# Patient Record
Sex: Male | Born: 1950 | Race: Black or African American | Hispanic: No | Marital: Single | Smoking: Former smoker
Health system: Southern US, Community
[De-identification: ages and names within clinical notes are randomized; demographics above are authoritative.]

## PROBLEM LIST (undated history)

## (undated) DIAGNOSIS — N185 Chronic kidney disease, stage 5: Secondary | ICD-10-CM

## (undated) DIAGNOSIS — I480 Paroxysmal atrial fibrillation: Secondary | ICD-10-CM

## (undated) DIAGNOSIS — E785 Hyperlipidemia, unspecified: Secondary | ICD-10-CM

## (undated) DIAGNOSIS — I120 Hypertensive chronic kidney disease with stage 5 chronic kidney disease or end stage renal disease: Secondary | ICD-10-CM

## (undated) DIAGNOSIS — E119 Type 2 diabetes mellitus without complications: Secondary | ICD-10-CM

## (undated) DIAGNOSIS — K219 Gastro-esophageal reflux disease without esophagitis: Secondary | ICD-10-CM

## (undated) DIAGNOSIS — I10 Essential (primary) hypertension: Secondary | ICD-10-CM

## (undated) DIAGNOSIS — M1990 Unspecified osteoarthritis, unspecified site: Secondary | ICD-10-CM

## (undated) HISTORY — PX: CATARACT EXTRACTION W/ INTRAOCULAR LENS IMPLANT: SHX1309

## (undated) HISTORY — PX: COLONOSCOPY: SHX174

## (undated) HISTORY — DX: Type 2 diabetes mellitus without complications: E11.9

## (undated) HISTORY — PX: BACK SURGERY: SHX140

---

## 1997-09-04 ENCOUNTER — Other Ambulatory Visit: Admission: RE | Admit: 1997-09-04 | Discharge: 1997-09-04 | Payer: Self-pay | Admitting: Family Medicine

## 1997-10-04 ENCOUNTER — Emergency Department (HOSPITAL_COMMUNITY): Admission: EM | Admit: 1997-10-04 | Discharge: 1997-10-04 | Payer: Self-pay | Admitting: Emergency Medicine

## 2000-08-15 ENCOUNTER — Encounter: Payer: Self-pay | Admitting: Family Medicine

## 2000-08-15 ENCOUNTER — Ambulatory Visit (HOSPITAL_COMMUNITY): Admission: RE | Admit: 2000-08-15 | Discharge: 2000-08-15 | Payer: Self-pay | Admitting: Family Medicine

## 2000-08-28 ENCOUNTER — Ambulatory Visit (HOSPITAL_COMMUNITY): Admission: RE | Admit: 2000-08-28 | Discharge: 2000-08-28 | Payer: Self-pay | Admitting: Family Medicine

## 2000-08-28 ENCOUNTER — Encounter: Payer: Self-pay | Admitting: Family Medicine

## 2000-11-07 ENCOUNTER — Emergency Department (HOSPITAL_COMMUNITY): Admission: EM | Admit: 2000-11-07 | Discharge: 2000-11-07 | Payer: Self-pay

## 2001-08-14 ENCOUNTER — Encounter: Payer: Self-pay | Admitting: Neurosurgery

## 2001-08-14 ENCOUNTER — Encounter: Admission: RE | Admit: 2001-08-14 | Discharge: 2001-08-14 | Payer: Self-pay | Admitting: Neurosurgery

## 2001-08-28 ENCOUNTER — Encounter: Admission: RE | Admit: 2001-08-28 | Discharge: 2001-08-28 | Payer: Self-pay | Admitting: Neurosurgery

## 2001-08-28 ENCOUNTER — Encounter: Payer: Self-pay | Admitting: Neurosurgery

## 2001-10-04 ENCOUNTER — Encounter: Payer: Self-pay | Admitting: Neurosurgery

## 2001-10-05 ENCOUNTER — Observation Stay (HOSPITAL_COMMUNITY): Admission: RE | Admit: 2001-10-05 | Discharge: 2001-10-06 | Payer: Self-pay | Admitting: Neurosurgery

## 2001-10-05 ENCOUNTER — Encounter: Payer: Self-pay | Admitting: Neurosurgery

## 2002-08-02 ENCOUNTER — Ambulatory Visit (HOSPITAL_COMMUNITY): Admission: RE | Admit: 2002-08-02 | Discharge: 2002-08-02 | Payer: Self-pay | Admitting: Internal Medicine

## 2002-08-02 ENCOUNTER — Encounter: Payer: Self-pay | Admitting: Internal Medicine

## 2002-08-20 ENCOUNTER — Ambulatory Visit (HOSPITAL_COMMUNITY): Admission: RE | Admit: 2002-08-20 | Discharge: 2002-08-20 | Payer: Self-pay | Admitting: Gastroenterology

## 2002-08-31 ENCOUNTER — Ambulatory Visit (HOSPITAL_COMMUNITY): Admission: RE | Admit: 2002-08-31 | Discharge: 2002-08-31 | Payer: Self-pay | Admitting: Neurosurgery

## 2002-08-31 ENCOUNTER — Encounter: Payer: Self-pay | Admitting: Neurosurgery

## 2003-02-20 ENCOUNTER — Ambulatory Visit (HOSPITAL_COMMUNITY): Admission: RE | Admit: 2003-02-20 | Discharge: 2003-02-20 | Payer: Self-pay | Admitting: Oncology

## 2003-04-24 ENCOUNTER — Encounter (INDEPENDENT_AMBULATORY_CARE_PROVIDER_SITE_OTHER): Payer: Self-pay | Admitting: Specialist

## 2003-04-24 ENCOUNTER — Other Ambulatory Visit: Admission: RE | Admit: 2003-04-24 | Discharge: 2003-04-24 | Payer: Self-pay | Admitting: Oncology

## 2003-05-14 ENCOUNTER — Encounter (INDEPENDENT_AMBULATORY_CARE_PROVIDER_SITE_OTHER): Payer: Self-pay | Admitting: Specialist

## 2003-05-14 ENCOUNTER — Ambulatory Visit (HOSPITAL_COMMUNITY): Admission: RE | Admit: 2003-05-14 | Discharge: 2003-05-14 | Payer: Self-pay | Admitting: Oncology

## 2003-12-10 ENCOUNTER — Encounter: Admission: RE | Admit: 2003-12-10 | Discharge: 2003-12-10 | Payer: Self-pay | Admitting: Nephrology

## 2004-03-03 ENCOUNTER — Ambulatory Visit: Payer: Self-pay | Admitting: Nurse Practitioner

## 2004-05-05 ENCOUNTER — Encounter: Admission: RE | Admit: 2004-05-05 | Discharge: 2004-05-05 | Payer: Self-pay | Admitting: Nephrology

## 2004-05-14 ENCOUNTER — Ambulatory Visit: Payer: Self-pay | Admitting: Oncology

## 2004-07-01 ENCOUNTER — Encounter: Admission: RE | Admit: 2004-07-01 | Discharge: 2004-07-01 | Payer: Self-pay | Admitting: Nephrology

## 2005-02-15 ENCOUNTER — Ambulatory Visit (HOSPITAL_COMMUNITY): Admission: RE | Admit: 2005-02-15 | Discharge: 2005-02-15 | Payer: Self-pay | Admitting: Nephrology

## 2005-02-18 ENCOUNTER — Ambulatory Visit (HOSPITAL_COMMUNITY): Admission: RE | Admit: 2005-02-18 | Discharge: 2005-02-18 | Payer: Self-pay | Admitting: Nephrology

## 2005-02-22 ENCOUNTER — Ambulatory Visit (HOSPITAL_COMMUNITY): Admission: RE | Admit: 2005-02-22 | Discharge: 2005-02-22 | Payer: Self-pay | Admitting: Nephrology

## 2005-05-02 ENCOUNTER — Emergency Department (HOSPITAL_COMMUNITY): Admission: EM | Admit: 2005-05-02 | Discharge: 2005-05-02 | Payer: Self-pay | Admitting: Emergency Medicine

## 2005-05-18 ENCOUNTER — Emergency Department (HOSPITAL_COMMUNITY): Admission: EM | Admit: 2005-05-18 | Discharge: 2005-05-19 | Payer: Self-pay | Admitting: Emergency Medicine

## 2005-06-06 ENCOUNTER — Ambulatory Visit (HOSPITAL_COMMUNITY): Admission: RE | Admit: 2005-06-06 | Discharge: 2005-06-06 | Payer: Self-pay | Admitting: Gastroenterology

## 2005-06-06 ENCOUNTER — Encounter (INDEPENDENT_AMBULATORY_CARE_PROVIDER_SITE_OTHER): Payer: Self-pay | Admitting: *Deleted

## 2006-02-21 ENCOUNTER — Emergency Department (HOSPITAL_COMMUNITY): Admission: EM | Admit: 2006-02-21 | Discharge: 2006-02-21 | Payer: Self-pay | Admitting: Emergency Medicine

## 2006-03-31 ENCOUNTER — Encounter: Admission: RE | Admit: 2006-03-31 | Discharge: 2006-03-31 | Payer: Self-pay | Admitting: Nephrology

## 2007-03-11 IMAGING — CR DG KNEE 1-2V*R*
3 series · 3 of 3 positions shown · non-contrast
Comparison: None.

RIGHT KNEE - 2 VIEW:

CLINICAL DATA: Right knee pain

[t knee ap right]
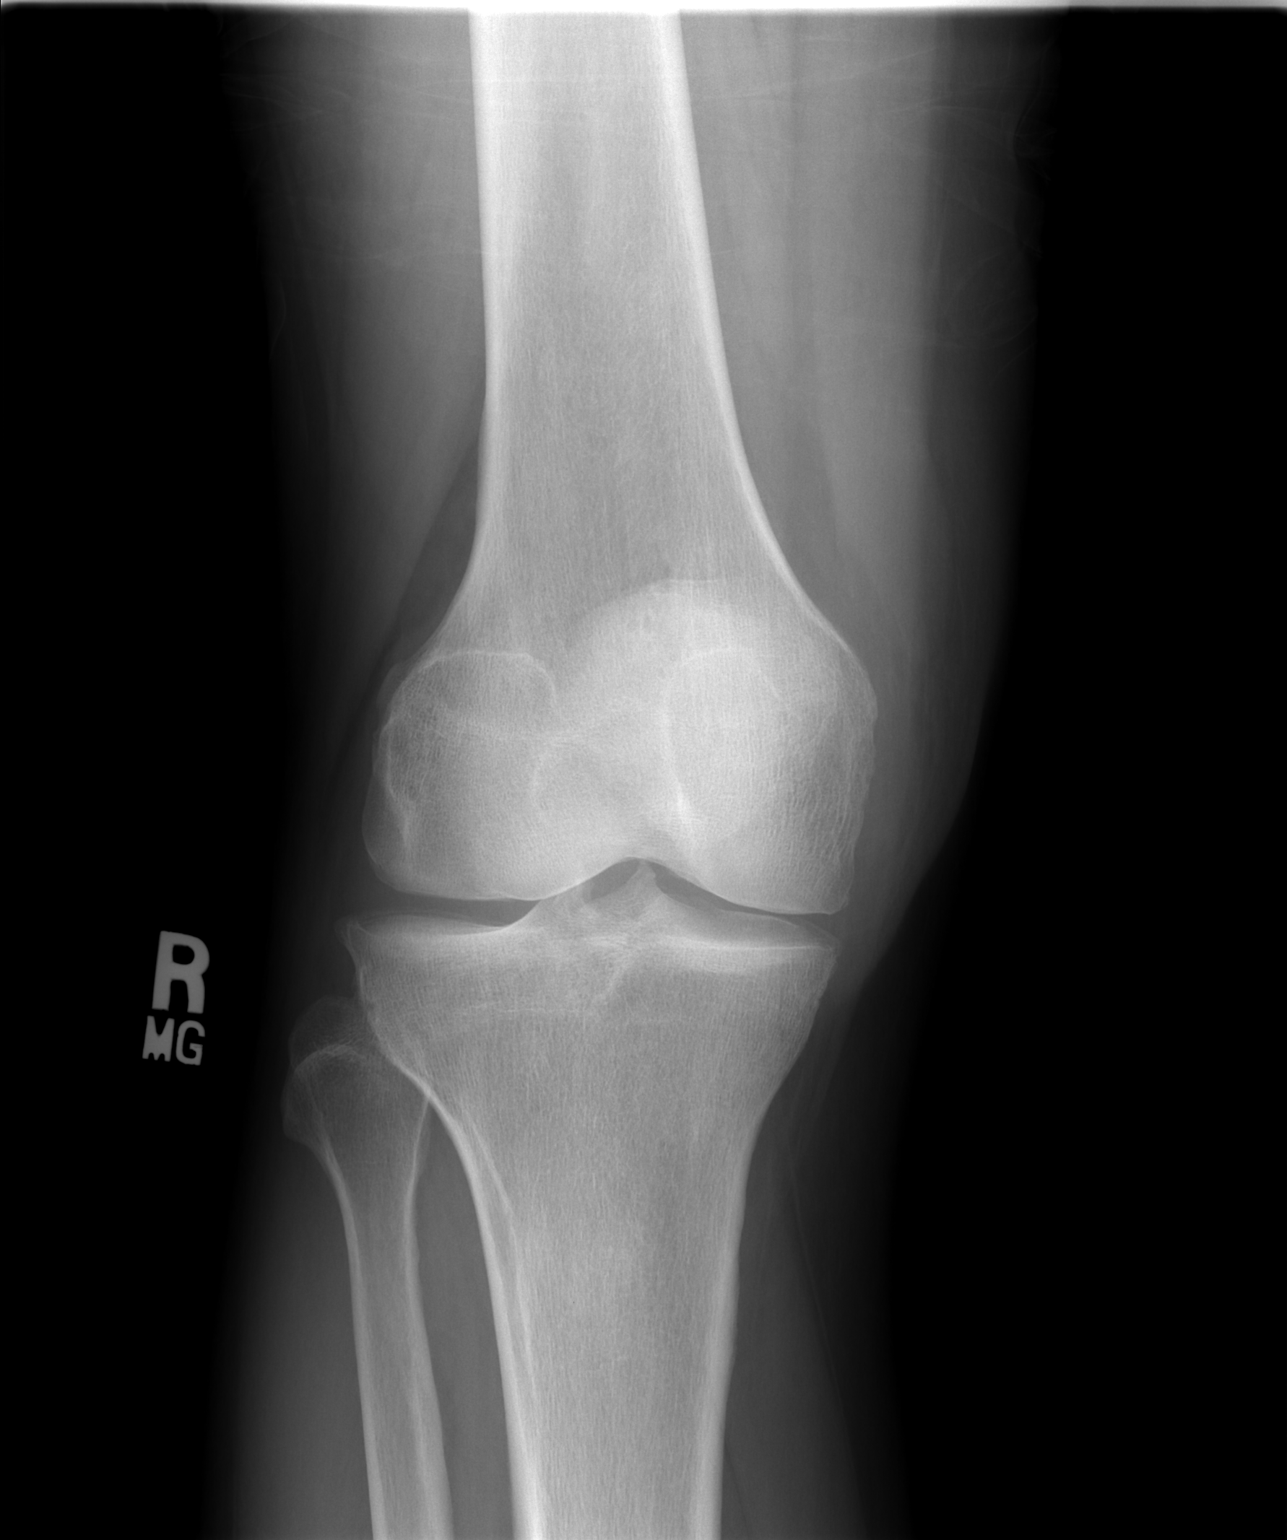

[t knee lat right (1 of 2)]
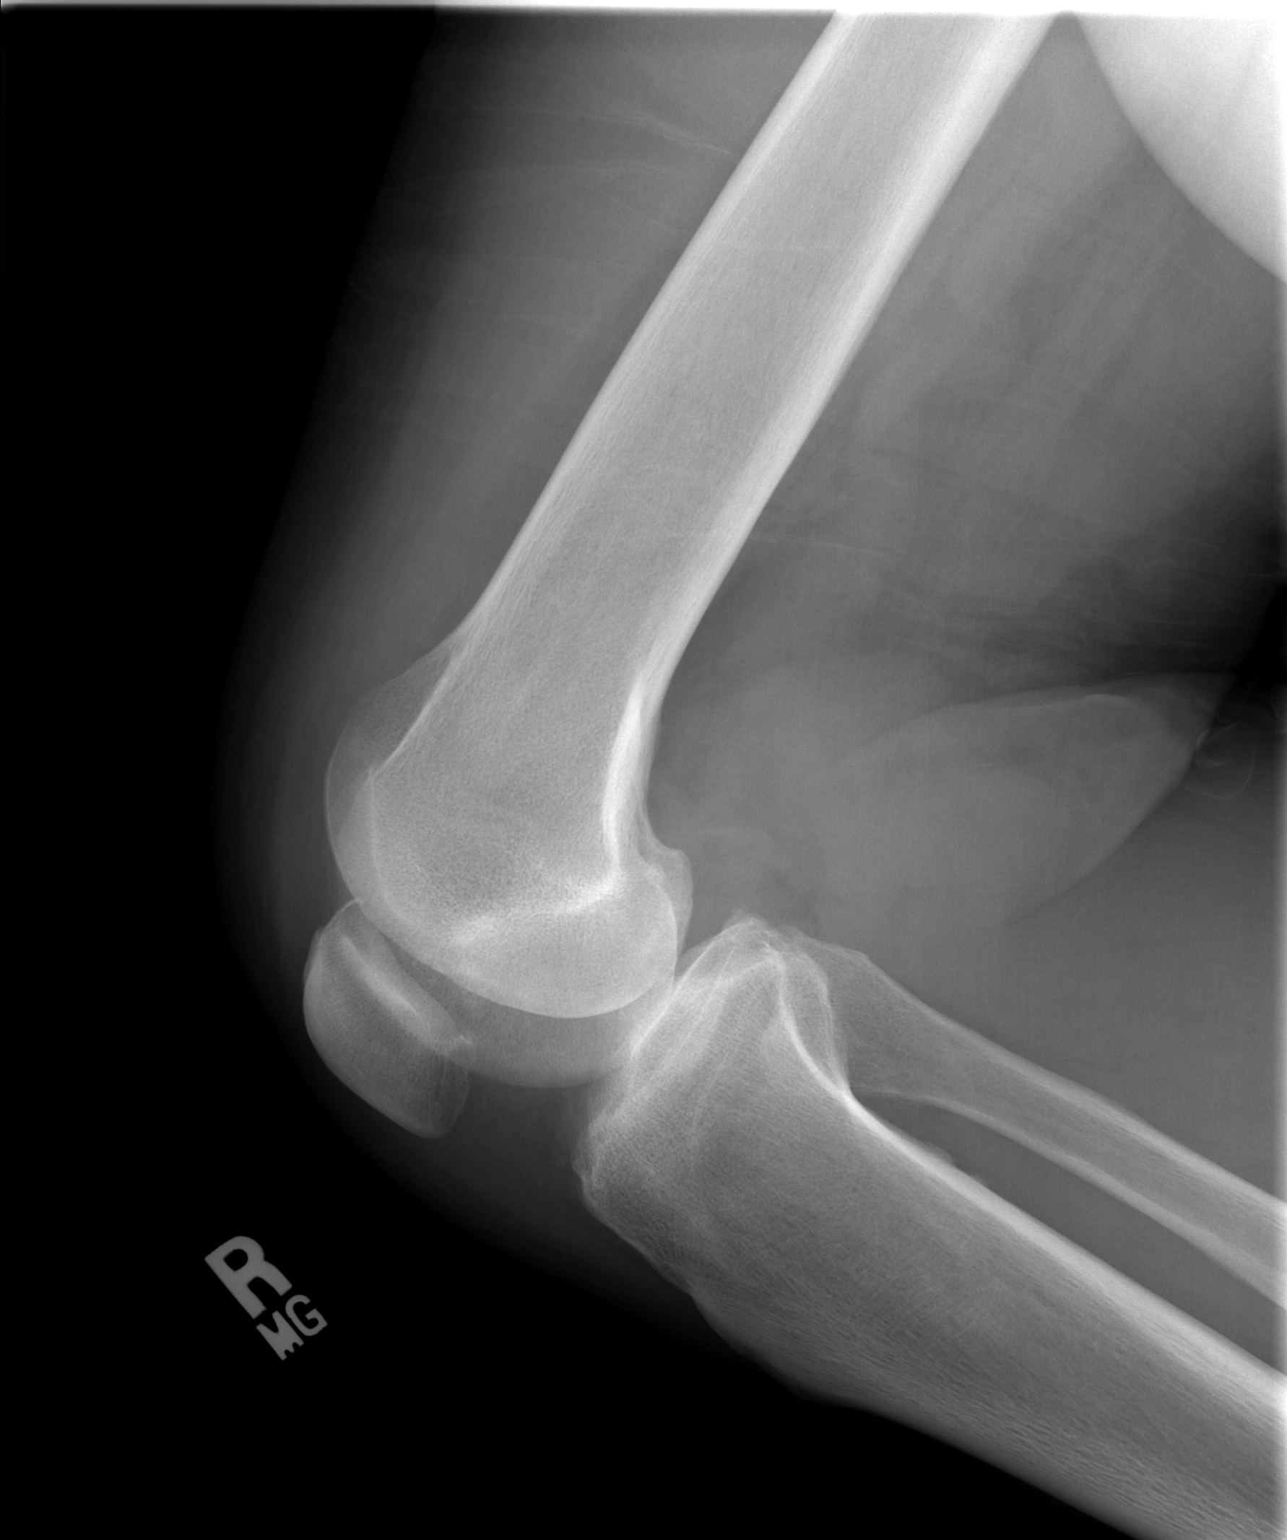

[t knee lat right (2 of 2)]
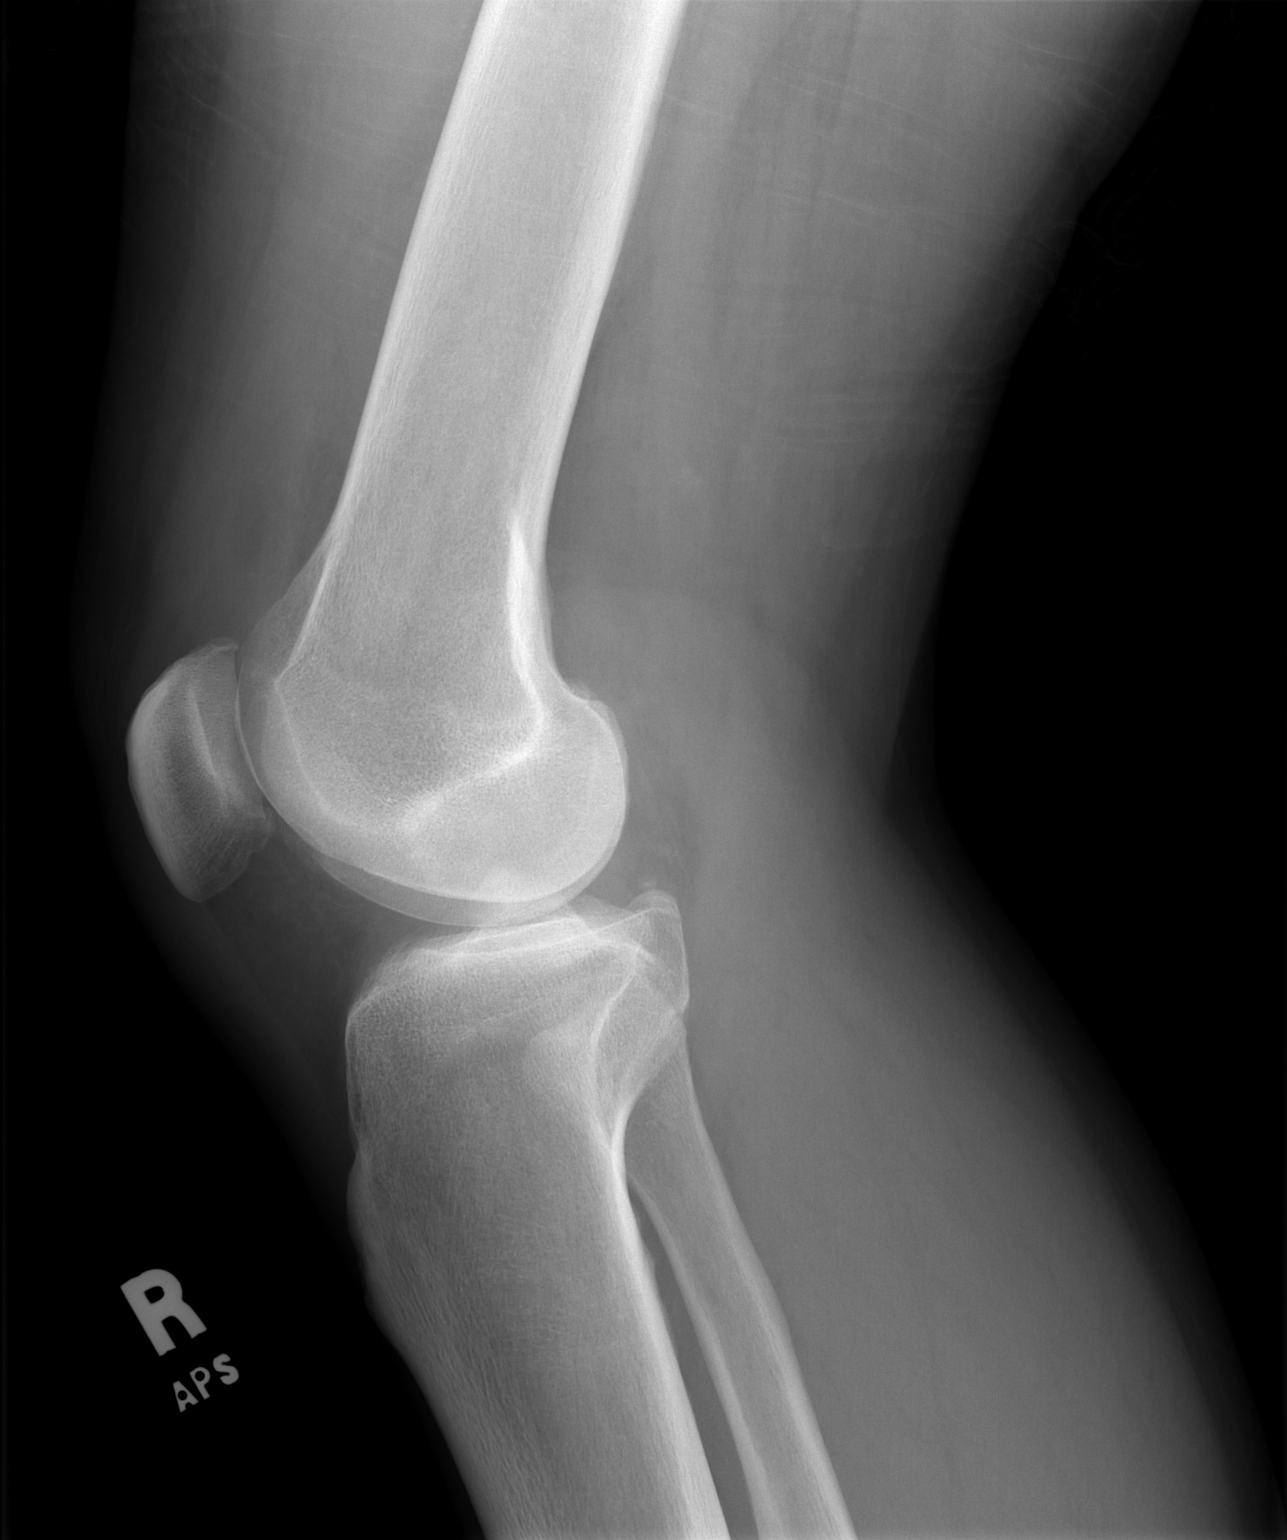

[3 of 3 positions shown; findings below may reference images not displayed]

FINDINGS: No evidence for acute fracture. No joint effusion. Tricompartmental
spurring is apparent.
IMPRESSION: Tricompartmental degenerative change. No acute bony findings.

## 2007-04-30 ENCOUNTER — Encounter: Admission: RE | Admit: 2007-04-30 | Discharge: 2007-04-30 | Payer: Self-pay | Admitting: Nephrology

## 2007-06-22 ENCOUNTER — Inpatient Hospital Stay (HOSPITAL_COMMUNITY): Admission: EM | Admit: 2007-06-22 | Discharge: 2007-06-27 | Payer: Self-pay | Admitting: Emergency Medicine

## 2007-06-26 ENCOUNTER — Encounter (INDEPENDENT_AMBULATORY_CARE_PROVIDER_SITE_OTHER): Payer: Self-pay | Admitting: Interventional Radiology

## 2007-12-06 ENCOUNTER — Inpatient Hospital Stay (HOSPITAL_COMMUNITY): Admission: EM | Admit: 2007-12-06 | Discharge: 2007-12-10 | Payer: Self-pay | Admitting: Emergency Medicine

## 2008-01-17 ENCOUNTER — Emergency Department (HOSPITAL_COMMUNITY): Admission: EM | Admit: 2008-01-17 | Discharge: 2008-01-17 | Payer: Self-pay | Admitting: Emergency Medicine

## 2008-03-06 ENCOUNTER — Emergency Department (HOSPITAL_COMMUNITY): Admission: EM | Admit: 2008-03-06 | Discharge: 2008-03-06 | Payer: Self-pay | Admitting: Emergency Medicine

## 2008-12-11 ENCOUNTER — Encounter: Admission: RE | Admit: 2008-12-11 | Discharge: 2008-12-11 | Payer: Self-pay | Admitting: Nephrology

## 2008-12-27 IMAGING — CR DG CHEST 2V
2 series · 2 of 2 positions shown · non-contrast
Comparison: 12/06/2007

CLINICAL DATA: Shortness of breath

CHEST - 2 VIEW

[w chest pa]
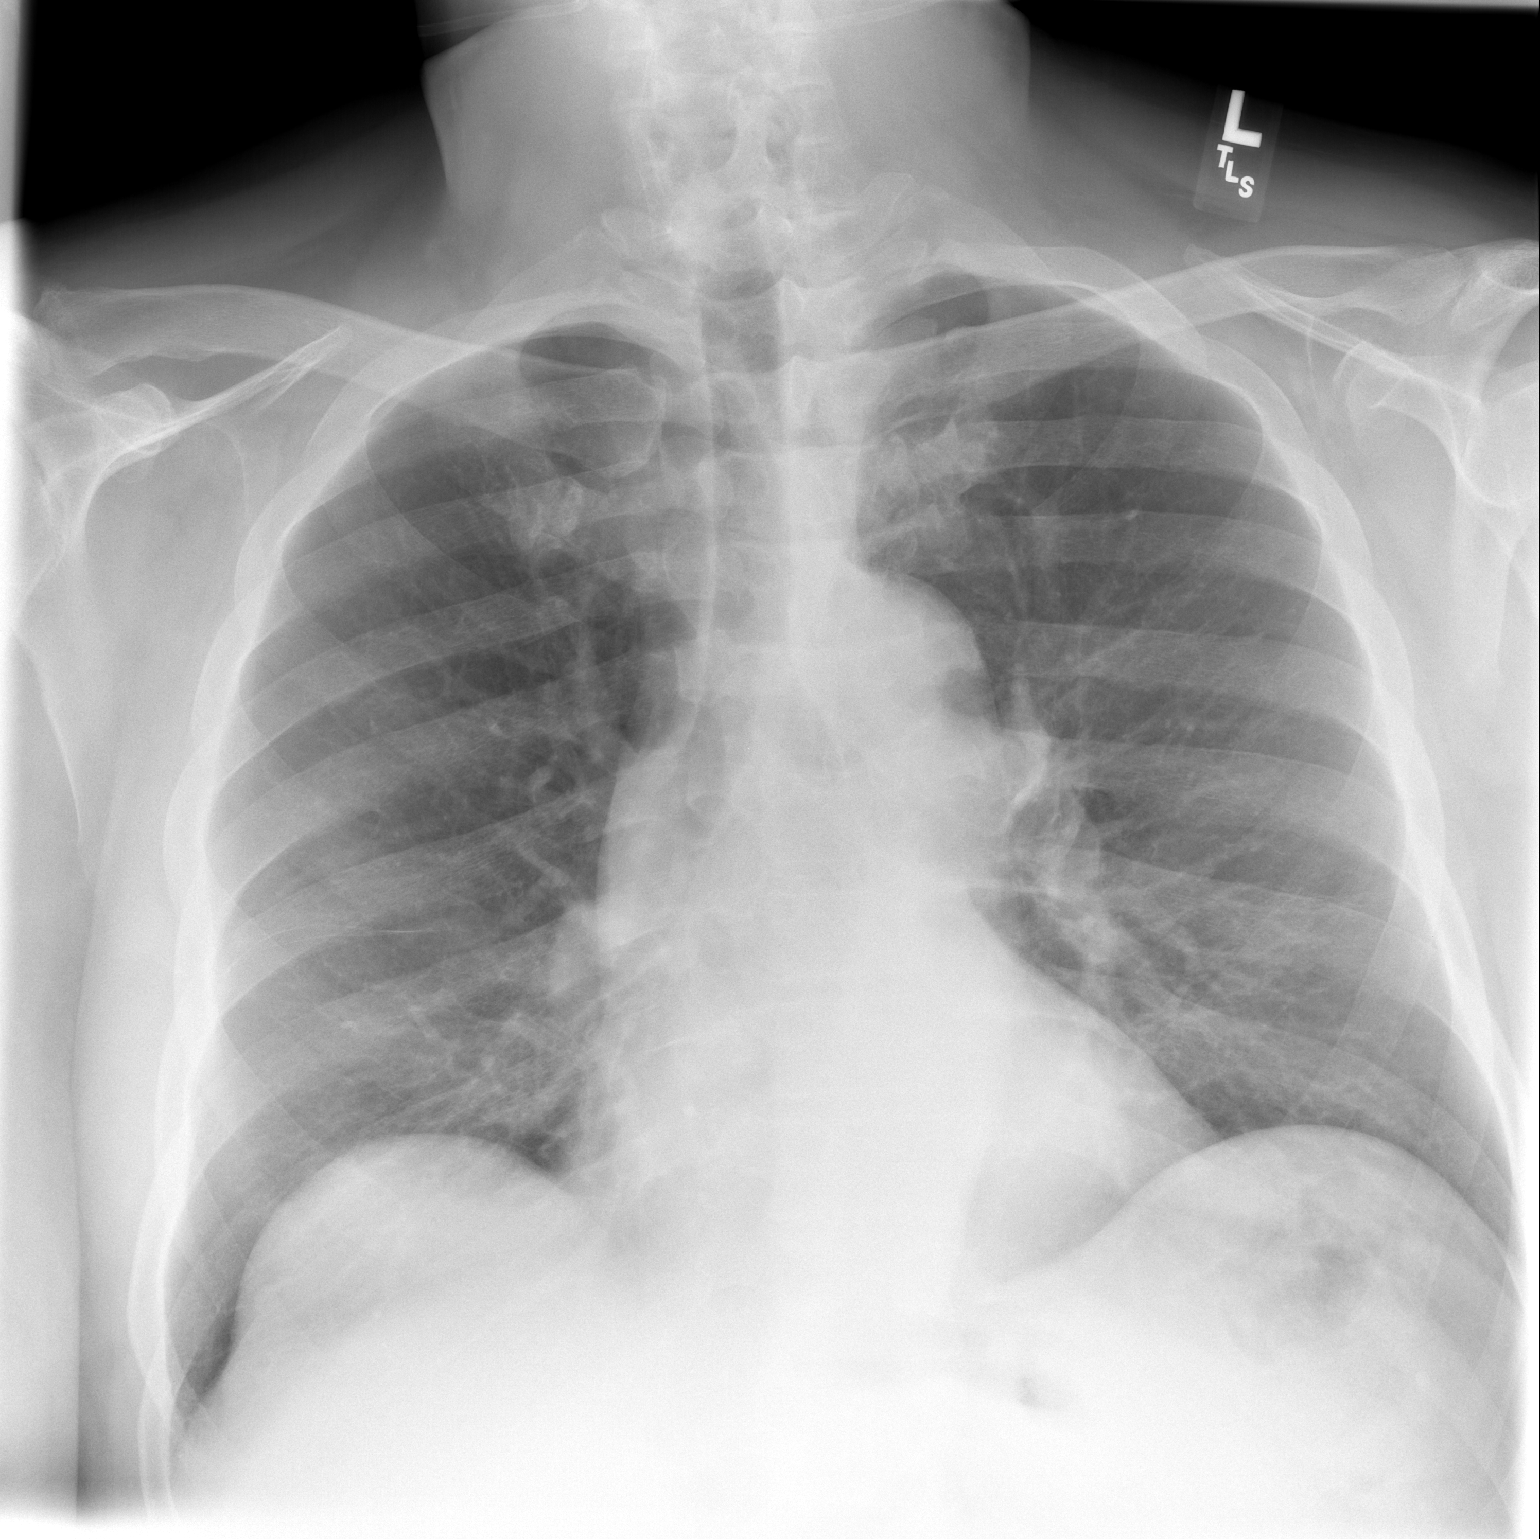

[w chest lat *]
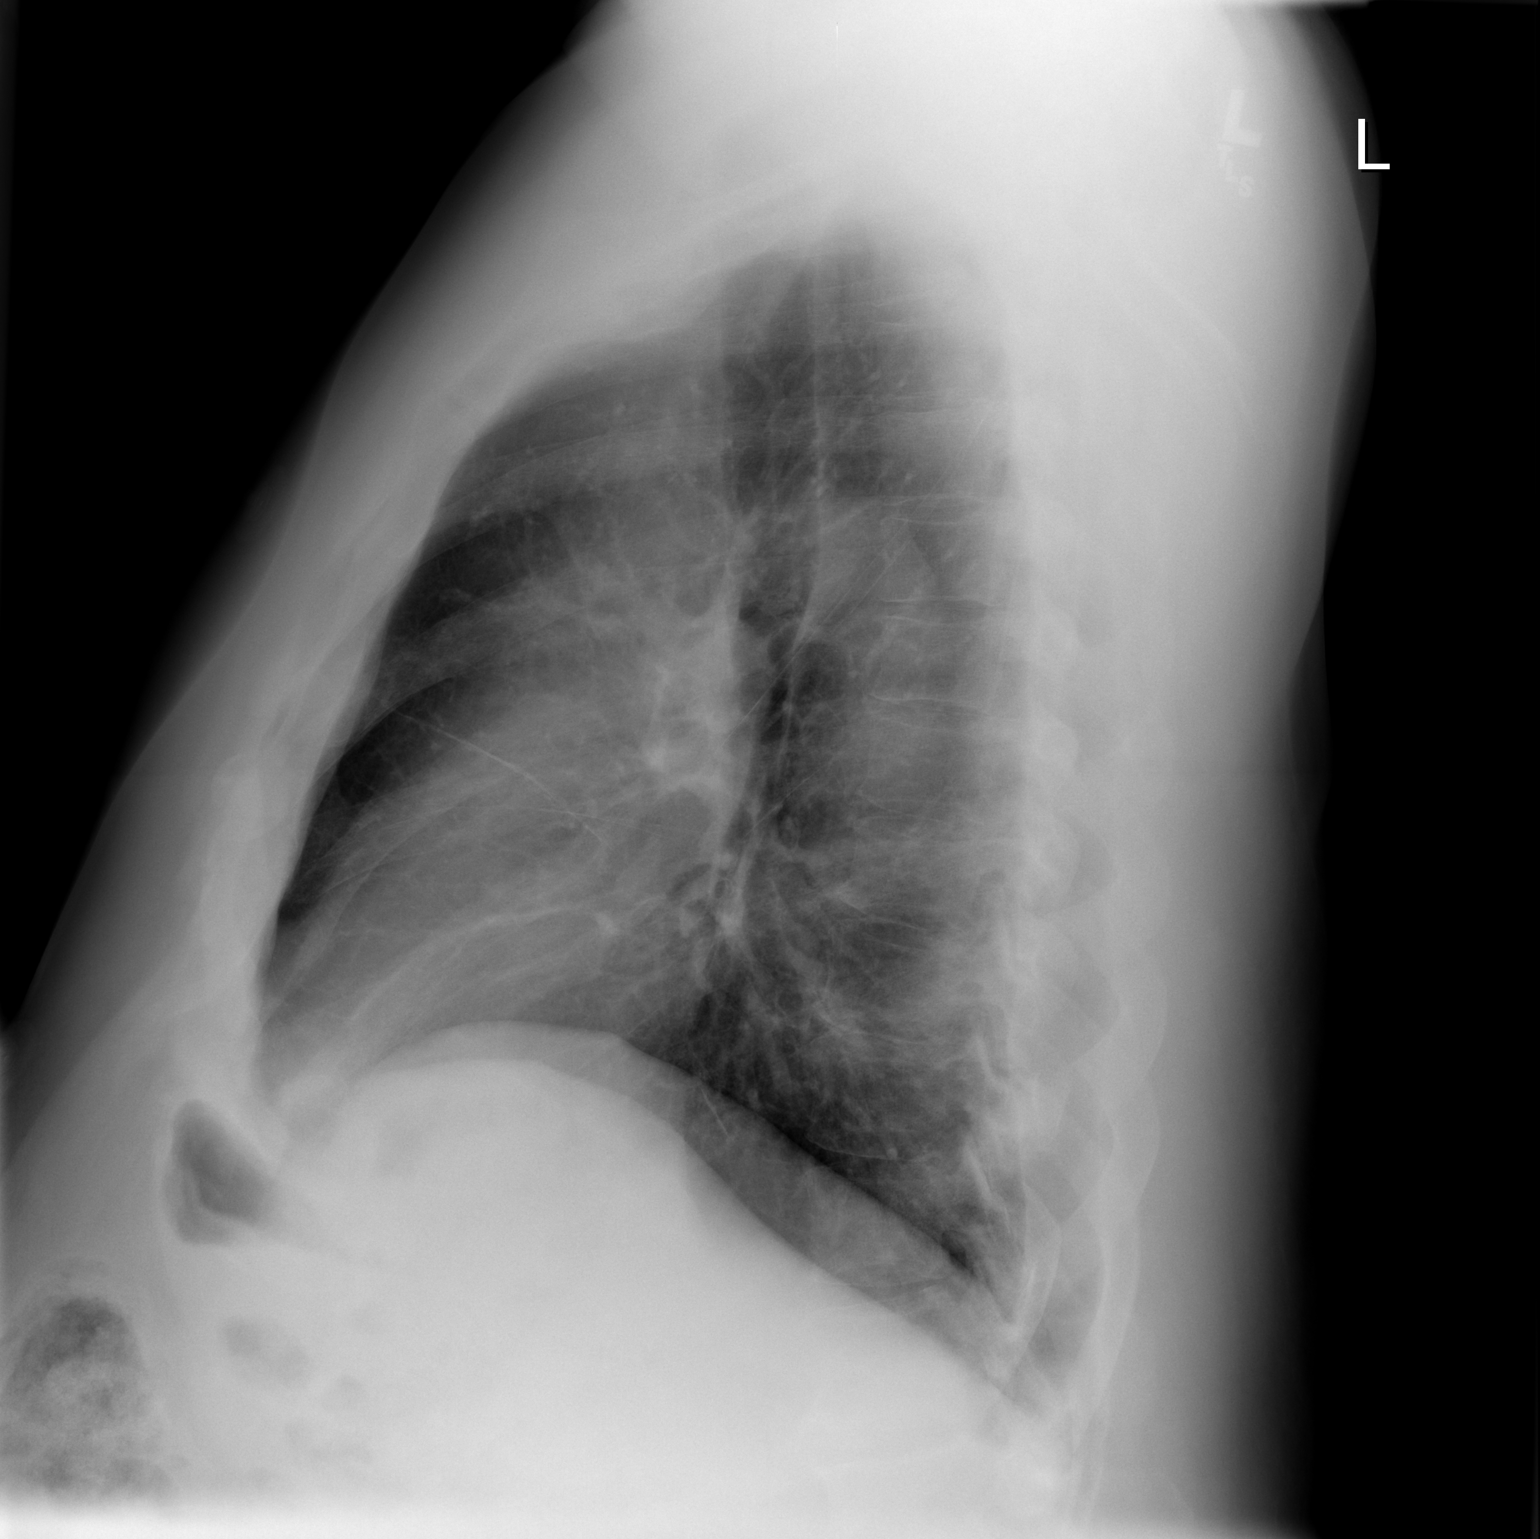

[2 of 2 positions shown; findings below may reference images not displayed]

FINDINGS: The cardiac silhouette, mediastinal and hilar contours
are stable.  There is tortuosity and ectasia of the thoracic aorta.
There are mild chronic lung changes but no acute pulmonary
findings.  Bony thorax is intact.
IMPRESSION: 1.  Chronic lung changes.
2.  No acute pulmonary findings.

## 2009-07-23 ENCOUNTER — Encounter: Admission: RE | Admit: 2009-07-23 | Discharge: 2009-07-23 | Payer: Self-pay | Admitting: Nephrology

## 2010-02-28 ENCOUNTER — Encounter: Payer: Self-pay | Admitting: Nephrology

## 2010-04-27 ENCOUNTER — Other Ambulatory Visit: Payer: Self-pay | Admitting: Nephrology

## 2010-04-27 DIAGNOSIS — N289 Disorder of kidney and ureter, unspecified: Secondary | ICD-10-CM

## 2010-04-28 ENCOUNTER — Ambulatory Visit
Admission: RE | Admit: 2010-04-28 | Discharge: 2010-04-28 | Disposition: A | Discharge status: Home / Self Care | Payer: Medicare Other | Source: Ambulatory Visit | Attending: Nephrology | Admitting: Nephrology

## 2010-04-28 DIAGNOSIS — N289 Disorder of kidney and ureter, unspecified: Secondary | ICD-10-CM

## 2010-06-22 NOTE — Discharge Summary (Signed)
NAME:  Miguel [name], Miguel [name] NO.:  [id number]   MEDICAL RECORD NO.:  [id number]          PATIENT TYPE:  INP   LOCATION:  3708                         FACILITY:  [facility]   PHYSICIAN:  Jarome Matin, M.D.DATE OF BIRTH:  September 17, 1950   DATE OF ADMISSION:  12/06/2007  DATE OF DISCHARGE:  12/10/2007                               DISCHARGE SUMMARY   ADMITTING DIAGNOSES:  Chest tightness and shortness of breath.   DISCHARGE DIAGNOSES:  1. Chest tightness.  2. Atrial fibrillation.  3. Shortness of breath.  4. Acute on chronic renal failure.  5. Hypo-osmolality.  6. Type 2 diabetes mellitus.  7. Congestive heart failure.  8. Constipation.  9. Chronic renal disease.  10.Hypertensive renal disease.  11.Osteoarthritis.  12.Reflux esophagitis.   BRIEF HISTORY AND PHYSICAL AND HOSPITAL COURSE:  [name] is a 57-year-  old African American male with hypertension and chronic renal disease.  He lives at home alone.  He has a history of chest tightness and  shortness of breath.  He was brought to the emergency room by EMS.  He  was found to be in atrial fibrillation.  He received adenosine 12 mg IV  and converted to normal sinus rhythm.  Chest pain improved.  He has  diabetes.  He had a brother, who was on dialysis, who had chronic renal  disease and diabetes, who has deceased.  The patient had some evidence  of congestive failure and diabetes.  He had been on metformin,  lisinopril, Lasix, Oruvail for his arthritis, some Reglan for his  reflux.  Physical exam reveals a blood pressure of 151/105, temp of  97.6, pulse of 104, respirations 16, and he was given some Lasix, and he  was feeling better at the time we examined.  His chest pain was down.  His shortness of breath was improved.  His chest was clear, had  occasional rhonchi.  Abdomen was soft.  No masses.  No organomegaly.  He  had active bowel sounds.  He can move all his extremities.   PERTINENT LABORATORY DATA:   Sodium was 129.  We typed him for CK-MB;  however, that was normal.  Increased troponins.  Cardiac markers, he did  have __________ his CK would be elevated.  Some muscle pain.  He  frequently had slightly elevated uric acids, so we kept him on Zyloprim  and colchicine.  His BUN was 37 and creatinine was 2.6.  His white count  was 6.7.  We did a protein electrophoresis that showed monoclonal peak  in his protein.  The patient was found to have an ejection fraction of  about 54%.  He did not have an acute MI, so we decided we can go ahead  and discharge him home.  He was on metformin 500 mg daily earlier.  We  are going to switch him off metformin since he has some renal disease.  Amitriptyline 50 mg at bedtime, Reglan 10 mg before  meals and at bedtime, tramadol 50 mg once daily, allopurinol 100 mg  b.i.d., colchicine 0.6 mg daily, ramipril 1.25 mg daily, lisinopril 20  mg daily, furosemide 20 mg daily.  He was put on metoprolol 25 mg  b.i.d., and the patient was discharged home on these meds to be seen in  the office in 2 weeks.           ______________________________  Jarome Matin, M.D.     CEF/MEDQ  D:  01/14/2008  T:  01/15/2008  Job:  [id number]

## 2010-06-25 NOTE — Discharge Summary (Signed)
NAME:  GUILHERME, SCHWENKE NO.:  [id number]   MEDICAL RECORD NO.:  [id number]          PATIENT TYPE:  INP   LOCATION:  6735                         FACILITY:  [facility]   PHYSICIAN:  Jarome Matin, M.D.DATE OF BIRTH:  1950/08/23   DATE OF ADMISSION:  06/22/2007  DATE OF DISCHARGE:  06/27/2007                               DISCHARGE SUMMARY   ADMITTING DIAGNOSIS:  Dizziness while driving and almost blacking out.   DISCHARGE DIAGNOSES:  1. Hypertension.  2. Diabetes mellitus type 2.  3. Gouty arthritis.  4. Reflux esophagitis.  5. Chronic renal disease.   BRIEF HISTORY AND PHYSICAL AND HOSPITAL COURSE:  Mr. Theil is a 60-year-  old African American male who states that he felt very dizzy while  driving his automobile.  He did not have chest pain.  He almost blacked  out.  He went to the emergency room.  He was found to have a blood  pressure of 56/39.  Well, actually I think he must have gone home  because he was brought to the emergency room in an ambulance.  The  patient is divorced.  He has a history of diabetes, hypertension, and  gouty arthritis.  He had a brother who was on dialysis, had deceased.  He lives in a house and has some housemates.   PHYSICAL EXAMINATION:  VITAL SIGNS:  Temperature 97.3, pulse rate 84,  blood pressure 56/37, and respirations 28, and then down to 12.  HEENT:  He had a very poor dentition.  NECK:  Neck veins were flat.  CHEST:  Clear to auscultation and percussion.  CARDIAC:  Regular sinus rhythm.  ABDOMEN:  Soft.  No mass.  No organomegaly.  He had bowel sounds.  EXTREMITIES:  Could move all the extremities and reflexes are  bilaterally equal.   LABORATORY DATA:  BUN was 41, creatinine of 3.91, hemoglobin 11.5.  He  had no leg edema.   He had been on meds for hypertension; Catapres, Lasix, and lisinopril.  For diabetes, he had been on metformin; however, with elevation of his  creatinine, we are going to have to change that.   Also for gout, he has  been on allopurinol and colchicine.  He has meds for arthritis,  Clinoril, and Reglan for his reflux esophagitis.  Put him on bed rest  with his blood pressure down until his pressure came up and stopped all  of his blood pressure medicines.  Gradually, his pressure came up  118/65, pulse of 70, respirations 18, and O2 sats 97.  Hemoglobin 11.5,  hematocrit 33.5, and white count was 6.7.  His creatinine went up to  4.01, BUN of 44, uric acid 7.7.  Gradually, the patient started voiding  better and his eating improved.  His renal function is stable.  We are  going to get a creatinine clearance and a renal biopsy.  Gradually, the  patient became more alert and oriented.  Felt better and was eating  better.  We stopped all of his pressors.  As his blood pressure came up,  his renal function improved, went up to  4.01 creatinine and now it was  back down to 2.15.  His clearance we got was about 44 mL per minute and  went up to near 50 mL per minute.  Temperature 98, pulse 67,  respirations 18, blood pressure 137/93, and 96% on room air.  Ultrasound  of the patient's kidneys and __________ were normal.  We told him we  would go ahead and get a renal biopsy while the patient was in the  hospital.  He had a biopsy with ultrasound-guided biopsy  lower pole of  left kidney.  He had a small complex cyst which showed possibly cystic  mass in the right kidney.  Probably get an ultrasound in 3-6 months of  that right kidney.  Gradually the patient was feeling fine.  His BUN was  down to 22, creatinine down to 1.89, hemoglobin 11.9, hematocrit of  34.8, and white count 7.2.  The patient was hydrated, his renal function  was about back at baseline, and we have decided to go ahead and get the  results of his renal biopsy as an outpatient.  We discharged the patient  home on Reglan and Prilosec for his reflux, and no blood pressure meds  at this time.  We will see him back in the  office in 2-3 weeks.           ______________________________  Jarome Matin, M.D.     CEF/MEDQ  D:  08/22/2007  T:  08/23/2007  Job:  [id number]

## 2010-06-25 NOTE — Op Note (Signed)
NAME:  Miguel [name], Miguel [name] NO.:  [id number]   MEDICAL RECORD NO.:  [id number]          PATIENT TYPE:  AMB   LOCATION:  ENDO                         FACILITY:  [facility]   PHYSICIAN:  Anselmo Rod, M.D.  DATE OF BIRTH:  [birth year]   DATE OF PROCEDURE:  06/06/2005  DATE OF DISCHARGE:                                 OPERATIVE REPORT   PROCEDURE PERFORMED:  Colonoscopy with snare polypectomy x6 and cold  biopsies x3.   ENDOSCOPIST:  Anselmo Rod, M.D.   INSTRUMENT USED:  Olympus video colonoscope.   INDICATIONS FOR PROCEDURE:  The patient is a 60 year old African-American  male with a history of abnormal weight loss and rectal bleeding with chronic  constipation undergoing a screening colonoscopy to rule out colonic polyps,  masses, etc.   PREPROCEDURE PREPARATION:  Informed consent was procured from the patient.  The patient was fasted for four hours prior to the procedure and prepped  with a bottle of magnesium citrate and a gallon of GoLytely the night prior  to the procedure.  The risks and benefits of the procedure including a 10%  miss rate for cancer or polyps was discussed with the patient as well.   PREPROCEDURE PHYSICAL:  The patient had stable vital signs.  Neck supple.  Chest clear to auscultation.  S1 and S2 regular.  Abdomen soft with normal  bowel sounds.   DESCRIPTION OF PROCEDURE:  The patient was placed in left lateral decubitus  position and sedated with 75 mcg of fentanyl and 7.5 mg of Versed in slow  incremental doses.  Once the patient was adequately sedated and maintained  on low flow oxygen and continuous cardiac monitoring, the Olympus video  colonoscope was advanced from the rectum to the cecum with extreme  difficulty.  The patient's position was changed from the left lateral to the  supine and the right lateral position to mobilize the large amount of stool  especially in the right colon and cecum.  With gentle application of  abdominal pressure we reached the cecal base.  The appendicular orifice and  ileocecal valve were visualized and photographed.  The terminal ileum  appeared normal.  There was no evidence of diverticulosis.  Multiple washes  were done.  Small lesions could be missed.  Six small sessile polyps were  removed by hot snare from the rectum along with three other polyps removed  by cold biopsy.  The settings were 20/200.  The patient tolerated the  procedure well without complication.  Prep was suboptimal and therefore  small lesions could have been missed.   IMPRESSION:  1.  Six small sessile polyps removed by hot snare from the rectum.  2.  Three small sessile polyps biopsied from the rectum.  3.  Large amount of residual stool in the colon.  Small lesions could have      been missed.  Normal-appearing transverse colon, right colon and cecum.   RECOMMENDATIONS:  1.  Await pathology results.  2.  Avoid all nonsteroidals including aspirin for the next two weeks.  3.  Outpatient followup in the next two  weeks for further recommendations.      Anselmo Rod, M.D.  Electronically Signed     JNM/MEDQ  D:  06/06/2005  T:  06/07/2005  Job:  [id number]   cc:   Jarome Matin, M.D.  Fax: [phone]

## 2010-06-25 NOTE — Op Note (Signed)
NAME:  RAEKWON, Miguel [name]                        ACCOUNT NO.:  [id number]   MEDICAL RECORD NO.:  [id number]                   PATIENT TYPE:  INP   LOCATION:  3013                                 FACILITY:  [facility]   PHYSICIAN:  Kyle L. Franky Macho, M.D.               DATE OF BIRTH:  Nov 19, 1950   DATE OF PROCEDURE:  10/05/2001  DATE OF DISCHARGE:  10/06/2001                                 OPERATIVE REPORT   PREOPERATIVE DIAGNOSES:  1. Displaced cyst, right far lateral, L4-L5.  2. L5 radiculopathy.   POSTOPERATIVE DIAGNOSES:  1. Displaced cyst, right far lateral, L4-L5.  2. L5 radiculopathy.   PROCEDURE:  L4-L5 far lateral diskectomy with microdissection.   COMPLICATIONS:  None.   SURGEON:  Kyle L. Franky Macho, M.D.   ASSISTANT:  Hewitt Shorts, M.D.   INDICATIONS:  Miguel [name] is a patient whom I have followed for a number  of months.  He has severe pain in his right lower extremity and a far  lateral disk herniation at L4-L5 closing off the neural foramen.  I  recommended and he has agreed to undergo operative decompression.   ANESTHESIA:  General endotracheal.   OPERATIVE NOTE:  Mr. Sprigg was brought to the operating room, intubated and  placed under general anesthesia without difficulty.  He was rolled into a  prone position and his back was prepped and he was draped in a sterile  position.  Using preoperative x-ray, I infiltrated 10 cc of 0.5% lidocaine,  1:200,000 strength epinephrine into my proposed incision.  I made the  incision with a #10 blade and took it down to the thoracolumbar fascia.  I  then exposed the lamina of L5.  I took an x-ray and showed I was at the  correct location.  I then identified the pars intra-articularis of L4.  Using a high-speed drill I then enlarged that opening and removed bone so  that I was able to see the L4 nerve root.  Once the L4 nerve root was  identified, I brought the microscope in to aid in microscopic dissection.  Dr.  Newell Coral then assisted.   I then entered the disk space and removed disk material and also removed  some disk which was herniated.  I used a drill to remove an osteophyte which  was pushing on the L4 nerve root.  I did this both proximally and distally  until I felt that there was no compression of the nerve root.  Dr. Newell Coral  also drilled and used the surgical dynamics down instrument to remove an  osteophyte which was compressing a nerve root.  After that was done I was  satisfied with the decompression.  I then irrigated.  I then placed Depo-  Medrol into the operative site.  I then closed the wound in a layered  fashion using Vicryl sutures with the assisted of Dr. Newell Coral, the  thoracolumbar fascia initially  then the subcutaneous tissue.  The skin was reapproximated with Vicryl  sutures.  Dermabond used for a sterile dressing.  The patient tolerated the  procedure moving all extremities well postoperatively after being rolled  supine and extubated.                                               Kyle L. Franky Macho, M.D.    Luna Kitchens  D:  10/05/2001  T:  10/09/2001  Job:  [id number]

## 2010-06-25 NOTE — H&P (Signed)
NAME:  Miguel [name], Miguel [name]                        ACCOUNT NO.:  [id number]   MEDICAL RECORD NO.:  [id number]                   PATIENT TYPE:  INP   LOCATION:  3013                                 FACILITY:  [facility]   PHYSICIAN:  Kyle L. Franky Macho, M.D.               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   INDICATIONS:  The patient originally presented in October 2002.  He is a 60-  year-old gentleman who had pain in both legs which he had had since July.  He had difficulty walking.  He uses a wheelchair at times when he is at  home.  He also uses a walker when he is at home.  He has not been able to  work because he cannot walk any distance.  He finds himself weak in both  legs.  He finds that his right toes are numb.  He has had no bowel or  bladder difficulties.   PAST MEDICAL HISTORY:  Hypertension, cerebrovascular disease, one bout of  acute renal failure and he was on dialysis for a short period of time.  No  previous operations.  He does not have drug allergies.   FAMILY HISTORY:  Mother, 79, is in fair health.   SOCIAL HISTORY:  He smokes a pack of cigarettes a day.  Does drink alcohol  daily.  Lost weight recently.   REVIEW OF SYSTEMS:  He denies constitutional, eyes, ears, nose, throat,  mouth, cardiovascular, respiratory, gastrointestinal, genitourinary, skin,  neurological, psychiatric, endocrine, hematologic and allergic problems.   PHYSICAL EXAMINATION:  EXTREMITIES:  He cannot toe walk.  He is unable to  heel walk on the left.  He can do single toe raises on the right though with  difficulty.  Markedly antalgic gait.  Somewhat unsteady on his feet.  5/5  strength on manual testing.  Pain with forced flexion and extension of the  hip.  No pain with passive movement.  No pain on Patrick's maneuver.  Negative straight leg raising.  There are 2+ reflexes at the knees and  ankles.  Downgoing toes.  Plantar simulation.  No clonus.  No Hoffman sign.  There are 2+ reflexes in the upper extremities.  Normal muscle tone and  bulk.  No cervical masses or bruits.  HEENT:  Pupils equal, round and reactive to light.  Full extraocular  movements.  Tongue and uvula in the midline.  Shoulder shrug is normal.  Hearing intact to voice bilaterally.  Symmetric facial sensation and  movements.   ASSESSMENT ON LIMB MOVEMENT:  The patient came back in August stating that  he is just having a lot of problems now and that the orthopedic surgeons do  not believe hip problems are significant.  I believe the pain  is in the  lower extremities caused to some degree by the disc herniation.  He has had  epidural injections which has helped to some degree though he did not get  rid of his pain.  That being the reason,  I have planned on performing a  __________  diskectomy at L4-5 and decompression.  The risks of the  procedure including bleeding, infection, no pain relief, bowel or bladder  dysfunction, as well as the need for further surgery were discussed.  He  understands and wishes to proceed.                                               Kyle L. Franky Macho, M.D.    Miguel [name]  D:  10/05/2001  T:  10/08/2001  Job:  [id number]

## 2010-11-03 LAB — DIFFERENTIAL
Basophils Relative: 1
Basophils Relative: 1
Eosinophils Absolute: 0.2
Eosinophils Absolute: 0.3
Eosinophils Absolute: 0.3
Eosinophils Relative: 3
Eosinophils Relative: 4
Eosinophils Relative: 5
Lymphocytes Relative: 34
Lymphs Abs: 2.2
Lymphs Abs: 2.3
Lymphs Abs: 2.4
Lymphs Abs: 2.4
Monocytes Absolute: 0.5
Monocytes Absolute: 0.5
Monocytes Absolute: 0.5
Monocytes Relative: 7
Monocytes Relative: 7
Monocytes Relative: 7
Monocytes Relative: 8
Monocytes Relative: 8
Neutro Abs: 3.3
Neutro Abs: 3.5
Neutro Abs: 4.1
Neutrophils Relative %: 53
Neutrophils Relative %: 53

## 2010-11-03 LAB — RENAL FUNCTION PANEL
Albumin: 3 — ABNORMAL LOW
CO2: 21
CO2: 22
CO2: 22
Calcium: 8.9
Chloride: 109
Chloride: 111
Chloride: 114 — ABNORMAL HIGH
Creatinine, Ser: 1.89 — ABNORMAL HIGH
Creatinine, Ser: 2.15 — ABNORMAL HIGH
GFR calc Af Amer: 38 — ABNORMAL LOW
GFR calc Af Amer: 41 — ABNORMAL LOW
GFR calc Af Amer: 45 — ABNORMAL LOW
GFR calc non Af Amer: 32 — ABNORMAL LOW
GFR calc non Af Amer: 34 — ABNORMAL LOW
GFR calc non Af Amer: 37 — ABNORMAL LOW
Glucose, Bld: 123 — ABNORMAL HIGH
Glucose, Bld: 97
Potassium: 5.2 — ABNORMAL HIGH

## 2010-11-03 LAB — CREATININE, SERUM
Creatinine, Ser: 3.09 — ABNORMAL HIGH
GFR calc Af Amer: 25 — ABNORMAL LOW
GFR calc non Af Amer: 21 — ABNORMAL LOW

## 2010-11-03 LAB — COMPREHENSIVE METABOLIC PANEL
ALT: 32
AST: 22
Alkaline Phosphatase: 58
BUN: 44 — ABNORMAL HIGH
CO2: 19
CO2: 22
Calcium: 7.8 — ABNORMAL LOW
Calcium: 8.6
Chloride: 110
GFR calc Af Amer: 19 — ABNORMAL LOW
GFR calc non Af Amer: 15 — ABNORMAL LOW
GFR calc non Af Amer: 16 — ABNORMAL LOW
Glucose, Bld: 104 — ABNORMAL HIGH
Sodium: 136
Total Protein: 5.9 — ABNORMAL LOW

## 2010-11-03 LAB — CBC
HCT: 34.8 — ABNORMAL LOW
HCT: 35.5 — ABNORMAL LOW
HCT: 36.2 — ABNORMAL LOW
HCT: 36.2 — ABNORMAL LOW
Hemoglobin: 11.9 — ABNORMAL LOW
Hemoglobin: 11.9 — ABNORMAL LOW
Hemoglobin: 12 — ABNORMAL LOW
Hemoglobin: 12.3 — ABNORMAL LOW
Hemoglobin: 12.3 — ABNORMAL LOW
MCHC: 33.4
MCHC: 34.2
MCHC: 34.3
MCHC: 34.4
MCV: 91.3
MCV: 91.5
MCV: 91.7
Platelets: 254
RBC: 3.65 — ABNORMAL LOW
RBC: 3.82 — ABNORMAL LOW
RBC: 3.93 — ABNORMAL LOW
RBC: 3.96 — ABNORMAL LOW
RBC: 3.96 — ABNORMAL LOW
RDW: 13.9
WBC: 6.3
WBC: 6.7
WBC: 7.1
WBC: 7.8

## 2010-11-03 LAB — POCT CARDIAC MARKERS: Operator id: 161631

## 2010-11-03 LAB — URINE MICROSCOPIC-ADD ON

## 2010-11-03 LAB — CARDIAC PANEL(CRET KIN+CKTOT+MB+TROPI)
CK, MB: 2.4
Relative Index: 1.5
Relative Index: 2
Total CK: 159
Total CK: 167
Troponin I: 0.02

## 2010-11-03 LAB — PROTIME-INR: INR: 0.9

## 2010-11-03 LAB — HEMOGLOBIN A1C
Hgb A1c MFr Bld: 5.4
Hgb A1c MFr Bld: 6.1
Mean Plasma Glucose: 115
Mean Plasma Glucose: 140

## 2010-11-03 LAB — URINALYSIS, ROUTINE W REFLEX MICROSCOPIC
Glucose, UA: NEGATIVE
Leukocytes, UA: NEGATIVE
Specific Gravity, Urine: 1.017
pH: 5

## 2010-11-03 LAB — LIPID PANEL
LDL Cholesterol: 153 — ABNORMAL HIGH
Total CHOL/HDL Ratio: 8
VLDL: 57 — ABNORMAL HIGH

## 2010-11-03 LAB — CREATININE CLEARANCE, URINE, 24 HOUR
Creatinine Clearance: 44 — ABNORMAL LOW
Creatinine, Urine: 77.7

## 2010-11-03 LAB — TYPE AND SCREEN: ABO/RH(D): A POS

## 2010-11-03 LAB — ANA: Anti Nuclear Antibody(ANA): NEGATIVE

## 2010-11-03 LAB — PSA: PSA: 1.9

## 2010-11-08 LAB — CK TOTAL AND CKMB (NOT AT ARMC)
CK, MB: 3.5
Total CK: 249 — ABNORMAL HIGH

## 2010-11-08 LAB — RENAL FUNCTION PANEL
Calcium: 9.3
GFR calc Af Amer: 37 — ABNORMAL LOW
GFR calc non Af Amer: 30 — ABNORMAL LOW
Phosphorus: 4.2
Sodium: 138

## 2010-11-08 LAB — POCT I-STAT, CHEM 8
BUN: 29 — ABNORMAL HIGH
Calcium, Ion: 1.2
Chloride: 112
Sodium: 140

## 2010-11-08 LAB — IGG, IGA, IGM
IgA: 328
IgM, Serum: 107

## 2010-11-08 LAB — GLUCOSE, CAPILLARY
Glucose-Capillary: 103 — ABNORMAL HIGH
Glucose-Capillary: 115 — ABNORMAL HIGH
Glucose-Capillary: 119 — ABNORMAL HIGH
Glucose-Capillary: 125 — ABNORMAL HIGH
Glucose-Capillary: 129 — ABNORMAL HIGH

## 2010-11-08 LAB — COMPREHENSIVE METABOLIC PANEL
AST: 16
Albumin: 3.1 — ABNORMAL LOW
CO2: 21
Calcium: 9.3
Creatinine, Ser: 2.5 — ABNORMAL HIGH
GFR calc Af Amer: 32 — ABNORMAL LOW
GFR calc non Af Amer: 27 — ABNORMAL LOW

## 2010-11-08 LAB — CARDIAC PANEL(CRET KIN+CKTOT+MB+TROPI)
CK, MB: 2.2
CK, MB: 2.3
CK, MB: 2.7
CK, MB: 3.3
Relative Index: 1.2
Relative Index: 1.3
Total CK: 164
Troponin I: 0.01
Troponin I: 0.01
Troponin I: 0.02

## 2010-11-08 LAB — PROTEIN ELECTROPH W RFLX QUANT IMMUNOGLOBULINS
Alpha-2-Globulin: 13.4 — ABNORMAL HIGH
M-Spike, %: 0.38
Total Protein ELP: 6.3

## 2010-11-08 LAB — CBC
MCHC: 33.5
MCV: 89.5
Platelets: 339
RBC: 4.76
WBC: 6.7

## 2010-11-08 LAB — URIC ACID: Uric Acid, Serum: 8.1 — ABNORMAL HIGH

## 2010-11-08 LAB — DIFFERENTIAL
Basophils Absolute: 0
Basophils Relative: 0
Eosinophils Relative: 3
Lymphocytes Relative: 27
Lymphs Abs: 2.3
Neutro Abs: 4.1
Neutrophils Relative %: 61

## 2010-11-08 LAB — POCT CARDIAC MARKERS
CKMB, poc: 3.9
Myoglobin, poc: 363

## 2010-11-08 LAB — FREE PSA
PSA, Free Pct: 55 (ref >25)
PSA, Free: 1

## 2010-11-08 LAB — OCCULT BLOOD X 1 CARD TO LAB, STOOL: Fecal Occult Bld: NEGATIVE

## 2010-11-09 LAB — GLUCOSE, CAPILLARY
Glucose-Capillary: 124 — ABNORMAL HIGH
Glucose-Capillary: 153 — ABNORMAL HIGH

## 2010-11-09 LAB — COMPREHENSIVE METABOLIC PANEL
ALT: 26
AST: 20
Albumin: 3 — ABNORMAL LOW
Alkaline Phosphatase: 71
GFR calc Af Amer: 33 — ABNORMAL LOW
Glucose, Bld: 98
Potassium: 4.6
Sodium: 134 — ABNORMAL LOW
Total Protein: 6.5

## 2010-11-09 LAB — DIFFERENTIAL
Basophils Relative: 1
Eosinophils Absolute: 0.2
Eosinophils Relative: 3
Lymphs Abs: 2.4
Monocytes Absolute: 0.6
Monocytes Relative: 9

## 2010-11-09 LAB — BASIC METABOLIC PANEL
Calcium: 9.2
Chloride: 109
Creatinine, Ser: 2.62 — ABNORMAL HIGH
GFR calc Af Amer: 30 — ABNORMAL LOW
Sodium: 139

## 2010-11-09 LAB — CBC
Hemoglobin: 13.8
Platelets: 295
RDW: 14.3

## 2010-11-11 LAB — DIFFERENTIAL
Basophils Absolute: 0 K/uL (ref 0.0–0.1)
Basophils Relative: 1 % (ref 0–1)
Eosinophils Absolute: 0.2 K/uL (ref 0.0–0.7)
Eosinophils Relative: 4 % (ref 0–5)
Lymphocytes Relative: 37 % (ref 12–46)
Lymphs Abs: 2.5 10*3/uL (ref 0.7–4.0)
Monocytes Absolute: 0.4 10*3/uL (ref 0.1–1.0)
Monocytes Relative: 7 % (ref 3–12)
Neutro Abs: 3.7 10*3/uL (ref 1.7–7.7)
Neutrophils Relative %: 53 % (ref 43–77)

## 2010-11-11 LAB — CBC
HCT: 41.7 % (ref 39.0–52.0)
Hemoglobin: 13.6 g/dL (ref 13.0–17.0)
MCHC: 32.6 g/dL (ref 30.0–36.0)
MCV: 91.6 fL (ref 78.0–100.0)
Platelets: 293 K/uL (ref 150–400)
RBC: 4.55 MIL/uL (ref 4.22–5.81)
RDW: 14.3 % (ref 11.5–15.5)
WBC: 6.9 K/uL (ref 4.0–10.5)

## 2010-11-11 LAB — POCT I-STAT, CHEM 8
BUN: 34 mg/dL — ABNORMAL HIGH (ref 6–23)
Calcium, Ion: 1.07 mmol/L — ABNORMAL LOW (ref 1.12–1.32)
Chloride: 116 meq/L — ABNORMAL HIGH (ref 96–112)
Creatinine, Ser: 2.7 mg/dL — ABNORMAL HIGH (ref 0.4–1.5)
Glucose, Bld: 104 mg/dL — ABNORMAL HIGH (ref 70–99)
HCT: 42 % (ref 39.0–52.0)
Hemoglobin: 14.3 g/dL (ref 13.0–17.0)
Potassium: 4.8 mEq/L (ref 3.5–5.1)
Sodium: 139 mEq/L (ref 135–145)
TCO2: 17 mmol/L (ref 0–100)

## 2010-11-11 LAB — BASIC METABOLIC PANEL WITH GFR
BUN: 33 mg/dL — ABNORMAL HIGH (ref 6–23)
CO2: 17 meq/L — ABNORMAL LOW (ref 19–32)
Chloride: 112 meq/L (ref 96–112)
Creatinine, Ser: 2.55 mg/dL — ABNORMAL HIGH (ref 0.4–1.5)
Glucose, Bld: 96 mg/dL (ref 70–99)
Potassium: 4.8 meq/L (ref 3.5–5.1)

## 2010-11-11 LAB — BASIC METABOLIC PANEL
Calcium: 9.1 mg/dL (ref 8.4–10.5)
GFR calc Af Amer: 31 mL/min — ABNORMAL LOW (ref >60)
GFR calc non Af Amer: 26 mL/min — ABNORMAL LOW (ref >60)
Sodium: 139 mEq/L (ref 135–145)

## 2010-11-11 LAB — GLUCOSE, CAPILLARY: Glucose-Capillary: 97 mg/dL (ref 70–99)

## 2012-10-22 ENCOUNTER — Other Ambulatory Visit: Payer: Self-pay | Admitting: *Deleted

## 2012-10-22 DIAGNOSIS — Z01810 Encounter for preprocedural cardiovascular examination: Secondary | ICD-10-CM

## 2012-10-22 DIAGNOSIS — N184 Chronic kidney disease, stage 4 (severe): Secondary | ICD-10-CM

## 2012-11-08 ENCOUNTER — Encounter: Payer: Self-pay | Admitting: Vascular Surgery

## 2012-11-09 ENCOUNTER — Encounter: Payer: Self-pay | Admitting: Vascular Surgery

## 2012-11-09 ENCOUNTER — Ambulatory Visit (INDEPENDENT_AMBULATORY_CARE_PROVIDER_SITE_OTHER): Payer: Medicare HMO | Admitting: Vascular Surgery

## 2012-11-09 ENCOUNTER — Ambulatory Visit (INDEPENDENT_AMBULATORY_CARE_PROVIDER_SITE_OTHER)
Admission: RE | Admit: 2012-11-09 | Discharge: 2012-11-09 | Disposition: A | Discharge status: Home / Self Care | Payer: Medicare HMO | Source: Ambulatory Visit | Attending: Vascular Surgery | Admitting: Vascular Surgery

## 2012-11-09 ENCOUNTER — Ambulatory Visit (HOSPITAL_COMMUNITY)
Admission: RE | Admit: 2012-11-09 | Discharge: 2012-11-09 | Disposition: A | Discharge status: Home / Self Care | Payer: Medicare HMO | Source: Ambulatory Visit | Attending: Vascular Surgery | Admitting: Vascular Surgery

## 2012-11-09 VITALS — BP 155/104 | HR 71 | Ht 74.5 in | Wt 188.5 lb

## 2012-11-09 DIAGNOSIS — N184 Chronic kidney disease, stage 4 (severe): Secondary | ICD-10-CM

## 2012-11-09 DIAGNOSIS — Z01810 Encounter for preprocedural cardiovascular examination: Secondary | ICD-10-CM

## 2012-11-09 DIAGNOSIS — Z48812 Encounter for surgical aftercare following surgery on the circulatory system: Secondary | ICD-10-CM | POA: Insufficient documentation

## 2012-11-09 NOTE — Progress Notes (Signed)
[facility]  Referred by:  Dyke Maes, MD [address] [place], [place] [address]  Reason for referral: New access  History of Present Illness  Miguel [name] is a 62 y.o. (01-14-1951) male who presents for evaluation for permanent access.  The patient is right hand dominant.  The patient has not had previous access procedures.  Previous central venous cannulation procedures include: [facility] TDC.  The patient has never had a PPM placed.  The patient has previously been on HD for a short period.  He thinks his CKD is due to HTN.  Past Medical History  Diagnosis Date  . Diabetes mellitus without complication   . Atrial fibrillation   . Chronic kidney disease   . Hypertension   . Hyperlipidemia     Past Surgical History  Procedure Laterality Date  . Back surgery      History   Social History  . Marital Status: Single    Spouse Name: N/A    Number of Children: N/A  . Years of Education: N/A   Occupational History  . Not on file.   Social History Main Topics  . Smoking status: Former Smoker    Quit date: 02/08/2007  . Smokeless tobacco: Never Used  . Alcohol Use: No  . 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 Sexual Activity: Not on file   Other Topics Concern  . Not on file   Social History Narrative  . No narrative on file    Family History  Problem Relation Age of Onset  . Deep vein thrombosis Mother   . Hypertension Mother   . Clotting disorder Mother   . Peripheral vascular disease Brother     No current outpatient prescriptions on file prior to visit.   No current facility-administered medications on file prior to visit.    No Known Allergies  REVIEW OF SYSTEMS:  (Positives checked otherwise negative)  CARDIOVASCULAR:  []  chest pain, [x]  chest pressure, []  palpitations, []  shortness of breath when laying flat, []  shortness of breath with exertion,  []  pain in feet  when walking, []  pain in feet when laying flat, []  history of blood clot in veins (DVT), []  history of phlebitis, [x]  swelling in legs, []  varicose veins  PULMONARY:  []  productive cough, []  asthma, []  wheezing  NEUROLOGIC:  []  weakness in arms or legs, []  numbness in arms or legs, []  difficulty speaking or slurred speech, []  temporary loss of vision in one eye, []  dizziness  HEMATOLOGIC:  []  bleeding problems, []  problems with blood clotting too easily  MUSCULOSKEL:  []  joint pain, []  joint swelling  GASTROINTEST:  []  vomiting blood, []  blood in stool     GENITOURINARY:  []  burning with urination, []  blood in urine  PSYCHIATRIC:  []  history of major depression  INTEGUMENTARY:  []  rashes, []  ulcers  CONSTITUTIONAL:  []  fever, []  chills   Physical Examination  Filed Vitals:   11/09/12 1523  BP: 155/104  Pulse: 71  Height: 6' 2.5" (1.892 m)  Weight: 188 lb 8 oz (85.503 kg)  SpO2: 96%   Body mass index is 23.89 kg/(m^2).  General: A&O x 3, WDWN  Head: [place]/AT  Ear/Nose/Throat: Hearing grossly intact, nares w/o erythema or drainage, oropharynx w/o Erythema/Exudate, Mallampati score: 3  Eyes: PERRLA, EOMI  Neck: Supple, no nuchal rigidity, no palpable LAD  Pulmonary: Sym exp, good air  movt, CTAB, no rales, rhonchi, & wheezing  Cardiac: RRR, Nl S1, S2, no Murmurs, rubs or gallops  Vascular: Vessel Right Left  Radial Palpable Palpable  Ulnar Palpable Palpable  Brachial Palpable Palpable  Carotid Palpable, without bruit Palpable, without bruit  Aorta Not palpable N/A  Femoral Palpable Palpable  Popliteal Not palpable Not palpable  PT Not Palpable Not Palpable  DP Not Palpable Not Palpable   Gastrointestinal: soft, NTND, -G/R, - HSM, - masses, - CVAT B  Musculoskeletal: M/S 5/5 throughout , Extremities without ischemic changes   Neurologic: CN 2-12 intact , Pain and light touch intact in extremities , Motor exam as listed above  Psychiatric: Judgment intact, Mood  & affect appropriate for pt's clinical situation  Dermatologic: See M/S exam for extremity exam, no rashes otherwise noted  Lymph : No Cervical, Axillary, or Inguinal lymphadenopathy   Non-Invasive Vascular Imaging  Vein Mapping  (Date: 11/09/2012):   R arm: acceptable vein conduits include upper arm brachial and cephalic vein  L arm: acceptable vein conduits include entire cephalic, possibly entire basilic  Medical Decision Making  Miguel [name] is a 62 y.o. male who presents with chronic kidney disease stage IV-V  Based on vein mapping and examination, this patient's permanent access options include: L RC vs BC AVF, L BVT, R BC AVF, R BVT.  I would start with L arm fistula.  I had an extensive discussion with this patient in regards to the nature of access surgery, including risk, benefits, and alternatives.    The patient is aware that the risks of access surgery include but are not limited to: bleeding, infection, steal syndrome, nerve damage, ischemic monomelic neuropathy, failure of access to mature, and possible need for additional access procedures in the future.  The patient has not agreed to proceed with the above procedure.  He is going to discuss his options with his nephrologist prior to proceeding.Leonides Sake, MD [facility] Office: [phone] Pager: [phone]  11/09/2012, 5:08 PM

## 2012-11-12 ENCOUNTER — Other Ambulatory Visit: Payer: Self-pay | Admitting: Vascular Surgery

## 2012-11-12 DIAGNOSIS — Z01810 Encounter for preprocedural cardiovascular examination: Secondary | ICD-10-CM

## 2012-11-12 DIAGNOSIS — N184 Chronic kidney disease, stage 4 (severe): Secondary | ICD-10-CM

## 2012-12-18 ENCOUNTER — Other Ambulatory Visit: Payer: Self-pay

## 2012-12-19 ENCOUNTER — Encounter (HOSPITAL_COMMUNITY): Payer: Self-pay | Admitting: Pharmacist

## 2012-12-20 ENCOUNTER — Other Ambulatory Visit (HOSPITAL_COMMUNITY): Payer: Self-pay | Admitting: Cardiology

## 2012-12-20 DIAGNOSIS — R079 Chest pain, unspecified: Secondary | ICD-10-CM

## 2012-12-21 ENCOUNTER — Encounter (HOSPITAL_COMMUNITY)
Admission: RE | Admit: 2012-12-21 | Discharge: 2012-12-21 | Disposition: A | Discharge status: Home / Self Care | Payer: Medicare HMO | Source: Ambulatory Visit | Attending: Anesthesiology | Admitting: Anesthesiology

## 2012-12-21 ENCOUNTER — Encounter (HOSPITAL_COMMUNITY): Payer: Self-pay

## 2012-12-21 ENCOUNTER — Encounter (HOSPITAL_COMMUNITY)
Admission: RE | Admit: 2012-12-21 | Discharge: 2012-12-21 | Disposition: A | Discharge status: Home / Self Care | Payer: Medicare HMO | Source: Ambulatory Visit | Attending: Vascular Surgery | Admitting: Vascular Surgery

## 2012-12-21 HISTORY — DX: Gastro-esophageal reflux disease without esophagitis: K21.9

## 2012-12-21 HISTORY — DX: Unspecified osteoarthritis, unspecified site: M19.90

## 2012-12-21 NOTE — Progress Notes (Signed)
Dr. Sharyn Lull states they will send records over

## 2012-12-21 NOTE — Progress Notes (Signed)
Anesthesia Note:  Patient is a 62 year old male scheduled for creation of a left radiocephalic versus brachiocephalic AVF on 12/24/12 by Dr. Imogene Burn.  Procedure is posted for MAC.  History includes afib, HTN, DM2, CKD, GERD, arthritis, L4-5 diskectomy '03. PCP is listed as Dr. Alysia Penna. Nephrologist is Dr. Briant Cedar.    He was seen by cardiologist Dr. Sharyn Lull just yesterday. Apparently, patient is anticipating a big back surgery next month and reported a vague history of chest pain a few weeks ago. Dr. Sharyn Lull wanted to perform a stress test prior to his back surgery. Test is scheduled for 12/28/12.  I called and spoke with Dr. Sharyn Lull about plans for AVF on Monday.  I asked if this procedure should be delayed until after patient's stress test.  Dr. Sharyn Lull felt that as long as patient was not having new or progressive symptoms then it should be okay to proceed with AVF on Monday.  His office will send patient's recent EKG which he reported showed only "nonspecific" T wave changes.  (EKG on 12/20/12 showed NSR, baseline wanderer in leads I, II, non-specific ST/T wave abnormality.)  Currently, his last nuclear stress test on 12/09/07 showed: 1. Negative for pharmacologic-stress induced ischemia.  2. Left ventricular ejection fraction 54%.  CXR on 12/21/12 showed no evidence of active cardiopulmonary disease.  He will get labs on the day of surgery.    I sent Dr. Imogene Burn a staff message regarding my conversation with Dr. Sharyn Lull today. If no acute changes and labs are reasonable then would anticipate he could proceed.  He will be further evaluated by Dr. Imogene Burn and his anesthesiologist on the day of surgery.  Velna Ochs [facility] Short Stay Center/Anesthesiology Phone [phone] 12/21/2012 4:54 PM

## 2012-12-21 NOTE — Progress Notes (Addendum)
req'd ekg recent from 12/20/12 per patient, office notes, any cardiac tests myocar test to be done 12/28/12 per patient

## 2012-12-21 NOTE — Pre-Procedure Instructions (Addendum)
Miguel [name]  12/21/2012   Your procedure is scheduled on:  12/24/12  Report to [facility] short stay admitting at 530 AM.  Call this number if you have problems the morning of surgery: [phone]   Remember:   Do not eat food or drink liquids after midnight.   Take these medicines the morning of surgery with A SIP OF WATER: metoprolol,gabapentin,  Amlodipine, clonidine, omeprazole, sodium bicarb                      Do not wear jewelry, make-up or nail polish.  Do not wear lotions, powders, or perfumes. You may wear deodorant.  Do not shave 48 hours prior to surgery. Men may shave face and neck.  Do not bring valuables to the hospital.  [facility] is not responsible                  for any belongings or valuables.               Contacts, dentures or bridgework may not be worn into surgery.  Leave suitcase in the car. After surgery it may be brought to your room.  For patients admitted to the hospital, discharge time is determined by your                treatment team.               Patients discharged the day of surgery will not be allowed to drive  home.  Name and phone number of your driver:   Special Instructions: Shower using CHG 2 nights before surgery and the night before surgery.  If you shower the day of surgery use CHG.  Use special wash - you have one bottle of CHG for all showers.  You should use approximately 1/3 of the bottle for each shower.   Please read over the following fact sheets that you were given: Pain Booklet, Coughing and Deep Breathing and Surgical Site Infection Prevention

## 2012-12-21 NOTE — Progress Notes (Signed)
12/21/12 1420  OBSTRUCTIVE SLEEP APNEA  Have you ever been diagnosed with sleep apnea through a sleep study? No  Do you snore loudly (loud enough to be heard through closed doors)?  0  Do you often feel tired, fatigued, or sleepy during the daytime? 0  Has anyone observed you stop breathing during your sleep? 0  Do you have, or are you being treated for high blood pressure? 1  BMI more than 35 kg/m2? 1  Age over 64 years old? 1  Neck circumference greater than 40 cm/18 inches? 1 (19)  Gender: 1  Obstructive Sleep Apnea Score 5  Score 4 or greater  Results sent to PCP

## 2012-12-23 MED ORDER — DEXTROSE 5 % IV SOLN
1.5000 g | INTRAVENOUS | Status: AC
  Administered 2012-12-24: 1.5 g via INTRAVENOUS
  Filled 2012-12-23: qty 1.5

## 2012-12-24 ENCOUNTER — Encounter (HOSPITAL_COMMUNITY): Payer: Medicare HMO | Admitting: Vascular Surgery

## 2012-12-24 ENCOUNTER — Telehealth: Payer: Self-pay | Admitting: Vascular Surgery

## 2012-12-24 ENCOUNTER — Encounter (HOSPITAL_COMMUNITY)
Admission: RE | Disposition: A | Discharge status: Home / Self Care | Payer: Self-pay | Source: Ambulatory Visit | Attending: Vascular Surgery

## 2012-12-24 ENCOUNTER — Ambulatory Visit (HOSPITAL_COMMUNITY)
Admission: RE | Admit: 2012-12-24 | Discharge: 2012-12-24 | Disposition: A | Discharge status: Home / Self Care | Payer: Medicare HMO | Source: Ambulatory Visit | Attending: Vascular Surgery | Admitting: Vascular Surgery

## 2012-12-24 ENCOUNTER — Encounter (HOSPITAL_COMMUNITY): Payer: Self-pay | Admitting: Surgery

## 2012-12-24 ENCOUNTER — Ambulatory Visit (HOSPITAL_COMMUNITY): Payer: Medicare HMO | Admitting: Critical Care Medicine

## 2012-12-24 DIAGNOSIS — I4891 Unspecified atrial fibrillation: Secondary | ICD-10-CM | POA: Insufficient documentation

## 2012-12-24 DIAGNOSIS — I129 Hypertensive chronic kidney disease with stage 1 through stage 4 chronic kidney disease, or unspecified chronic kidney disease: Secondary | ICD-10-CM | POA: Insufficient documentation

## 2012-12-24 DIAGNOSIS — E785 Hyperlipidemia, unspecified: Secondary | ICD-10-CM | POA: Insufficient documentation

## 2012-12-24 DIAGNOSIS — E119 Type 2 diabetes mellitus without complications: Secondary | ICD-10-CM | POA: Insufficient documentation

## 2012-12-24 DIAGNOSIS — N184 Chronic kidney disease, stage 4 (severe): Secondary | ICD-10-CM

## 2012-12-24 DIAGNOSIS — K219 Gastro-esophageal reflux disease without esophagitis: Secondary | ICD-10-CM | POA: Insufficient documentation

## 2012-12-24 DIAGNOSIS — Z01818 Encounter for other preprocedural examination: Secondary | ICD-10-CM | POA: Insufficient documentation

## 2012-12-24 HISTORY — PX: AV FISTULA PLACEMENT: SHX1204

## 2012-12-24 LAB — POCT I-STAT 4, (NA,K, GLUC, HGB,HCT): Potassium: 4.7 mEq/L (ref 3.5–5.1)

## 2012-12-24 SURGERY — ARTERIOVENOUS (AV) FISTULA CREATION
Anesthesia: General | Site: Arm Lower | Laterality: Left | Wound class: Clean

## 2012-12-24 MED ORDER — ONDANSETRON HCL 4 MG/2ML IJ SOLN: 4.0000 mg | Freq: Once | INTRAMUSCULAR | Status: DC | PRN

## 2012-12-24 MED ORDER — LIDOCAINE-EPINEPHRINE (PF) 1 %-1:200000 IJ SOLN
INTRAMUSCULAR | Status: AC
  Filled 2012-12-24: qty 10

## 2012-12-24 MED ORDER — BUPIVACAINE HCL (PF) 0.25 % IJ SOLN
INTRAMUSCULAR | Status: AC
  Filled 2012-12-24: qty 30

## 2012-12-24 MED ORDER — ONDANSETRON HCL 4 MG/2ML IJ SOLN
INTRAMUSCULAR | Status: DC | PRN
  Administered 2012-12-24: 4 mg via INTRAVENOUS

## 2012-12-24 MED ORDER — PROPOFOL 10 MG/ML IV BOLUS
INTRAVENOUS | Status: DC | PRN
  Administered 2012-12-24: 200 mg via INTRAVENOUS

## 2012-12-24 MED ORDER — GLYCOPYRROLATE 0.2 MG/ML IJ SOLN
INTRAMUSCULAR | Status: DC | PRN
  Administered 2012-12-24 (×2): 0.1 mg via INTRAVENOUS

## 2012-12-24 MED ORDER — SODIUM CHLORIDE 0.9 % IV SOLN: INTRAVENOUS | Status: DC

## 2012-12-24 MED ORDER — FENTANYL CITRATE 0.05 MG/ML IJ SOLN
INTRAMUSCULAR | Status: DC | PRN
  Administered 2012-12-24 (×2): 25 ug via INTRAVENOUS

## 2012-12-24 MED ORDER — THROMBIN 20000 UNITS EX SOLR
CUTANEOUS | Status: AC
  Filled 2012-12-24: qty 20000

## 2012-12-24 MED ORDER — LIDOCAINE HCL (CARDIAC) 20 MG/ML IV SOLN
INTRAVENOUS | Status: DC | PRN
  Administered 2012-12-24: 100 mg via INTRAVENOUS

## 2012-12-24 MED ORDER — SODIUM CHLORIDE 0.9 % IR SOLN
Status: DC | PRN
  Administered 2012-12-24: 08:00:00

## 2012-12-24 MED ORDER — 0.9 % SODIUM CHLORIDE (POUR BTL) OPTIME
TOPICAL | Status: DC | PRN
  Administered 2012-12-24: 1000 mL

## 2012-12-24 MED ORDER — EPHEDRINE SULFATE 50 MG/ML IJ SOLN
INTRAMUSCULAR | Status: DC | PRN
  Administered 2012-12-24 (×2): 10 mg via INTRAVENOUS
  Administered 2012-12-24: 5 mg via INTRAVENOUS

## 2012-12-24 MED ORDER — PHENYLEPHRINE HCL 10 MG/ML IJ SOLN
INTRAMUSCULAR | Status: DC | PRN
  Administered 2012-12-24: 80 ug via INTRAVENOUS

## 2012-12-24 MED ORDER — HYDROMORPHONE HCL PF 1 MG/ML IJ SOLN: 0.2500 mg | INTRAMUSCULAR | Status: DC | PRN

## 2012-12-24 MED ORDER — OXYCODONE HCL 5 MG PO TABS: 5.0000 mg | ORAL_TABLET | ORAL | Status: DC | PRN

## 2012-12-24 MED ORDER — SODIUM CHLORIDE 0.9 % IV SOLN
INTRAVENOUS | Status: DC | PRN
  Administered 2012-12-24 (×2): via INTRAVENOUS

## 2012-12-24 SURGICAL SUPPLY — 41 items
ADH SKN CLS APL DERMABOND .7 (GAUZE/BANDAGES/DRESSINGS) ×2
ARMBAND PINK RESTRICT EXTREMIT (MISCELLANEOUS) ×2 IMPLANT
CANISTER SUCTION 2500CC (MISCELLANEOUS) ×2 IMPLANT
CLIP TI MEDIUM 6 (CLIP) ×2 IMPLANT
CLIP TI WIDE RED SMALL 6 (CLIP) ×2 IMPLANT
COVER PROBE W GEL 5X96 (DRAPES) ×1 IMPLANT
COVER SURGICAL LIGHT HANDLE (MISCELLANEOUS) ×2 IMPLANT
DECANTER SPIKE VIAL GLASS SM (MISCELLANEOUS) ×2 IMPLANT
DERMABOND ADVANCED (GAUZE/BANDAGES/DRESSINGS) ×2
DERMABOND ADVANCED .7 DNX12 (GAUZE/BANDAGES/DRESSINGS) ×1 IMPLANT
ELECT REM PT RETURN 9FT ADLT (ELECTROSURGICAL) ×2
ELECTRODE REM PT RTRN 9FT ADLT (ELECTROSURGICAL) ×1 IMPLANT
GLOVE BIO SURGEON STRL SZ 6.5 (GLOVE) ×1 IMPLANT
GLOVE BIO SURGEON STRL SZ7 (GLOVE) ×3 IMPLANT
GLOVE BIOGEL PI IND STRL 6.5 (GLOVE) IMPLANT
GLOVE BIOGEL PI IND STRL 7.0 (GLOVE) IMPLANT
GLOVE BIOGEL PI IND STRL 7.5 (GLOVE) ×1 IMPLANT
GLOVE BIOGEL PI INDICATOR 6.5 (GLOVE) ×2
GLOVE BIOGEL PI INDICATOR 7.0 (GLOVE) ×1
GLOVE BIOGEL PI INDICATOR 7.5 (GLOVE) ×1
GLOVE SURG SS PI 7.0 STRL IVOR (GLOVE) ×1 IMPLANT
GOWN STRL NON-REIN LRG LVL3 (GOWN DISPOSABLE) ×6 IMPLANT
GOWN STRL REIN XL XLG (GOWN DISPOSABLE) ×1 IMPLANT
KIT BASIN OR (CUSTOM PROCEDURE TRAY) ×2 IMPLANT
KIT ROOM TURNOVER OR (KITS) ×2 IMPLANT
NDL HYPO 25GX1X1/2 BEV (NEEDLE) ×1 IMPLANT
NEEDLE HYPO 25GX1X1/2 BEV (NEEDLE) ×2 IMPLANT
NS IRRIG 1000ML POUR BTL (IV SOLUTION) ×2 IMPLANT
PACK CV ACCESS (CUSTOM PROCEDURE TRAY) ×2 IMPLANT
PAD ARMBOARD 7.5X6 YLW CONV (MISCELLANEOUS) ×4 IMPLANT
PAD SHARPS MAGNETIC DISPOSAL (MISCELLANEOUS) ×1 IMPLANT
SPONGE SURGIFOAM ABS GEL 100 (HEMOSTASIS) IMPLANT
SUT MNCRL AB 4-0 PS2 18 (SUTURE) ×3 IMPLANT
SUT PROLENE 6 0 BV (SUTURE) ×1 IMPLANT
SUT PROLENE 7 0 BV 1 (SUTURE) ×3 IMPLANT
SUT VIC AB 3-0 SH 27 (SUTURE) ×4
SUT VIC AB 3-0 SH 27X BRD (SUTURE) ×1 IMPLANT
TOWEL OR 17X24 6PK STRL BLUE (TOWEL DISPOSABLE) ×2 IMPLANT
TOWEL OR 17X26 10 PK STRL BLUE (TOWEL DISPOSABLE) ×2 IMPLANT
UNDERPAD 30X30 INCONTINENT (UNDERPADS AND DIAPERS) ×2 IMPLANT
WATER STERILE IRR 1000ML POUR (IV SOLUTION) ×2 IMPLANT

## 2012-12-24 NOTE — Preoperative (Signed)
Beta Blockers   Reason not to administer Beta Blockers:Not Applicable, pt took metoprolol 12/24/12

## 2012-12-24 NOTE — H&P (Signed)
[facility] [place]  Brief History and Physical  History of Present Illness  Miguel [name] is a 62 y.o. male who presents with chief complaint: CKD IV-V.  The patient presents today for placement of L arm fistula.    Past Medical History  Diagnosis Date  . Atrial fibrillation   . Chronic kidney disease   . Hypertension   . Hyperlipidemia   . Diabetes mellitus without complication     no rx  . GERD (gastroesophageal reflux disease)   . Arthritis     Past Surgical History  Procedure Laterality Date  . Back surgery      History   Social History  . Marital Status: Single    Spouse Name: N/A    Number of Children: N/A  . Years of Education: N/A   Occupational History  . Not on file.   Social History Main Topics  . Smoking status: Former Smoker -- 0.25 packs/day for 30 years    Types: Cigarettes    Quit date: 02/08/2007  . Smokeless tobacco: Never Used     Comment: 09 last alcohol  . Alcohol Use: No  . 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 Sexual Activity: Not on file   Other Topics Concern  . Not on file   Social History Narrative  . No narrative on file    Family History  Problem Relation Age of Onset  . Deep vein thrombosis Mother   . Hypertension Mother   . Clotting disorder Mother   . Peripheral vascular disease Brother     No current facility-administered medications on file prior to encounter.   Current Outpatient Prescriptions on File Prior to Encounter  Medication Sig Dispense Refill  . allopurinol (ZYLOPRIM) 100 MG tablet Take 100 mg by mouth daily.      . calcitRIOL (ROCALTROL) 0.25 MCG capsule Take 0.25 mcg by mouth daily.      . furosemide (LASIX) 40 MG tablet Take 40 mg by mouth 2 (two) times daily. Takes at [employer] and 4PM daily.      Marland Kitchen gabapentin (NEURONTIN) 100 MG capsule Take 100 mg by mouth 2 (two) times daily.      . metoprolol (LOPRESSOR) 50 MG tablet Take 50  mg by mouth at bedtime.       . metoprolol succinate (TOPROL-XL) 100 MG 24 hr tablet Take 100 mg by mouth daily. Take with or immediately following a meal.      . omeprazole (PRILOSEC) 20 MG capsule Take 20 mg by mouth 2 (two) times daily.      . sodium bicarbonate 650 MG tablet Take 650 mg by mouth 3 (three) times daily.      . sodium polystyrene (KAYEXALATE) 15 GM/60ML suspension Take 15 g by mouth 1 day or 1 dose.         No Known Allergies  Review of Systems: As listed above, otherwise negative.  Physical Examination  Filed Vitals:   12/24/12 0608  BP: 130/86  Pulse: 64  Temp: 97.4 F (36.3 C)  TempSrc: Oral  Resp: 20  SpO2: 100%    General: A&O x 3, WDWN  Pulmonary: Sym exp, good air movt, CTAB, no rales, rhonchi, & wheezing  Cardiac: RRR, Nl S1, S2, no Murmurs, rubs or gallops  Gastrointestinal: soft, NTND, -G/R, - HSM, - masses, - CVAT B  Musculoskeletal: M/S 5/5 throughout , Extremities  without ischemic changes   Laboratory See iStat  Medical Decision Making  Miguel [name] is a 62 y.o. male who presents with: CKD IV-V.   The patient is scheduled for: L arm fistula placement  Risk, benefits, and alternatives to access surgery were discussed.  The patient is aware the risks include but are not limited to: bleeding, infection, steal syndrome, nerve damage, ischemic monomelic neuropathy, failure to mature, and need for additional procedures.  The patient is aware of the risks and agrees to proceed.  Leonides Sake, MD [facility] [place] Office: [phone] Pager: [phone]  12/24/2012, 7:09 AM

## 2012-12-24 NOTE — Op Note (Signed)
OPERATIVE NOTE   PROCEDURE: left radiocephaic arteriovenous fistula placement  PRE-OPERATIVE DIAGNOSIS: chronic kidney disease stage IV   POST-OPERATIVE DIAGNOSIS: same as above   SURGEON: Nilda Simmer, MD  ASSISTANT(S): Della Goo, PAC   ANESTHESIA: general  ESTIMATED BLOOD LOSS: 50 cc  FINDING(S): 1.  Weakly palpable thrill at end of case 2.  Dopplerable radial signal at end of case  SPECIMEN(S):  none  INDICATIONS:   Miguel [name] is a 62 y.o. male who  presents with chronic kidney disease stage IV .  The patient is scheduled for left radiocephalic vs brachiocephalic arteriovenous fistula  arteriovenous fistula placement.  The patient is aware the risks include but are not limited to: bleeding, infection, steal syndrome, nerve damage, ischemic monomelic neuropathy, failure to mature, and need for additional procedures.  The patient is aware of the risks of the procedure and elects to proceed forward.  DESCRIPTION: After full informed written consent was obtained from the patient, the patient was brought back to the operating room and placed supine upon the operating table.  Prior to induction, the patient received IV antibiotics.   After obtaining adequate anesthesia, the patient was then prepped and draped in the standard fashion for a right arm access procedure.  I turned my attention first to identifying the patient's distal cephalic vein and radial artery.  Using SonoSite guidance, the location of these vessels were marked out on the skin.   The portion of the cephalic vein >3 mm was on the dorsal surface of the forearm, so I felt two incision were going to be necessary.  I made a longitudinal incision over the radial artery and dissected through the subcutaneous tissue and fascia to gain exposure of the radial artery.  This was noted to be 2.5 mm in diameter externally.  This was dissected out proximally and distally.  I then made an incision over the cephalic vein  and dissected it out.  This was noted to be 3.0-3.5 mm in diameter externally.  The distal segment of the vein was ligated with a  2-0 silk, and the vein was transected.  The proximal segment was iinterrogated with serial dilators.  The vein accepted up to a 3.5 mm dilator without any difficulty.  I then instilled the heparinized saline into the vein and clamped it.  At this point, I reset my exposure of the radial artery and clamped the artery proximally and distally.  I made an arteriotomy with a #11 blade, and then I extended the arteriotomy with a Potts scissor.  I injected heparinized saline proximal and distal to this arteriotomy.  The vein was then sewn to the artery in an end-to-side configuration with a running stitch of 7-0 Prolene.  Prior to completing this anastomosis, I allowed the vein and artery to backbleed.  There was no evidence of clot from any vessels.  I completed the anastomosis in the usual fashion and then released all and clamps.  There was a weakly palpable  thrill in the venous outflow, and there was a dopplerable radial pulse.  i expect the thrill to improve once the spasm from the Serrafin clamps on the artery resolves.  At this point, I irrigated out the surgical wound.  There was no further active bleeding.  The subcutaneous tissue was reapproximated with a running stitch of 3-0 Vicryl.  The skin was then reapproximated with a running subcuticular stitch of 4-0 Vicryl.  The skin was then cleaned, dried, and reinforced with Dermabond.  The patient  tolerated this procedure well.   COMPLICATIONS: none  CONDITION: stable   Nilda Simmer, MD 12/24/2012 8:39 AM

## 2012-12-24 NOTE — Anesthesia Procedure Notes (Signed)
Procedure Name: LMA Insertion Date/Time: 12/24/2012 7:25 AM Performed by: Elon Alas Pre-anesthesia Checklist: Patient identified, Timeout performed, Suction available, Emergency Drugs available and Patient being monitored Patient Re-evaluated:Patient Re-evaluated prior to inductionOxygen Delivery Method: Circle system utilized Preoxygenation: Pre-oxygenation with 100% oxygen Intubation Type: IV induction Ventilation: Mask ventilation without difficulty LMA: LMA with gastric port inserted LMA Size: 5.0 Number of attempts: 1 Placement Confirmation: positive ETCO2 and breath sounds checked- equal and bilateral Tube secured with: Tape Dental Injury: Teeth and Oropharynx as per pre-operative assessment

## 2012-12-24 NOTE — Anesthesia Preprocedure Evaluation (Addendum)
Anesthesia Evaluation  Patient identified by MRN, date of birth, ID band Patient awake    Reviewed: Allergy & Precautions, H&P , NPO status , Patient's Chart, lab work & pertinent test results, reviewed documented beta blocker date and time   Airway Mallampati: III TM Distance: >3 FB Neck ROM: Full    Dental  (+) Dental Advisory Given, Poor Dentition and Loose   Pulmonary former smoker,          Cardiovascular hypertension, Pt. on medications and Pt. on home beta blockers + dysrhythmias Atrial Fibrillation     Neuro/Psych    GI/Hepatic   Endo/Other  diabetes, Type 2  Renal/GU CRF, Dialysis and ESRFRenal disease     Musculoskeletal  (+) Arthritis -,   Abdominal   Peds  Hematology   Anesthesia Other Findings   Reproductive/Obstetrics                         Anesthesia Physical Anesthesia Plan  ASA: III  Anesthesia Plan: General   Post-op Pain Management:    Induction: Intravenous  Airway Management Planned: Oral ETT  Additional Equipment:   Intra-op Plan:   Post-operative Plan: Extubation in OR  Informed Consent: I have reviewed the patients History and Physical, chart, labs and discussed the procedure including the risks, benefits and alternatives for the proposed anesthesia with the patient or authorized representative who has indicated his/her understanding and acceptance.     Plan Discussed with: Anesthesiologist, CRNA and Surgeon  Anesthesia Plan Comments:         Anesthesia Quick Evaluation

## 2012-12-24 NOTE — Transfer of Care (Signed)
Immediate Anesthesia Transfer of Care Note  Patient: Miguel [name]  Procedure(s) Performed: Procedure(s): ARTERIOVENOUS (AV) FISTULA CREATION- left radiocephalic  (Left)  Patient Location: PACU  Anesthesia Type:General  Level of Consciousness: awake, alert  and oriented  Airway & Oxygen Therapy: Patient Spontanous Breathing and Patient connected to nasal cannula oxygen  Post-op Assessment: Report given to PACU RN, Post -op Vital signs reviewed and stable and Patient moving all extremities X 4  Post vital signs: Reviewed and stable  Complications: No apparent anesthesia complications

## 2012-12-24 NOTE — Anesthesia Postprocedure Evaluation (Signed)
  Anesthesia Post-op Note  Patient: Miguel [name]  Procedure(s) Performed: Procedure(s): ARTERIOVENOUS (AV) FISTULA CREATION- left radiocephalic  (Left)  Patient Location: PACU  Anesthesia Type:General  Level of Consciousness: awake, oriented, sedated and patient cooperative  Airway and Oxygen Therapy: Patient Spontanous Breathing  Post-op Pain: mild  Post-op Assessment: Post-op Vital signs reviewed, Patient's Cardiovascular Status Stable, Respiratory Function Stable, Patent Airway, No signs of Nausea or vomiting and Pain level controlled  Post-op Vital Signs: stable  Complications: No apparent anesthesia complications

## 2012-12-24 NOTE — Telephone Encounter (Addendum)
Message copied by Fredrich Birks on Mon Dec 24, 2012  9:29 AM ------      Message from: Marlowe Shores      Created: Mon Dec 24, 2012  8:51 AM       4-6 week AVF F/U Imogene Burn ------  12/24/12: lm for pt, dpm

## 2012-12-26 ENCOUNTER — Encounter (HOSPITAL_COMMUNITY): Payer: Self-pay | Admitting: Vascular Surgery

## 2012-12-28 ENCOUNTER — Encounter (HOSPITAL_COMMUNITY)
Admission: RE | Admit: 2012-12-28 | Discharge: 2012-12-28 | Disposition: A | Discharge status: Home / Self Care | Payer: Medicare HMO | Source: Ambulatory Visit | Attending: Cardiology | Admitting: Cardiology

## 2012-12-28 ENCOUNTER — Other Ambulatory Visit: Payer: Self-pay

## 2012-12-28 VITALS — BP 129/80 | HR 71

## 2012-12-28 DIAGNOSIS — R079 Chest pain, unspecified: Secondary | ICD-10-CM

## 2012-12-28 MED ORDER — REGADENOSON 0.4 MG/5ML IV SOLN
INTRAVENOUS | Status: AC
  Administered 2012-12-28: 0.4 mg
  Filled 2012-12-28: qty 5

## 2012-12-28 MED ORDER — TECHNETIUM TC 99M SESTAMIBI - CARDIOLITE
30.0000 | Freq: Once | INTRAVENOUS | Status: AC | PRN
  Administered 2012-12-28: 30 via INTRAVENOUS

## 2012-12-28 MED ORDER — TECHNETIUM TC 99M SESTAMIBI GENERIC - CARDIOLITE: 10.0000 | Freq: Once | INTRAVENOUS | Status: DC | PRN

## 2012-12-28 MED ORDER — TECHNETIUM TC 99M SESTAMIBI GENERIC - CARDIOLITE
10.0000 | Freq: Once | INTRAVENOUS | Status: AC | PRN
  Administered 2012-12-28: 10 via INTRAVENOUS

## 2013-01-15 NOTE — Progress Notes (Signed)
Patient ID: Miguel [name], male   DOB: 1950-03-19, 62 y.o.   MRN: [id number] Pt. walked into office c/o pain in the left arm AVF site.  Stated there were bubbles coming from the incision.  Taken to exam room.  Upon assessment of left arm, noted two incisions at the radiocephalic AVF site were intact/ well-approximated, without redness or drainage.  Minimal swelling noted at the incisions. Noted dry appearance to the skin along incisions.  Stated he wasn't given instructions on care of the incisions.  Also, stated he noticed that the clear covering that was there following surgery, had been peeling off.  Cleaned area with Deatra James wound cleanser.  Applied clean, dry, gauze bandage to incisions at left forearm.  Instructed to monitor for any signs of infection; ie: increased redness, warmth, tenderness, drainage, fever/ chills.  Encouraged to cleanse area daily with soap and water, being careful to completely rinse the site.  Advised he may cover with a light bandage during day, if he feels need to, and to allow incisional area to be open to air at night.  Has appt. for f/u with Dr. Imogene Burn 01/25/13.   Pt. Verb. Understanding of instructions.

## 2013-01-23 ENCOUNTER — Other Ambulatory Visit: Payer: Self-pay | Admitting: *Deleted

## 2013-01-23 DIAGNOSIS — N186 End stage renal disease: Secondary | ICD-10-CM

## 2013-01-23 DIAGNOSIS — Z4931 Encounter for adequacy testing for hemodialysis: Secondary | ICD-10-CM

## 2013-01-24 ENCOUNTER — Encounter: Payer: Self-pay | Admitting: Vascular Surgery

## 2013-01-25 ENCOUNTER — Ambulatory Visit (HOSPITAL_COMMUNITY)
Admit: 2013-01-25 | Discharge: 2013-01-25 | Disposition: A | Discharge status: Home / Self Care | Payer: Medicare HMO | Attending: Vascular Surgery | Admitting: Vascular Surgery

## 2013-01-25 ENCOUNTER — Encounter: Payer: Self-pay | Admitting: Vascular Surgery

## 2013-01-25 ENCOUNTER — Ambulatory Visit (INDEPENDENT_AMBULATORY_CARE_PROVIDER_SITE_OTHER): Payer: Commercial Managed Care - HMO | Admitting: Vascular Surgery

## 2013-01-25 VITALS — BP 125/78 | HR 70 | Ht 75.0 in | Wt 292.0 lb

## 2013-01-25 DIAGNOSIS — N186 End stage renal disease: Secondary | ICD-10-CM | POA: Insufficient documentation

## 2013-01-25 DIAGNOSIS — Z4931 Encounter for adequacy testing for hemodialysis: Secondary | ICD-10-CM

## 2013-01-25 DIAGNOSIS — N184 Chronic kidney disease, stage 4 (severe): Secondary | ICD-10-CM

## 2013-01-25 NOTE — Progress Notes (Signed)
[facility] [place]  Postoperative Access Visit  History of Present Illness  Miguel [name] is a 62 y.o. year old male who presents for postoperative follow-up for: L RC AVF (Date: 12/24/12).  The patient's wounds are healed.  The patient notes no steal symptoms.  The patient is able to complete their activities of daily living.  The patient's current symptoms are: none.  Past Medical History  Diagnosis Date  . Atrial fibrillation   . Chronic kidney disease   . Hypertension   . Hyperlipidemia   . Diabetes mellitus without complication     no rx  . GERD (gastroesophageal reflux disease)   . Arthritis     Past Surgical History  Procedure Laterality Date  . Back surgery    . Av fistula placement Left 12/24/2012    Procedure: ARTERIOVENOUS (AV) FISTULA CREATION- left radiocephalic ;  Surgeon: Fransisco Hertz, MD;  Location: [facility] OR;  Service: Vascular;  Laterality: Left;    History   Social History  . Marital Status: Single    Spouse Name: N/A    Number of Children: N/A  . Years of Education: N/A   Occupational History  . Not on file.   Social History Main Topics  . Smoking status: Former Smoker -- 0.25 packs/day for 30 years    Types: Cigarettes    Quit date: 02/08/2007  . Smokeless tobacco: Never Used     Comment: 09 last alcohol  . Alcohol Use: No  . 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 Sexual Activity: Not on file   Other Topics Concern  . Not on file   Social History Narrative  . No narrative on file    Family History  Problem Relation Age of Onset  . Deep vein thrombosis Mother   . Hypertension Mother   . Clotting disorder Mother   . Peripheral vascular disease Brother     Current Outpatient Prescriptions on File Prior to Visit  Medication Sig Dispense Refill  . allopurinol (ZYLOPRIM) 100 MG tablet Take 100 mg by mouth daily.      Marland Kitchen amLODipine (NORVASC) 10 MG tablet Take 10 mg  by mouth daily.      . Bisacodyl (LAXATIVE PO) Take 1 tablet by mouth daily.      . calcitRIOL (ROCALTROL) 0.25 MCG capsule Take 0.25 mcg by mouth daily.      . cloNIDine (CATAPRES) 0.1 MG tablet Take 0.1 mg by mouth 2 (two) times daily.       . furosemide (LASIX) 40 MG tablet Take 40 mg by mouth 2 (two) times daily. Takes at [employer] and 4PM daily.      Marland Kitchen gabapentin (NEURONTIN) 100 MG capsule Take 100 mg by mouth 2 (two) times daily.      . metoprolol (LOPRESSOR) 50 MG tablet Take 50 mg by mouth at bedtime.       . metoprolol succinate (TOPROL-XL) 100 MG 24 hr tablet Take 100 mg by mouth daily. Take with or immediately following a meal.      . nitroGLYCERIN (NITROSTAT) 0.3 MG SL tablet Place 0.3 mg under the tongue every 5 (five) minutes as needed for chest pain.      Marland Kitchen omeprazole (PRILOSEC) 20 MG capsule Take 20 mg by mouth 2 (two) times daily.      Marland Kitchen oxyCODONE (ROXICODONE) 5 MG immediate release tablet Take 1 tablet (5  mg total) by mouth every 4 (four) hours as needed for severe pain.  30 tablet  0  . rosuvastatin (CRESTOR) 20 MG tablet Take 20 mg by mouth at bedtime.      . sodium bicarbonate 650 MG tablet Take 650 mg by mouth 3 (three) times daily.      . sodium polystyrene (KAYEXALATE) 15 GM/60ML suspension Take 15 g by mouth 1 day or 1 dose.       . Testosterone (ANDROGEL) 20.25 MG/1.25GM (1.62%) GEL Place 1 application onto the skin daily. 1 pump under each arm once daily      . zolpidem (AMBIEN) 5 MG tablet Take 10 mg by mouth at bedtime.        No current facility-administered medications on file prior to visit.    No Known Allergies  REVIEW OF SYSTEMS:  (Positives checked otherwise negative)  CARDIOVASCULAR:  []  chest pain, []  chest pressure, []  palpitations, []  shortness of breath when laying flat, []  shortness of breath with exertion,  []  pain in feet when walking, []  pain in feet when laying flat, []  history of blood clot in veins (DVT), []  history of phlebitis, []  swelling in legs,  []  varicose veins  PULMONARY:  []  productive cough, []  asthma, []  wheezing  NEUROLOGIC:  []  weakness in arms or legs, []  numbness in arms or legs, []  difficulty speaking or slurred speech, []  temporary loss of vision in one eye, []  dizziness  HEMATOLOGIC:  []  bleeding problems, []  problems with blood clotting too easily  MUSCULOSKEL:  []  joint pain, []  joint swelling  GASTROINTEST:  []  vomiting blood, []  blood in stool     GENITOURINARY:  []  burning with urination, []  blood in urine, [x]  CKD not on HD  PSYCHIATRIC:  []  history of major depression  INTEGUMENTARY:  []  rashes, []  ulcers  For VQI Use Only  PRE-ADM LIVING: Home  AMB STATUS: Ambulatory  Physical Examination Filed Vitals:   01/25/13 1131  BP: 125/78  Pulse: 70    LUE: Incision is healed, skin feels warm, hand grip is 5/5, sensation in digits is intact, no palpable thrill, bruit can not be auscultated   Medical Decision Making  [name] is a 62 y.o. year old male who presents s/p failed L RC AVF  I can't hear any bruit though some flow is demonstrated on access duplex.  This fistula is in the process of failing.  I recommend proceeding with a L BC AVF. The patient has scheduling conflicts so it will likely be complete this coming month.  He is going to check his calendar to let us know.  Risk, benefits, and alternatives to access surgery were discussed.  The patient is aware the risks include but are not limited to: bleeding, infection, steal syndrome, nerve damage, ischemic monomelic neuropathy, failure to mature, need for additional procedures, death and stroke.  The patient agrees to proceed forward with the procedure.  Thank you for allowing [place] to participate in this patient's care.  Leonides Sake, MD [facility] [place] Office: [phone] Pager: [phone]  01/25/2013, 12:08 PM

## 2013-02-25 ENCOUNTER — Other Ambulatory Visit: Payer: Self-pay | Admitting: *Deleted

## 2013-02-25 ENCOUNTER — Encounter: Payer: Self-pay | Admitting: *Deleted

## 2013-03-14 ENCOUNTER — Encounter (HOSPITAL_COMMUNITY): Payer: Self-pay | Admitting: *Deleted

## 2013-03-14 NOTE — Progress Notes (Signed)
03/14/13 1751  OBSTRUCTIVE SLEEP APNEA  Have you ever been diagnosed with sleep apnea through a sleep study? No  Do you snore loudly (loud enough to be heard through closed doors)?  0  Do you often feel tired, fatigued, or sleepy during the daytime? 0  Has anyone observed you stop breathing during your sleep? 0  Do you have, or are you being treated for high blood pressure? 1  BMI more than 35 kg/m2? 1  Age over 65 years old? 1  Neck circumference greater than 40 cm/18 inches? 1  Gender: 1  Obstructive Sleep Apnea Score 5  Score 4 or greater  Results sent to PCP

## 2013-03-17 MED ORDER — DEXTROSE 5 % IV SOLN
1.5000 g | INTRAVENOUS | Status: AC
  Administered 2013-03-18: 1.5 g via INTRAVENOUS
  Filled 2013-03-17: qty 1.5

## 2013-03-18 ENCOUNTER — Encounter (HOSPITAL_COMMUNITY)
Admission: RE | Disposition: A | Discharge status: Home / Self Care | Payer: Self-pay | Source: Ambulatory Visit | Attending: Vascular Surgery

## 2013-03-18 ENCOUNTER — Encounter (HOSPITAL_COMMUNITY): Payer: Medicare HMO | Admitting: Anesthesiology

## 2013-03-18 ENCOUNTER — Other Ambulatory Visit: Payer: Self-pay | Admitting: *Deleted

## 2013-03-18 ENCOUNTER — Encounter (HOSPITAL_COMMUNITY): Payer: Self-pay | Admitting: Anesthesiology

## 2013-03-18 ENCOUNTER — Ambulatory Visit (HOSPITAL_COMMUNITY)
Admission: RE | Admit: 2013-03-18 | Discharge: 2013-03-18 | Disposition: A | Discharge status: Home / Self Care | Payer: Medicare HMO | Source: Ambulatory Visit | Attending: Vascular Surgery | Admitting: Vascular Surgery

## 2013-03-18 ENCOUNTER — Ambulatory Visit (HOSPITAL_COMMUNITY): Payer: Medicare HMO

## 2013-03-18 ENCOUNTER — Ambulatory Visit (HOSPITAL_COMMUNITY): Payer: Medicare HMO | Admitting: Anesthesiology

## 2013-03-18 ENCOUNTER — Telehealth: Payer: Self-pay | Admitting: Vascular Surgery

## 2013-03-18 DIAGNOSIS — I129 Hypertensive chronic kidney disease with stage 1 through stage 4 chronic kidney disease, or unspecified chronic kidney disease: Secondary | ICD-10-CM | POA: Insufficient documentation

## 2013-03-18 DIAGNOSIS — N186 End stage renal disease: Secondary | ICD-10-CM

## 2013-03-18 DIAGNOSIS — M129 Arthropathy, unspecified: Secondary | ICD-10-CM | POA: Insufficient documentation

## 2013-03-18 DIAGNOSIS — K219 Gastro-esophageal reflux disease without esophagitis: Secondary | ICD-10-CM | POA: Insufficient documentation

## 2013-03-18 DIAGNOSIS — I2510 Atherosclerotic heart disease of native coronary artery without angina pectoris: Secondary | ICD-10-CM | POA: Insufficient documentation

## 2013-03-18 DIAGNOSIS — I4891 Unspecified atrial fibrillation: Secondary | ICD-10-CM | POA: Insufficient documentation

## 2013-03-18 DIAGNOSIS — N184 Chronic kidney disease, stage 4 (severe): Secondary | ICD-10-CM | POA: Insufficient documentation

## 2013-03-18 DIAGNOSIS — E119 Type 2 diabetes mellitus without complications: Secondary | ICD-10-CM | POA: Insufficient documentation

## 2013-03-18 DIAGNOSIS — Z4931 Encounter for adequacy testing for hemodialysis: Secondary | ICD-10-CM

## 2013-03-18 DIAGNOSIS — E785 Hyperlipidemia, unspecified: Secondary | ICD-10-CM | POA: Insufficient documentation

## 2013-03-18 DIAGNOSIS — Z87891 Personal history of nicotine dependence: Secondary | ICD-10-CM | POA: Insufficient documentation

## 2013-03-18 HISTORY — PX: AV FISTULA PLACEMENT: SHX1204

## 2013-03-18 LAB — POCT I-STAT 4, (NA,K, GLUC, HGB,HCT)
GLUCOSE: 201 mg/dL — AB (ref 70–99)
Glucose, Bld: 117 mg/dL — ABNORMAL HIGH (ref 70–99)
HCT: 39 % (ref 39.0–52.0)
HCT: 35 % — ABNORMAL LOW (ref 39.0–52.0)
HEMOGLOBIN: 13.3 g/dL (ref 13.0–17.0)
Hemoglobin: 11.9 g/dL — ABNORMAL LOW (ref 13.0–17.0)
POTASSIUM: 4.9 meq/L (ref 3.7–5.3)
Potassium: 4 mEq/L (ref 3.7–5.3)
Sodium: 143 mEq/L (ref 137–147)
Sodium: 133 mEq/L — ABNORMAL LOW (ref 137–147)

## 2013-03-18 LAB — GLUCOSE, CAPILLARY: Glucose-Capillary: 94 mg/dL (ref 70–99)

## 2013-03-18 SURGERY — ARTERIOVENOUS (AV) FISTULA CREATION
Anesthesia: Monitor Anesthesia Care | Site: Arm Upper | Laterality: Left

## 2013-03-18 MED ORDER — LIDOCAINE HCL (CARDIAC) 20 MG/ML IV SOLN
INTRAVENOUS | Status: AC
  Filled 2013-03-18: qty 5

## 2013-03-18 MED ORDER — ROCURONIUM BROMIDE 50 MG/5ML IV SOLN
INTRAVENOUS | Status: AC
  Filled 2013-03-18: qty 1

## 2013-03-18 MED ORDER — SODIUM CHLORIDE 0.9 % IV SOLN
INTRAVENOUS | Status: DC | PRN
  Administered 2013-03-18 (×2): via INTRAVENOUS

## 2013-03-18 MED ORDER — ONDANSETRON HCL 4 MG/2ML IJ SOLN: 4.0000 mg | Freq: Once | INTRAMUSCULAR | Status: DC | PRN

## 2013-03-18 MED ORDER — HYDROMORPHONE HCL PF 1 MG/ML IJ SOLN
0.2500 mg | INTRAMUSCULAR | Status: DC | PRN
Start: 2013-03-18 — End: 2013-03-18

## 2013-03-18 MED ORDER — LIDOCAINE-EPINEPHRINE 0.5 %-1:200000 IJ SOLN
INTRAMUSCULAR | Status: AC
  Filled 2013-03-18: qty 1

## 2013-03-18 MED ORDER — FENTANYL CITRATE 0.05 MG/ML IJ SOLN
INTRAMUSCULAR | Status: DC | PRN
  Administered 2013-03-18 (×5): 50 ug via INTRAVENOUS

## 2013-03-18 MED ORDER — THROMBIN 20000 UNITS EX SOLR
CUTANEOUS | Status: AC
  Filled 2013-03-18: qty 20000

## 2013-03-18 MED ORDER — BUPIVACAINE HCL (PF) 0.5 % IJ SOLN
INTRAMUSCULAR | Status: DC | PRN
  Administered 2013-03-18: 30 mL

## 2013-03-18 MED ORDER — FENTANYL CITRATE 0.05 MG/ML IJ SOLN
INTRAMUSCULAR | Status: AC
  Filled 2013-03-18: qty 5

## 2013-03-18 MED ORDER — ONDANSETRON HCL 4 MG/2ML IJ SOLN
INTRAMUSCULAR | Status: AC
  Filled 2013-03-18: qty 2

## 2013-03-18 MED ORDER — HEPARIN SODIUM (PORCINE) 5000 UNIT/ML IJ SOLN
INTRAMUSCULAR | Status: DC | PRN
  Administered 2013-03-18: 08:00:00

## 2013-03-18 MED ORDER — PROPOFOL 10 MG/ML IV BOLUS
INTRAVENOUS | Status: AC
  Filled 2013-03-18: qty 20

## 2013-03-18 MED ORDER — HYDROMORPHONE HCL PF 1 MG/ML IJ SOLN: 0.2500 mg | INTRAMUSCULAR | Status: DC | PRN

## 2013-03-18 MED ORDER — PHENYLEPHRINE 40 MCG/ML (10ML) SYRINGE FOR IV PUSH (FOR BLOOD PRESSURE SUPPORT)
PREFILLED_SYRINGE | INTRAVENOUS | Status: AC
  Filled 2013-03-18: qty 10

## 2013-03-18 MED ORDER — PROPOFOL 10 MG/ML IV BOLUS
INTRAVENOUS | Status: DC | PRN
  Administered 2013-03-18: 150 mg via INTRAVENOUS
  Administered 2013-03-18: 20 mg via INTRAVENOUS
  Administered 2013-03-18: 10 mg via INTRAVENOUS
  Administered 2013-03-18: 20 mg via INTRAVENOUS

## 2013-03-18 MED ORDER — 0.9 % SODIUM CHLORIDE (POUR BTL) OPTIME
TOPICAL | Status: DC | PRN
  Administered 2013-03-18: 1000 mL

## 2013-03-18 MED ORDER — PHENYLEPHRINE HCL 10 MG/ML IJ SOLN
INTRAMUSCULAR | Status: DC | PRN
  Administered 2013-03-18 (×3): 80 ug via INTRAVENOUS
  Administered 2013-03-18: 40 ug via INTRAVENOUS
  Administered 2013-03-18: 80 ug via INTRAVENOUS
  Administered 2013-03-18: 40 ug via INTRAVENOUS

## 2013-03-18 MED ORDER — OXYCODONE-ACETAMINOPHEN 5-325 MG PO TABS: 1.0000 | ORAL_TABLET | Freq: Four times a day (QID) | ORAL | Status: DC | PRN

## 2013-03-18 MED ORDER — ARTIFICIAL TEARS OP OINT
TOPICAL_OINTMENT | OPHTHALMIC | Status: AC
  Filled 2013-03-18: qty 3.5

## 2013-03-18 MED ORDER — LIDOCAINE-EPINEPHRINE (PF) 1 %-1:200000 IJ SOLN
INTRAMUSCULAR | Status: DC | PRN
  Administered 2013-03-18: 30 mL

## 2013-03-18 SURGICAL SUPPLY — 46 items
ADH SKN CLS APL DERMABOND .7 (GAUZE/BANDAGES/DRESSINGS) ×1
ADH SKN CLS LQ APL DERMABOND (GAUZE/BANDAGES/DRESSINGS) ×1
ARMBAND PINK RESTRICT EXTREMIT (MISCELLANEOUS) ×3 IMPLANT
CANISTER SUCTION 2500CC (MISCELLANEOUS) ×3 IMPLANT
CLIP TI MEDIUM 6 (CLIP) ×3 IMPLANT
CLIP TI WIDE RED SMALL 6 (CLIP) ×3 IMPLANT
COVER PROBE W GEL 5X96 (DRAPES) ×2 IMPLANT
COVER SURGICAL LIGHT HANDLE (MISCELLANEOUS) ×3 IMPLANT
DECANTER SPIKE VIAL GLASS SM (MISCELLANEOUS) ×3 IMPLANT
DERMABOND ADHESIVE PROPEN (GAUZE/BANDAGES/DRESSINGS) ×2
DERMABOND ADVANCED (GAUZE/BANDAGES/DRESSINGS) ×2
DERMABOND ADVANCED .7 DNX12 (GAUZE/BANDAGES/DRESSINGS) ×1 IMPLANT
DERMABOND ADVANCED .7 DNX6 (GAUZE/BANDAGES/DRESSINGS) IMPLANT
ELECT REM PT RETURN 9FT ADLT (ELECTROSURGICAL) ×3
ELECTRODE REM PT RTRN 9FT ADLT (ELECTROSURGICAL) ×1 IMPLANT
GLOVE BIO SURGEON STRL SZ 6.5 (GLOVE) ×1 IMPLANT
GLOVE BIO SURGEON STRL SZ7 (GLOVE) ×3 IMPLANT
GLOVE BIO SURGEONS STRL SZ 6.5 (GLOVE) ×1
GLOVE BIOGEL PI IND STRL 6.5 (GLOVE) IMPLANT
GLOVE BIOGEL PI IND STRL 7.5 (GLOVE) ×1 IMPLANT
GLOVE BIOGEL PI INDICATOR 6.5 (GLOVE) ×2
GLOVE BIOGEL PI INDICATOR 7.5 (GLOVE) ×4
GLOVE SKINSENSE NS SZ7.0 (GLOVE) ×2
GLOVE SKINSENSE STRL SZ7.0 (GLOVE) IMPLANT
GLOVE SS BIOGEL STRL SZ 7 (GLOVE) IMPLANT
GLOVE SUPERSENSE BIOGEL SZ 7 (GLOVE) ×2
GOWN BRE IMP SLV AUR XL STRL (GOWN DISPOSABLE) ×2 IMPLANT
GOWN STRL REUS W/ TWL LRG LVL3 (GOWN DISPOSABLE) ×3 IMPLANT
GOWN STRL REUS W/TWL LRG LVL3 (GOWN DISPOSABLE) ×9
KIT BASIN OR (CUSTOM PROCEDURE TRAY) ×3 IMPLANT
KIT ROOM TURNOVER OR (KITS) ×3 IMPLANT
NDL HYPO 25GX1X1/2 BEV (NEEDLE) ×1 IMPLANT
NEEDLE HYPO 25GX1X1/2 BEV (NEEDLE) ×3 IMPLANT
NS IRRIG 1000ML POUR BTL (IV SOLUTION) ×3 IMPLANT
PACK CV ACCESS (CUSTOM PROCEDURE TRAY) ×3 IMPLANT
PAD ARMBOARD 7.5X6 YLW CONV (MISCELLANEOUS) ×6 IMPLANT
SPONGE SURGIFOAM ABS GEL 100 (HEMOSTASIS) IMPLANT
SUT MNCRL AB 4-0 PS2 18 (SUTURE) ×3 IMPLANT
SUT PROLENE 6 0 BV (SUTURE) IMPLANT
SUT PROLENE 7 0 BV 1 (SUTURE) ×5 IMPLANT
SUT VIC AB 3-0 SH 27 (SUTURE) ×3
SUT VIC AB 3-0 SH 27X BRD (SUTURE) ×1 IMPLANT
TOWEL OR 17X24 6PK STRL BLUE (TOWEL DISPOSABLE) ×3 IMPLANT
TOWEL OR 17X26 10 PK STRL BLUE (TOWEL DISPOSABLE) ×3 IMPLANT
UNDERPAD 30X30 INCONTINENT (UNDERPADS AND DIAPERS) ×3 IMPLANT
WATER STERILE IRR 1000ML POUR (IV SOLUTION) ×3 IMPLANT

## 2013-03-18 NOTE — H&P (Signed)
Brief History and Physical  History of Present Illness  Miguel [name] is a 63 y.o. male who presents with chief complaint: CKD IV.  The patient presents today for L BC AVF.    Past Medical History  Diagnosis Date  . Atrial fibrillation   . Chronic kidney disease   . Hypertension   . Hyperlipidemia   . Diabetes mellitus without complication     no rx  . GERD (gastroesophageal reflux disease)   . Arthritis     Past Surgical History  Procedure Laterality Date  . Back surgery    . Av fistula placement Left 12/24/2012    Procedure: ARTERIOVENOUS (AV) FISTULA CREATION- left radiocephalic ;  Surgeon: Conrad Burney, MD;  Location: [facility] OR;  Service: Vascular;  Laterality: Left;  . Colonoscopy      hx: of  . Cataract extraction w/ intraocular lens implant      Hx: of left eye    History   Social History  . Marital Status: Single    Spouse Name: N/A    Number of Children: N/A  . Years of Education: N/A   Occupational History  . Not on file.   Social History Main Topics  . Smoking status: Former Smoker -- 0.25 packs/day for 30 years    Types: Cigarettes    Quit date: 02/08/2007  . Smokeless tobacco: Never Used     Comment: 09 last alcohol  . Alcohol Use: No  . 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 Sexual Activity: Not on file   Other Topics Concern  . Not on file   Social History Narrative  . No narrative on file    Family History  Problem Relation Age of Onset  . Deep vein thrombosis Mother   . Hypertension Mother   . Clotting disorder Mother   . Peripheral vascular disease Brother     No current facility-administered medications on file prior to encounter.   Current Outpatient Prescriptions on File Prior to Encounter  Medication Sig Dispense Refill  . allopurinol (ZYLOPRIM) 100 MG tablet Take 100 mg by mouth daily.      Marland Kitchen amLODipine (NORVASC) 10 MG tablet Take 10 mg by mouth daily.      Marland Kitchen  amLODipine (NORVASC) 5 MG tablet       . Bisacodyl (LAXATIVE PO) Take 1 tablet by mouth daily.      . calcitRIOL (ROCALTROL) 0.25 MCG capsule Take 0.25 mcg by mouth daily.      . cloNIDine (CATAPRES) 0.1 MG tablet Take 0.1 mg by mouth 2 (two) times daily.       . CRESTOR 40 MG tablet       . furosemide (LASIX) 40 MG tablet Take 40 mg by mouth 2 (two) times daily. Takes at [place] daily.      Marland Kitchen gabapentin (NEURONTIN) 100 MG capsule Take 100 mg by mouth 2 (two) times daily.      . metoprolol (LOPRESSOR) 50 MG tablet Take 50 mg by mouth at bedtime.       . metoprolol succinate (TOPROL-XL) 100 MG 24 hr tablet Take 100 mg by mouth daily. Take with or immediately following a meal.      . omeprazole (PRILOSEC) 20 MG capsule Take 20 mg by mouth 2 (two) times daily.      . rosuvastatin (CRESTOR) 20 MG tablet Take 20  mg by mouth at bedtime.      . sodium bicarbonate 650 MG tablet Take 650 mg by mouth 3 (three) times daily.      . sodium polystyrene (KAYEXALATE) 15 GM/60ML suspension Take 15 g by mouth 1 day or 1 dose.       . sodium polystyrene (KAYEXALATE) powder       . Testosterone (ANDROGEL) 20.25 MG/1.25GM (1.62%) GEL Place 1 application onto the skin daily. 1 pump under each arm once daily      . zolpidem (AMBIEN) 5 MG tablet Take 10 mg by mouth at bedtime.       . ANDROGEL PUMP 20.25 MG/ACT (1.62%) GEL       . donepezil (ARICEPT) 5 MG tablet       . metoprolol succinate (TOPROL-XL) 50 MG 24 hr tablet       . nitroGLYCERIN (NITROSTAT) 0.3 MG SL tablet Place 0.3 mg under the tongue every 5 (five) minutes as needed for chest pain.      Marland Kitchen NITROSTAT 0.4 MG SL tablet       . oxyCODONE (ROXICODONE) 5 MG immediate release tablet Take 1 tablet (5 mg total) by mouth every 4 (four) hours as needed for severe pain.  30 tablet  0    No Known Allergies  Review of Systems: As listed above, otherwise negative.  Physical Examination  Filed Vitals:   03/18/13 0653  BP: 150/94  Pulse: 62  Temp: 98 F  (36.7 C)  Resp: 18  SpO2: 95%    General: A&O x 3, WDWN  Pulmonary: Sym exp, good air movt, CTAB, no rales, rhonchi, & wheezing  Cardiac: RRR, Nl S1, S2, no Murmurs, rubs or gallops  Gastrointestinal: soft, NTND, -G/R, - HSM, - masses, - CVAT B  Musculoskeletal: M/S 5/5 throughout , Extremities without ischemic changes , no palpable thrill or bruit in left forearm  Laboratory See [place] is a 63 y.o. male who presents with: chronic kidney disease stage IV .   The patient is scheduled for: L BC AVF  Risk, benefits, and alternatives to access surgery were discussed.  The patient is aware the risks include but are not limited to: bleeding, infection, steal syndrome, nerve damage, ischemic monomelic neuropathy, failure to mature, and need for additional procedures.  The patient is aware of the risks and agrees to proceed.  Adele Barthel, MD [facility] Office: [phone] Pager: [phone]  03/18/2013, 7:06 AM

## 2013-03-18 NOTE — Progress Notes (Signed)
Spoke with Dr. Tamala Julian regarding the fact that the patient has no one to stay with him overnight.  DA

## 2013-03-18 NOTE — Discharge Instructions (Signed)

## 2013-03-18 NOTE — Anesthesia Preprocedure Evaluation (Signed)
Anesthesia Evaluation  Patient identified by MRN, date of birth, ID band Patient awake    Reviewed: Allergy & Precautions, H&P , NPO status , Patient's Chart, lab work & pertinent test results  Airway       Dental   Pulmonary former smoker,          Cardiovascular hypertension, + CAD     Neuro/Psych    GI/Hepatic GERD-  ,  Endo/Other  diabetes  Renal/GU ESRF, Dialysis and CRFRenal disease     Musculoskeletal   Abdominal   Peds  Hematology   Anesthesia Other Findings   Reproductive/Obstetrics                           Anesthesia Physical Anesthesia Plan  ASA: III  Anesthesia Plan: MAC and General   Post-op Pain Management:    Induction: Intravenous  Airway Management Planned: Simple Face Mask and LMA  Additional Equipment:   Intra-op Plan:   Post-operative Plan: Extubation in OR  Informed Consent: I have reviewed the patients History and Physical, chart, labs and discussed the procedure including the risks, benefits and alternatives for the proposed anesthesia with the patient or authorized representative who has indicated his/her understanding and acceptance.     Plan Discussed with:   Anesthesia Plan Comments:         Anesthesia Quick Evaluation

## 2013-03-18 NOTE — Telephone Encounter (Addendum)
Message copied by Gena Fray on Mon Mar 18, 2013  4:16 PM ------      Message from: Denman George      Created: Mon Mar 18, 2013 12:34 PM      Regarding: Micheline Rough                   ----- Message -----         From: Conrad [place], MD         Sent: 03/18/2013   8:53 AM           To: Patrici Ranks, Vvs Charge [facility] [place]      [phone]      [birth year]            PROCEDURE:      left brachiocephalic arteriovenous fistula placement            Follow-up: 6 weeks ------  03/18/13: spoke with patient, dpm

## 2013-03-18 NOTE — Preoperative (Signed)
Beta Blockers   Reason not to administer Beta Blockers:Not Applicable 

## 2013-03-18 NOTE — Transfer of Care (Signed)
Immediate Anesthesia Transfer of Care Note  Patient: Miguel [name]  Procedure(s) Performed: Procedure(s): Creation ARTERIOVENOUS FISTULA  left arm (Left)  Patient Location: PACU  Anesthesia Type:General  Level of Consciousness: awake, alert , oriented and sedated  Airway & Oxygen Therapy: Patient Spontanous Breathing and Patient connected to nasal cannula oxygen  Post-op Assessment: Report given to PACU RN, Post -op Vital signs reviewed and stable and Patient moving all extremities  Post vital signs: Reviewed and stable  Complications: No apparent anesthesia complications

## 2013-03-18 NOTE — Anesthesia Postprocedure Evaluation (Signed)
  Anesthesia Post-op Note  Patient: Miguel [name]  Procedure(s) Performed: Procedure(s): Creation ARTERIOVENOUS FISTULA  left arm (Left)  Patient Location: PACU  Anesthesia Type:General  Level of Consciousness: awake, alert , oriented and patient cooperative  Airway and Oxygen Therapy: Patient Spontanous Breathing  Post-op Pain: mild  Post-op Assessment: Post-op Vital signs reviewed, Patient's Cardiovascular Status Stable, Respiratory Function Stable, Patent Airway, No signs of Nausea or vomiting and Pain level controlled  Post-op Vital Signs: stable  Complications: No apparent anesthesia complications

## 2013-03-18 NOTE — Op Note (Signed)
OPERATIVE NOTE   PROCEDURE: left brachiocephalic arteriovenous fistula placement  PRE-OPERATIVE DIAGNOSIS: chronic kidney disease stage IV   POST-OPERATIVE DIAGNOSIS: same as above   SURGEON: Adele Barthel, MD  ANESTHESIA: general  ESTIMATED BLOOD LOSS: 30 cc  FINDING(S): 1.  High brachial artery bifurcation 2. Palpable thrill in fistula 3.  Dopplerable left radial signal at end of case  SPECIMEN(S):  none  INDICATIONS:   Miguel [name] is a 63 y.o. male who presents with chronic kidney disease stage IV.  The patient is scheduled for left brachiocephalic arteriovenous fistula placement.  The patient is aware the risks include but are not limited to: bleeding, infection, steal syndrome, nerve damage, ischemic monomelic neuropathy, failure to mature, and need for additional procedures.  The patient is aware of the risks of the procedure and elects to proceed forward.  DESCRIPTION: After full informed written consent was obtained from the patient, the patient was brought back to the operating room and placed supine upon the operating table.  Prior to induction, the patient received IV antibiotics.   After obtaining adequate anesthesia, the patient was then prepped and draped in the standard fashion for a left arm access procedure.  I turned my attention first to identifying the patient's cephalic vein and brachial artery.  Using SonoSite guidance, the location of these vessels were marked out on the skin.   At this point, I injected local anesthetic to obtain a field block of the antecubitum.  In total, I injected about 10 mL of a 1:1 mixture of 0.5% Marcaine without epinephrine and 1% lidocaine with epinephrine.  I made a transverse incision at the level of the antecubitum and dissected through the subcutaneous tissue and fascia to gain exposure of the brachial artery.  This was noted to be 3 mm in diameter externally.  This was dissected out proximally and distally and controlled with  vessel loops .  I then dissected out the cephalic vein.  This was noted to be 3 mm in diameter externally.  The distal segment of the vein was ligated with a  2-0 silk, and the vein was transected.  The proximal segment was iinterrogated with serial dilators.  The vein accepted up to a 3 mm dilator without any difficulty.  I then instilled the heparinized saline into the vein and clamped it.  At this point, I reset my exposure of the brachial artery and placed the artery under tension proximally and distally.  I made an arteriotomy with a #11 blade, and then I extended the arteriotomy with a Potts scissor.  I injected heparinized saline proximal and distal to this arteriotomy.  The vein was then sewn to the artery in an end-to-side configuration with a running stitch of 7-0 Prolene.  Prior to completing this anastomosis, I allowed the vein and artery to backbleed.  There was no evidence of clot from any vessels.  I completed the anastomosis in the usual fashion and then released all vessel loops and clamps.  There was a palpable thrill in the venous outflow, and there was a dopplerable radial signal.  At this point, I irrigated out the surgical wound.  There was no further active bleeding.  The subcutaneous tissue was reapproximated with a running stitch of 3-0 Vicryl.  The skin was then reapproximated with a running subcuticular stitch of 4-0 Vicryl.  The skin was then cleaned, dried, and reinforced with Dermabond.  The patient tolerated this procedure well.   COMPLICATIONS: none  CONDITION: stable  Adele Barthel, MD [facility] Office: [phone] Pager: [phone]  03/18/2013, 8:50 AM

## 2013-03-20 ENCOUNTER — Other Ambulatory Visit: Payer: Self-pay | Admitting: Internal Medicine

## 2013-03-20 DIAGNOSIS — E291 Testicular hypofunction: Secondary | ICD-10-CM

## 2013-03-21 ENCOUNTER — Encounter (HOSPITAL_COMMUNITY): Payer: Self-pay | Admitting: Vascular Surgery

## 2013-03-25 ENCOUNTER — Other Ambulatory Visit: Payer: Medicare HMO

## 2013-04-01 ENCOUNTER — Ambulatory Visit
Admission: RE | Admit: 2013-04-01 | Discharge: 2013-04-01 | Disposition: A | Discharge status: Home / Self Care | Payer: Commercial Managed Care - HMO | Source: Ambulatory Visit | Attending: Internal Medicine | Admitting: Internal Medicine

## 2013-04-01 ENCOUNTER — Other Ambulatory Visit: Payer: Self-pay | Admitting: Internal Medicine

## 2013-04-01 DIAGNOSIS — E291 Testicular hypofunction: Secondary | ICD-10-CM

## 2013-05-02 ENCOUNTER — Encounter: Payer: Self-pay | Admitting: Vascular Surgery

## 2013-05-03 ENCOUNTER — Ambulatory Visit (HOSPITAL_COMMUNITY)
Admission: RE | Admit: 2013-05-03 | Discharge: 2013-05-03 | Disposition: A | Discharge status: Home / Self Care | Payer: Medicare HMO | Source: Ambulatory Visit | Attending: Vascular Surgery | Admitting: Vascular Surgery

## 2013-05-03 ENCOUNTER — Ambulatory Visit (INDEPENDENT_AMBULATORY_CARE_PROVIDER_SITE_OTHER): Payer: Commercial Managed Care - HMO | Admitting: Vascular Surgery

## 2013-05-03 ENCOUNTER — Encounter: Payer: Self-pay | Admitting: Vascular Surgery

## 2013-05-03 VITALS — BP 141/80 | HR 66 | Ht 75.0 in | Wt 297.0 lb

## 2013-05-03 DIAGNOSIS — N186 End stage renal disease: Secondary | ICD-10-CM

## 2013-05-03 DIAGNOSIS — Z4931 Encounter for adequacy testing for hemodialysis: Secondary | ICD-10-CM | POA: Insufficient documentation

## 2013-05-03 DIAGNOSIS — N184 Chronic kidney disease, stage 4 (severe): Secondary | ICD-10-CM

## 2013-05-03 NOTE — Progress Notes (Signed)
    Postoperative Access Visit   History of Present Illness  Chief Walkup Dittman is a 63 y.o. year old male who presents for postoperative follow-up for: L BC AVF (Date: 03/18/13).  The patient's wounds are healed.  The patient notes no steal symptoms.  The patient is able to complete their activities of daily living.  The patient's current symptoms are: none.  For VQI Use Only  PRE-ADM LIVING: Home  AMB STATUS: Ambulatory  Physical Examination Filed Vitals:   05/03/13 1220  BP: 141/80  Pulse: 66    LUE: Incision is healed, skin feels warm, hand grip is 5/5, sensation in digits is intact, palpable thrill, bruit can be auscultated , RC incision healed  L arm access duplex (05/03/2013)  Diameter: > 7 mm throughout  Depth: <6 mm throughout  No significant stenoses  One large branch  Medical Decision Making  Richy Spradley Dorner is a 63 y.o. year old male who presents s/p L BC AVF.  The patient's access is ready for used.  Thank you for allowing [place] to participate in this patient's care.  Adele Barthel, MD [facility] Office: [phone] Pager: [phone]  05/03/2013, 12:28 PM

## 2014-05-30 ENCOUNTER — Other Ambulatory Visit (HOSPITAL_COMMUNITY): Payer: Self-pay | Admitting: Cardiology

## 2014-05-30 DIAGNOSIS — R079 Chest pain, unspecified: Secondary | ICD-10-CM

## 2014-06-11 ENCOUNTER — Encounter (HOSPITAL_COMMUNITY)
Admission: RE | Admit: 2014-06-11 | Discharge: 2014-06-11 | Disposition: A | Discharge status: Home / Self Care | Payer: Commercial Managed Care - HMO | Source: Ambulatory Visit | Attending: Cardiology | Admitting: Cardiology

## 2014-06-11 DIAGNOSIS — R079 Chest pain, unspecified: Secondary | ICD-10-CM

## 2014-06-11 MED ORDER — TECHNETIUM TC 99M SESTAMIBI GENERIC - CARDIOLITE
30.0000 | Freq: Once | INTRAVENOUS | Status: AC | PRN
  Administered 2014-06-11: 30 via INTRAVENOUS

## 2014-06-11 MED ORDER — REGADENOSON 0.4 MG/5ML IV SOLN
0.4000 mg | Freq: Once | INTRAVENOUS | Status: AC
  Administered 2014-06-11: 0.4 mg via INTRAVENOUS

## 2014-06-11 MED ORDER — REGADENOSON 0.4 MG/5ML IV SOLN
INTRAVENOUS | Status: AC
  Filled 2014-06-11: qty 5

## 2014-06-11 NOTE — Progress Notes (Signed)
Per Dr. Terrence Dupont is OK to leave PIV in since it was difficult to obtain and reuse tomorrow for 2nd part of the stress test.  Curlene Dolphin Med Tech  aware/requested from Dr. Terrence Dupont.

## 2014-06-12 ENCOUNTER — Encounter (HOSPITAL_COMMUNITY)
Admission: RE | Admit: 2014-06-12 | Discharge: 2014-06-12 | Disposition: A | Discharge status: Home / Self Care | Payer: Commercial Managed Care - HMO | Source: Ambulatory Visit | Attending: Cardiology | Admitting: Cardiology

## 2014-06-12 ENCOUNTER — Encounter (HOSPITAL_COMMUNITY): Admission: RE | Admit: 2014-06-12 | Payer: Commercial Managed Care - HMO | Source: Ambulatory Visit

## 2014-06-12 DIAGNOSIS — R079 Chest pain, unspecified: Secondary | ICD-10-CM | POA: Diagnosis not present

## 2014-06-12 MED ORDER — TECHNETIUM TC 99M SESTAMIBI GENERIC - CARDIOLITE
30.0000 | Freq: Once | INTRAVENOUS | Status: AC | PRN
  Administered 2014-06-12: 30 via INTRAVENOUS

## 2015-08-24 ENCOUNTER — Emergency Department (HOSPITAL_COMMUNITY): Payer: Medicare Other

## 2015-08-24 ENCOUNTER — Inpatient Hospital Stay (HOSPITAL_COMMUNITY)
Admission: EM | Admit: 2015-08-24 | Discharge: 2015-08-26 | DRG: 640 | Disposition: A | Discharge status: Home / Self Care | Payer: Medicare Other | Attending: Internal Medicine | Admitting: Internal Medicine

## 2015-08-24 ENCOUNTER — Encounter (HOSPITAL_COMMUNITY): Payer: Self-pay | Admitting: *Deleted

## 2015-08-24 DIAGNOSIS — M160 Bilateral primary osteoarthritis of hip: Secondary | ICD-10-CM | POA: Diagnosis not present

## 2015-08-24 DIAGNOSIS — Z961 Presence of intraocular lens: Secondary | ICD-10-CM | POA: Diagnosis not present

## 2015-08-24 DIAGNOSIS — R296 Repeated falls: Secondary | ICD-10-CM | POA: Diagnosis not present

## 2015-08-24 DIAGNOSIS — Z87891 Personal history of nicotine dependence: Secondary | ICD-10-CM

## 2015-08-24 DIAGNOSIS — I120 Hypertensive chronic kidney disease with stage 5 chronic kidney disease or end stage renal disease: Secondary | ICD-10-CM | POA: Diagnosis not present

## 2015-08-24 DIAGNOSIS — I509 Heart failure, unspecified: Secondary | ICD-10-CM | POA: Diagnosis present

## 2015-08-24 DIAGNOSIS — Y92099 Unspecified place in other non-institutional residence as the place of occurrence of the external cause: Secondary | ICD-10-CM

## 2015-08-24 DIAGNOSIS — E785 Hyperlipidemia, unspecified: Secondary | ICD-10-CM | POA: Diagnosis not present

## 2015-08-24 DIAGNOSIS — I132 Hypertensive heart and chronic kidney disease with heart failure and with stage 5 chronic kidney disease, or end stage renal disease: Secondary | ICD-10-CM | POA: Diagnosis not present

## 2015-08-24 DIAGNOSIS — M21372 Foot drop, left foot: Secondary | ICD-10-CM | POA: Diagnosis not present

## 2015-08-24 DIAGNOSIS — E1142 Type 2 diabetes mellitus with diabetic polyneuropathy: Secondary | ICD-10-CM | POA: Diagnosis not present

## 2015-08-24 DIAGNOSIS — I480 Paroxysmal atrial fibrillation: Secondary | ICD-10-CM | POA: Diagnosis present

## 2015-08-24 DIAGNOSIS — M170 Bilateral primary osteoarthritis of knee: Secondary | ICD-10-CM | POA: Diagnosis not present

## 2015-08-24 DIAGNOSIS — N401 Enlarged prostate with lower urinary tract symptoms: Secondary | ICD-10-CM | POA: Diagnosis not present

## 2015-08-24 DIAGNOSIS — I10 Essential (primary) hypertension: Secondary | ICD-10-CM | POA: Diagnosis present

## 2015-08-24 DIAGNOSIS — E875 Hyperkalemia: Secondary | ICD-10-CM | POA: Diagnosis not present

## 2015-08-24 DIAGNOSIS — N186 End stage renal disease: Secondary | ICD-10-CM | POA: Diagnosis not present

## 2015-08-24 DIAGNOSIS — K219 Gastro-esophageal reflux disease without esophagitis: Secondary | ICD-10-CM | POA: Diagnosis present

## 2015-08-24 DIAGNOSIS — R531 Weakness: Secondary | ICD-10-CM | POA: Diagnosis not present

## 2015-08-24 DIAGNOSIS — D649 Anemia, unspecified: Secondary | ICD-10-CM | POA: Diagnosis present

## 2015-08-24 DIAGNOSIS — Z992 Dependence on renal dialysis: Secondary | ICD-10-CM

## 2015-08-24 DIAGNOSIS — M21371 Foot drop, right foot: Secondary | ICD-10-CM | POA: Diagnosis present

## 2015-08-24 DIAGNOSIS — N179 Acute kidney failure, unspecified: Secondary | ICD-10-CM | POA: Insufficient documentation

## 2015-08-24 DIAGNOSIS — E1122 Type 2 diabetes mellitus with diabetic chronic kidney disease: Secondary | ICD-10-CM | POA: Diagnosis present

## 2015-08-24 DIAGNOSIS — I639 Cerebral infarction, unspecified: Secondary | ICD-10-CM

## 2015-08-24 DIAGNOSIS — Z9842 Cataract extraction status, left eye: Secondary | ICD-10-CM | POA: Diagnosis not present

## 2015-08-24 DIAGNOSIS — Z79899 Other long term (current) drug therapy: Secondary | ICD-10-CM

## 2015-08-24 DIAGNOSIS — M109 Gout, unspecified: Secondary | ICD-10-CM | POA: Diagnosis present

## 2015-08-24 DIAGNOSIS — D164 Benign neoplasm of bones of skull and face: Secondary | ICD-10-CM | POA: Diagnosis present

## 2015-08-24 DIAGNOSIS — F0390 Unspecified dementia without behavioral disturbance: Secondary | ICD-10-CM | POA: Diagnosis not present

## 2015-08-24 DIAGNOSIS — N185 Chronic kidney disease, stage 5: Secondary | ICD-10-CM | POA: Diagnosis present

## 2015-08-24 DIAGNOSIS — R338 Other retention of urine: Secondary | ICD-10-CM | POA: Diagnosis present

## 2015-08-24 DIAGNOSIS — W010XXA Fall on same level from slipping, tripping and stumbling without subsequent striking against object, initial encounter: Secondary | ICD-10-CM | POA: Diagnosis present

## 2015-08-24 HISTORY — DX: Paroxysmal atrial fibrillation: I48.0

## 2015-08-24 HISTORY — DX: Chronic kidney disease, stage 5: N18.5

## 2015-08-24 HISTORY — DX: Hyperlipidemia, unspecified: E78.5

## 2015-08-24 HISTORY — DX: Essential (primary) hypertension: I10

## 2015-08-24 HISTORY — DX: Hypertensive chronic kidney disease with stage 5 chronic kidney disease or end stage renal disease: I12.0

## 2015-08-24 LAB — CBC WITH DIFFERENTIAL/PLATELET
BASOS PCT: 0 %
Basophils Absolute: 0 10*3/uL (ref 0.0–0.1)
EOS ABS: 0.3 10*3/uL (ref 0.0–0.7)
Eosinophils Relative: 5 %
HCT: 36.6 % — ABNORMAL LOW (ref 39.0–52.0)
HEMOGLOBIN: 11.2 g/dL — AB (ref 13.0–17.0)
Lymphocytes Relative: 20 %
Lymphs Abs: 1.3 10*3/uL (ref 0.7–4.0)
MCH: 29.2 pg (ref 26.0–34.0)
MCHC: 30.6 g/dL (ref 30.0–36.0)
MCV: 95.3 fL (ref 78.0–100.0)
MONOS PCT: 7 %
Monocytes Absolute: 0.5 10*3/uL (ref 0.1–1.0)
NEUTROS PCT: 68 %
Neutro Abs: 4.4 10*3/uL (ref 1.7–7.7)
Platelets: 226 10*3/uL (ref 150–400)
RBC: 3.84 MIL/uL — ABNORMAL LOW (ref 4.22–5.81)
RDW: 14.2 % (ref 11.5–15.5)
WBC: 6.6 10*3/uL (ref 4.0–10.5)

## 2015-08-24 LAB — CBC
HEMATOCRIT: 38.1 % — AB (ref 39.0–52.0)
Hemoglobin: 11.9 g/dL — ABNORMAL LOW (ref 13.0–17.0)
MCH: 29.5 pg (ref 26.0–34.0)
MCHC: 31.2 g/dL (ref 30.0–36.0)
MCV: 94.5 fL (ref 78.0–100.0)
PLATELETS: 243 10*3/uL (ref 150–400)
RBC: 4.03 MIL/uL — ABNORMAL LOW (ref 4.22–5.81)
RDW: 14 % (ref 11.5–15.5)
WBC: 8.2 10*3/uL (ref 4.0–10.5)

## 2015-08-24 LAB — BASIC METABOLIC PANEL
Anion gap: 9 (ref 5–15)
BUN: 84 mg/dL — ABNORMAL HIGH (ref 6–20)
CALCIUM: 8.5 mg/dL — AB (ref 8.9–10.3)
CO2: 18 mmol/L — AB (ref 22–32)
CREATININE: 8.03 mg/dL — AB (ref 0.61–1.24)
Chloride: 114 mmol/L — ABNORMAL HIGH (ref 101–111)
GFR, EST AFRICAN AMERICAN: 7 mL/min — AB (ref >60)
GFR, EST NON AFRICAN AMERICAN: 6 mL/min — AB (ref >60)
Glucose, Bld: 88 mg/dL (ref 65–99)
Potassium: 5.9 mmol/L — ABNORMAL HIGH (ref 3.5–5.1)
SODIUM: 141 mmol/L (ref 135–145)

## 2015-08-24 LAB — VITAMIN B12: Vitamin B-12: 574 pg/mL (ref 180–914)

## 2015-08-24 LAB — CREATININE, SERUM
Creatinine, Ser: 8.54 mg/dL — ABNORMAL HIGH (ref 0.61–1.24)
GFR calc Af Amer: 7 mL/min — ABNORMAL LOW (ref >60)
GFR calc non Af Amer: 6 mL/min — ABNORMAL LOW (ref >60)

## 2015-08-24 LAB — PROTIME-INR
INR: 1.08 (ref 0.00–1.49)
Prothrombin Time: 14.2 seconds (ref 11.6–15.2)

## 2015-08-24 LAB — TSH: TSH: 1.883 u[IU]/mL (ref 0.350–4.500)

## 2015-08-24 MED ORDER — CLONIDINE HCL 0.1 MG PO TABS
0.1000 mg | ORAL_TABLET | Freq: Two times a day (BID) | ORAL | Status: DC
  Administered 2015-08-24 – 2015-08-26 (×3): 0.1 mg via ORAL
  Filled 2015-08-24 (×4): qty 1

## 2015-08-24 MED ORDER — HEPARIN SODIUM (PORCINE) 5000 UNIT/ML IJ SOLN
5000.0000 [IU] | Freq: Three times a day (TID) | INTRAMUSCULAR | Status: DC
  Administered 2015-08-24 – 2015-08-26 (×5): 5000 [IU] via SUBCUTANEOUS
  Filled 2015-08-24 (×3): qty 1

## 2015-08-24 MED ORDER — SODIUM CHLORIDE 0.9% FLUSH
3.0000 mL | Freq: Two times a day (BID) | INTRAVENOUS | Status: DC
  Administered 2015-08-25 – 2015-08-26 (×3): 3 mL via INTRAVENOUS

## 2015-08-24 MED ORDER — ROSUVASTATIN CALCIUM 40 MG PO TABS
40.0000 mg | ORAL_TABLET | Freq: Every day | ORAL | Status: DC
  Administered 2015-08-24 – 2015-08-25 (×2): 40 mg via ORAL
  Filled 2015-08-24: qty 2
  Filled 2015-08-24 (×2): qty 1
  Filled 2015-08-24: qty 2
  Filled 2015-08-24: qty 1

## 2015-08-24 MED ORDER — SODIUM POLYSTYRENE SULFONATE 15 GM/60ML PO SUSP
30.0000 g | Freq: Once | ORAL | Status: AC
  Administered 2015-08-24: 30 g via ORAL
  Filled 2015-08-24: qty 120

## 2015-08-24 MED ORDER — FUROSEMIDE 10 MG/ML IJ SOLN
80.0000 mg | Freq: Three times a day (TID) | INTRAMUSCULAR | Status: DC
  Administered 2015-08-24 – 2015-08-26 (×5): 80 mg via INTRAVENOUS
  Filled 2015-08-24 (×5): qty 8

## 2015-08-24 MED ORDER — CALCITRIOL 0.5 MCG PO CAPS
0.5000 ug | ORAL_CAPSULE | ORAL | Status: DC
  Administered 2015-08-25: 0.5 ug via ORAL
  Filled 2015-08-24: qty 1

## 2015-08-24 MED ORDER — CALCITRIOL 0.25 MCG PO CAPS: 0.2500 ug | ORAL_CAPSULE | Freq: Every day | ORAL | Status: DC

## 2015-08-24 MED ORDER — FUROSEMIDE 10 MG/ML IJ SOLN
80.0000 mg | Freq: Once | INTRAMUSCULAR | Status: AC
  Administered 2015-08-24: 80 mg via INTRAVENOUS
  Filled 2015-08-24: qty 8

## 2015-08-24 MED ORDER — ONDANSETRON HCL 4 MG/2ML IJ SOLN: 4.0000 mg | Freq: Four times a day (QID) | INTRAMUSCULAR | Status: DC | PRN

## 2015-08-24 MED ORDER — OXYCODONE HCL 5 MG PO TABS
5.0000 mg | ORAL_TABLET | Freq: Once | ORAL | Status: AC
  Administered 2015-08-24: 5 mg via ORAL
  Filled 2015-08-24: qty 1

## 2015-08-24 MED ORDER — AMLODIPINE BESYLATE 10 MG PO TABS
10.0000 mg | ORAL_TABLET | Freq: Every day | ORAL | Status: DC
  Administered 2015-08-25 – 2015-08-26 (×2): 10 mg via ORAL
  Filled 2015-08-24 (×2): qty 1

## 2015-08-24 MED ORDER — HYDROCODONE-ACETAMINOPHEN 5-325 MG PO TABS
1.0000 | ORAL_TABLET | ORAL | Status: DC | PRN
  Administered 2015-08-25: 2 via ORAL
  Filled 2015-08-24: qty 2

## 2015-08-24 MED ORDER — ONDANSETRON HCL 4 MG PO TABS: 4.0000 mg | ORAL_TABLET | Freq: Four times a day (QID) | ORAL | Status: DC | PRN

## 2015-08-24 MED ORDER — SODIUM BICARBONATE 8.4 % IV SOLN
50.0000 meq | Freq: Once | INTRAVENOUS | Status: DC
  Filled 2015-08-24: qty 50

## 2015-08-24 MED ORDER — NITROGLYCERIN 0.4 MG SL SUBL: 0.4000 mg | SUBLINGUAL_TABLET | SUBLINGUAL | Status: DC | PRN

## 2015-08-24 MED ORDER — SODIUM BICARBONATE 650 MG PO TABS
650.0000 mg | ORAL_TABLET | Freq: Three times a day (TID) | ORAL | Status: DC
  Administered 2015-08-24 – 2015-08-26 (×6): 650 mg via ORAL
  Filled 2015-08-24 (×6): qty 1

## 2015-08-24 MED ORDER — TAMSULOSIN HCL 0.4 MG PO CAPS
0.4000 mg | ORAL_CAPSULE | Freq: Every day | ORAL | Status: DC
  Administered 2015-08-24 – 2015-08-25 (×2): 0.4 mg via ORAL
  Filled 2015-08-24 (×2): qty 1

## 2015-08-24 MED ORDER — DONEPEZIL HCL 5 MG PO TABS
10.0000 mg | ORAL_TABLET | Freq: Every day | ORAL | Status: DC
  Administered 2015-08-24 – 2015-08-25 (×2): 10 mg via ORAL
  Filled 2015-08-24 (×2): qty 2

## 2015-08-24 MED ORDER — METOPROLOL SUCCINATE ER 100 MG PO TB24
100.0000 mg | ORAL_TABLET | Freq: Two times a day (BID) | ORAL | Status: DC
  Administered 2015-08-24: 100 mg via ORAL
  Filled 2015-08-24 (×2): qty 1

## 2015-08-24 MED ORDER — PANTOPRAZOLE SODIUM 40 MG PO TBEC
40.0000 mg | DELAYED_RELEASE_TABLET | Freq: Every day | ORAL | Status: DC
  Administered 2015-08-24 – 2015-08-26 (×3): 40 mg via ORAL
  Filled 2015-08-24 (×3): qty 1

## 2015-08-24 MED ORDER — CALCITRIOL 0.25 MCG PO CAPS
0.2500 ug | ORAL_CAPSULE | ORAL | Status: DC
  Administered 2015-08-26: 0.25 ug via ORAL
  Filled 2015-08-24: qty 1

## 2015-08-24 NOTE — Consult Note (Signed)
Neurology Consult Note  Reason for Consultation: RLE weakness  Requesting provider: Lala Lund, MD  CC: "my right knee gave out and I fell"  HPI: This is a 65 year old right-handed man who presented to the emergency department after he fell at home. The patient is a somewhat vague historian regarding his symptoms. He initially told me that he fell after he got out of his car upon returning home from running errands this morning. However, he later stated that he was able to make it to the steps leading up to his front door but fell when he tried to climb the first step. He reports that his right knee gave way and could no longer support his weight and he collapsed to the ground. There was no associated loss of consciousness. He denies any injury sustained during the fall. He tells me that his right leg feels weaker than normal but is not unable to quantify this in any meaningful way. He goes on to state that he has had chronic weakness in that leg related to some lumbar problems for which he had surgery a few years ago. He states that he has had a severe foot drop on the right for several years and this did not improve after his operation. He reports that he has chronic pain in the right hip in the right knee as well. As a result, he states that he frequently has weakness in that leg and estimates that he has a fall about once per month because his knee will give out on him. He tells me that he has a walker that he should be using at home for ambulation, though states that he does not use this as he should. He reports that he does not use any orthotic support for his right foot drop. He is presently denying any new neurologic concerns and feels that he is at his usual baseline.  PMH:  Past Medical History  Diagnosis Date  . Paroxysmal atrial fibrillation (HCC)   . CKD stage 5 secondary to hypertension ([place])   . Essential hypertension   . Dyslipidemia   . Diabetes mellitus without complication  ([place])     no rx  . GERD (gastroesophageal reflux disease)   . Arthritis     PSH:  Past Surgical History  Procedure Laterality Date  . Back surgery    . Av fistula placement Left 12/24/2012    Procedure: ARTERIOVENOUS (AV) FISTULA CREATION- left radiocephalic ;  Surgeon: Conrad Panacea, MD;  Location: [facility] OR;  Service: Vascular;  Laterality: Left;  . Colonoscopy      hx: of  . Cataract extraction w/ intraocular lens implant      Hx: of left eye  . Av fistula placement Left 03/18/2013    Procedure: Creation ARTERIOVENOUS FISTULA  left arm;  Surgeon: Conrad Hinsdale, MD;  Location: [facility] OR;  Service: Vascular;  Laterality: Left;    Family history: Family History  Problem Relation Age of Onset  . Deep vein thrombosis Mother   . Hypertension Mother   . Clotting disorder Mother   . Peripheral vascular disease Brother     Social history:  Social History   Social History  . Marital Status: Single    Spouse Name: N/A  . Number of Children: N/A  . Years of Education: N/A   Occupational History  . Not on file.   Social History Main Topics  . Smoking status: Former Smoker -- 0.25 packs/day for 30 years  Types: Cigarettes    Quit date: 02/08/2007  . Smokeless tobacco: Never Used     Comment: 09 last alcohol  . Alcohol Use: No  . 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 Sexual Activity: Not on file   Other Topics Concern  . Not on file   Social History Narrative    Current outpatient meds: Current Meds  Medication Sig  . amLODipine (NORVASC) 10 MG tablet Take 10 mg by mouth daily.  . calcitRIOL (ROCALTROL) 0.25 MCG capsule Take 0.25-0.5 mcg by mouth daily. Takes 0.5mcg on even days, and 0.33mcg on odd days  . cinacalcet (SENSIPAR) 30 MG tablet Take 30 mg by mouth daily.  . CRESTOR 40 MG tablet Take 40 mg by mouth daily.   . furosemide (LASIX) 80 MG tablet Take 80 mg by mouth 2 (two) times daily.  Marland Kitchen gabapentin (NEURONTIN) 100  MG capsule Take 100 mg by mouth 2 (two) times daily.  . metoprolol succinate (TOPROL-XL) 100 MG 24 hr tablet Take 100 mg by mouth 2 (two) times daily. Take with or immediately following a meal.  . sodium bicarbonate 650 MG tablet Take 650 mg by mouth 3 (three) times daily.  . sodium polystyrene (KIONEX) 15 GM/60ML suspension Take 7.5 mg by mouth daily.  . tamsulosin (FLOMAX) 0.4 MG CAPS capsule Take 0.4 mg by mouth at bedtime.  . traMADol (ULTRAM) 50 MG tablet Take 50-100 mg by mouth every 6 (six) hours as needed for moderate pain or severe pain.    Current inpatient meds:  Current Facility-Administered Medications  Medication Dose Route Frequency Provider Last Rate Last Dose  . [START ON 08/25/2015] amLODipine (NORVASC) tablet 10 mg  10 mg Oral Daily Thurnell Lose, MD      . Derrill Memo ON 08/25/2015] calcitRIOL (ROCALTROL) capsule 0.5 mcg  0.5 mcg Oral QODAY Thurnell Lose, MD       And  . Derrill Memo ON 08/26/2015] calcitRIOL (ROCALTROL) capsule 0.25 mcg  0.25 mcg Oral QODAY Thurnell Lose, MD      . cloNIDine (CATAPRES) tablet 0.1 mg  0.1 mg Oral BID Thurnell Lose, MD      . donepezil (ARICEPT) tablet 10 mg  10 mg Oral QHS Thurnell Lose, MD      . furosemide (LASIX) injection 80 mg  80 mg Intravenous TID Thurnell Lose, MD      . heparin injection 5,000 Units  5,000 Units Subcutaneous Q8H Thurnell Lose, MD      . HYDROcodone-acetaminophen (NORCO/VICODIN) 5-325 MG per tablet 1-2 tablet  1-2 tablet Oral Q4H PRN Thurnell Lose, MD      . metoprolol succinate (TOPROL-XL) 24 hr tablet 100 mg  100 mg Oral BID Thurnell Lose, MD      . nitroGLYCERIN (NITROSTAT) SL tablet 0.4 mg  0.4 mg Sublingual Q5 min PRN Thurnell Lose, MD      . ondansetron (ZOFRAN) tablet 4 mg  4 mg Oral Q6H PRN Thurnell Lose, MD       Or  . ondansetron (ZOFRAN) injection 4 mg  4 mg Intravenous Q6H PRN Thurnell Lose, MD      . Derrill Memo ON 08/25/2015] pantoprazole (PROTONIX) EC tablet 40 mg  40 mg Oral Daily  Thurnell Lose, MD      . rosuvastatin (CRESTOR) tablet 40 mg  40 mg Oral q1800 Thurnell Lose, MD      .  sodium bicarbonate injection 50 mEq  50 mEq Intravenous Once Thurnell Lose, MD      . sodium bicarbonate tablet 650 mg  650 mg Oral TID Thurnell Lose, MD      . sodium chloride flush (NS) 0.9 % injection 3 mL  3 mL Intravenous Q12H Thurnell Lose, MD      . tamsulosin (FLOMAX) capsule 0.4 mg  0.4 mg Oral QHS Thurnell Lose, MD        Allergies: No Known Allergies  ROS: As per HPI. A full 14-point review of systems was performed and is otherwise unremarkable.  PE:  BP 158/86 mmHg  Pulse 57  Temp(Src) 98 F (36.7 C) (Oral)  Resp 20  Ht 6\' 3"  (1.905 m)  Wt 145.151 kg (320 lb)  BMI 40.00 kg/m2  SpO2 93%  General: WD obese African-American man resting comfortably in bed, no acute distress. AAO x4. Speech clear, mild dysarthria but I suspect this is in part his normal speech pattern and in part related to poor dentition. No aphasia. Follows commands briskly. Affect is bright with congruent mood. Comportment is normal.  HEENT: Normocephalic. Neck supple without LAD. MMM, OP clear. Dentition poor. Sclerae anicteric. Mild conjunctival injection.  CV: Distant, regular. Carotid pulses full and symmetric, no bruits. Distal pulses 2+ and symmetric.  Lungs: CTAB on anterior exam.  Abdomen: Obese, soft, non-distended, non-tender. Bowel sounds present x4.  Extremities: Mild edema noted BLE. Neuro:  CN: Pupils are equal and round. They are symmetrically reactive from 3-->2 mm. EOMI without nystagmus. No reported diplopia. Facial sensation is intact to light touch. Face is symmetric at rest with normal strength and mobility. Hearing is intact to conversational voice. Palate elevates symmetrically and uvula is midline. Voice is normal in tone, pitch and quality. Bilateral SCM and trapezii are 5/5. Tongue is midline with normal bulk and mobility.  Motor: Normal bulk, tone, and strength  with the exception of 4+/5 strength with the R deltoid and B hip flexors; he c/o pain in the R shoulder, R hip, and R knee when these are tested. He has 0/5 R ankle dorsiflexion. No tremor or other abnormal movements. No drift.  Sensation: Intact to light touch and pinprick. Vibration is diminished to midshin on the R and to the ankle on the L.  DTRs: 2+, symmetric with absent ankle jerks bilaterally. Toes downgoing bilaterally. No pathologic reflexes.  Coordination: Finger-to-nose is without dysmetria. Heel-to-shin is limited by body habitus and position in bed. Finger taps are normal in amplitude and speed, no decrement.  Gait: Deferred as he reports he uses a walker at baseline.   Labs:  Lab Results  Component Value Date   WBC 6.6 08/24/2015   HGB 11.2* 08/24/2015   HCT 36.6* 08/24/2015   PLT 226 08/24/2015   GLUCOSE 88 08/24/2015   CHOL * 06/23/2007    240        ATP III CLASSIFICATION:  <200     mg/dL   Desirable  200-239  mg/dL   Borderline High  >=240    mg/dL   High   TRIG 283* 06/23/2007   HDL 30* 06/23/2007   LDLCALC * 06/23/2007    153        Total Cholesterol/HDL:CHD Risk Coronary Heart Disease Risk Table                     Men   Women  1/2 Average Risk   3.4   3.3  ALT 26 12/09/2007   AST 20 12/09/2007   NA 141 08/24/2015   K 5.9* 08/24/2015   CL 114* 08/24/2015   CREATININE 8.03* 08/24/2015   BUN 84* 08/24/2015   CO2 18* 08/24/2015   PSA 1.82 Test Methodology: [employer] PSA 12/06/2007   INR 1.0 06/27/2007   HGBA1C  06/25/2007    5.4 (NOTE)   The ADA recommends the following therapeutic goals for glycemic   control related to Hgb A1C measurement:   Goal of Therapy:   < 7.0% Hgb A1C   Action Suggested:  > 8.0% Hgb A1C   Ref:  Diabetes Care, 22, Suppl. 1, 1999   TSH pending Hgb a1c pending Vitamin B12 pending Folate pending  Imaging:  I have personally and independently reviewed the [facility] without contrast from today. This shows mild diffuse generalized  atrophy but is otherwise unremarkable.    Assessment and Plan:  1. Right lower extremity weakness: This is chronic in nature and by the patient's report has been present for many years. He has a severe right foot drop and what sounds like significant orthopedic pain related to arthritis in the right knee and the right hip. Although he says that his weakness was worse than normal earlier today, he is unable to really describe or characterize this in any meaningful way. Presently, he states that he is back to his usual baseline. He does not have any significant findings on his examination apart from his chronic deficits, namely his foot drop and some proximal weakness that is worse on the right side, likely due to deconditioning with some give way in the shoulder, hip, and knee dur to joint pain. I do not find any evidence that he has suffered an acute neurologic insult. He does not demonstrate any upper motor neuron signs to suggest  cerebral or spinal cord pathology. Some of his falls are likely related to noncompliance given that he is not using his walker as he was instructed. At this point, I do not think any additional neurologic evaluation is indicated. He may benefit from evaluation by physical therapy for any recommendations they may have to help him adapt to these chronic deficits given his frequent falls. His obesity is likely contributing as well and weight loss is recommended.  2. 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 This is most likely related to his underlying chronic renal failure. Additional labs including TSH, glycosylated hemoglobin, vitamin B12, and folate are pending. This could be contributing to his unsteadiness. Consider physical therapy consultation as noted above.  This was discussed with the patient at the time of this visit. He was given the opportunity to ask any  questions and these were addressed to his satisfaction.  At this point, I have no additional recommendations. Neurology will sign off. Please feel free to call with any new questions or concerns. Thank you very much for the opportunity to participate in this patient's care.  Melba Coon, M.D. Neurology

## 2015-08-24 NOTE — ED Provider Notes (Signed)
CSN: [id number]     Arrival date & time 08/24/15  1149 History   First MD Initiated Contact with Patient 08/24/15 1200     Chief Complaint  Patient presents with  . Fall  . Weakness   (Consider location/radiation/quality/duration/timing/severity/associated sxs/prior Treatment) The history is provided by the patient and medical records. No language interpreter was used.     Miguel [name] is a 65 y.o. male  with a PMH of CKD, HTN, HLD, DM, GERD, arthritis who presents to the Emergency Department complaining of right lower extremity weakness that began this morning which led to a fall, landing on his right hip. Patient states he ambulates with walker at baseline. He was able to get into car today, but when he got out, his knee became weak and RLE "gave out" on him, causing him to fall. Denies LOC, head injury. Admits to right hip pain after fall. Also complaining of right knee and lower leg pain which he states has been present for approx. a year. Denies numbness/tingling, shortness of breath, chest pain, back pain.   Past Medical History  Diagnosis Date  . Atrial fibrillation ([place])   . Chronic kidney disease   . Hypertension   . Hyperlipidemia   . Diabetes mellitus without complication ([place])     no rx  . GERD (gastroesophageal reflux disease)   . Arthritis    Past Surgical History  Procedure Laterality Date  . Back surgery    . Av fistula placement Left 12/24/2012    Procedure: ARTERIOVENOUS (AV) FISTULA CREATION- left radiocephalic ;  Surgeon: Conrad Kendall, MD;  Location: [facility] OR;  Service: Vascular;  Laterality: Left;  . Colonoscopy      hx: of  . Cataract extraction w/ intraocular lens implant      Hx: of left eye  . Av fistula placement Left 03/18/2013    Procedure: Creation ARTERIOVENOUS FISTULA  left arm;  Surgeon: Conrad Hoople, MD;  Location: [facility] OR;  Service: Vascular;  Laterality: Left;   Family History  Problem Relation Age of Onset  . Deep vein thrombosis Mother   .  Hypertension Mother   . Clotting disorder Mother   . Peripheral vascular disease Brother    Social History  Substance Use Topics  . Smoking status: Former Smoker -- 0.25 packs/day for 30 years    Types: Cigarettes    Quit date: 02/08/2007  . Smokeless tobacco: Never Used     Comment: 09 last alcohol  . Alcohol Use: No    Review of Systems  Constitutional: Negative for fever and fatigue.  HENT: Negative for congestion.   Eyes: Negative for visual disturbance.  Respiratory: Negative for cough and shortness of breath.   Cardiovascular: Positive for leg swelling (Chronic, no change per patient). Negative for chest pain and palpitations.  Gastrointestinal: Negative for nausea, vomiting and abdominal pain.  Genitourinary: Negative for dysuria.  Musculoskeletal: Positive for myalgias and arthralgias.  Skin: Negative for color change and wound.  Neurological: Negative for syncope and headaches.      Allergies  Review of patient's allergies indicates no known allergies.  Home Medications   Prior to Admission medications   Medication Sig Start Date End Date Taking? Authorizing Provider  allopurinol (ZYLOPRIM) 100 MG tablet Take 200 mg by mouth daily.     Historical Provider, MD  amLODipine (NORVASC) 10 MG tablet Take 10 mg by mouth daily.    Historical Provider, MD  Bisacodyl (LAXATIVE PO) Take 1 tablet by mouth  daily.    Historical Provider, MD  calcitRIOL (ROCALTROL) 0.25 MCG capsule Take 0.5 mcg by mouth 2 (two) times daily.     Historical Provider, MD  cloNIDine (CATAPRES) 0.1 MG tablet Take 0.1 mg by mouth 2 (two) times daily.     Historical Provider, MD  CRESTOR 40 MG tablet  03/15/13   Historical Provider, MD  donepezil (ARICEPT) 5 MG tablet  11/28/12   Historical Provider, MD  furosemide (LASIX) 40 MG tablet Take 40 mg by mouth 2 (two) times daily. Takes at [facility] and 12PM daily.    Historical Provider, MD  gabapentin (NEURONTIN) 100 MG capsule Take 100 mg by mouth 2 (two) times  daily.    Historical Provider, MD  metoprolol (LOPRESSOR) 50 MG tablet Take 50 mg by mouth at bedtime.     Historical Provider, MD  metoprolol succinate (TOPROL-XL) 100 MG 24 hr tablet Take 100 mg by mouth daily. Take with or immediately following a meal.    Historical Provider, MD  metoprolol succinate (TOPROL-XL) 50 MG 24 hr tablet Take 50 mg by mouth at bedtime.  01/11/13   Historical Provider, MD  NITROSTAT 0.4 MG SL tablet Place 0.4 mg under the tongue every 5 (five) minutes as needed for chest pain.  12/20/12   Historical Provider, MD  omeprazole (PRILOSEC) 20 MG capsule Take 20 mg by mouth 2 (two) times daily.    Historical Provider, MD  oxyCODONE (ROXICODONE) 5 MG immediate release tablet Take 1 tablet (5 mg total) by mouth every 4 (four) hours as needed for severe pain. 12/24/12   Nancy Nordmann Roczniak, PA-C  oxyCODONE-acetaminophen (PERCOCET/ROXICET) 5-325 MG per tablet Take 1 tablet by mouth every 6 (six) hours as needed. 03/18/13   Ulyses Amor, PA-C  rosuvastatin (CRESTOR) 20 MG tablet Take 20 mg by mouth at bedtime.    Historical Provider, MD  sodium bicarbonate 650 MG tablet Take 650 mg by mouth 3 (three) times daily.    Historical Provider, MD  Testosterone (ANDROGEL) 20.25 MG/1.25GM (1.62%) GEL Place 1 application onto the skin daily. 1 pump under each arm once daily    Historical Provider, MD  zolpidem (AMBIEN) 5 MG tablet Take 10 mg by mouth at bedtime.     Historical Provider, MD   BP 129/73 mmHg  Pulse 62  Temp(Src) 98.1 F (36.7 C) (Oral)  Resp 17  Ht 6\' 3"  (1.905 m)  Wt 145.151 kg  BMI 40.00 kg/m2  SpO2 94% Physical Exam  Constitutional: He is oriented to person, place, and time. He appears well-developed and well-nourished. No distress.  HENT:  Head: Normocephalic and atraumatic.  Eyes: EOM are normal. Pupils are equal, round, and reactive to light.  Neck: Neck supple.  No midline or paraspinal tenderness. Full ROM without pain.   Cardiovascular: Normal rate, regular  rhythm, normal heart sounds and intact distal pulses.  Exam reveals no gallop and no friction rub.   No murmur heard. Pulmonary/Chest: Effort normal and breath sounds normal. No respiratory distress. He has no wheezes. He has no rales.  Abdominal: Soft. Bowel sounds are normal. He exhibits no distension. There is no tenderness.  Musculoskeletal: He exhibits edema (Bilateral; Right > Left).  Right foot drop. 5/5 muscle strength in bilateral UE and left LE. RLE with 5/5 muscle strength above knee. 3/5 with dorsiflexion. Sensation intact x all four extremities. TTP of right hip and generalized ttp knee. No overlying erythema, ecchymosis.   Neurological: He is alert and oriented to person, place,  and time. No cranial nerve deficit.  Skin: Skin is warm and dry. No rash noted. No erythema.  Nursing note and vitals reviewed.   ED Course  Procedures (including critical care time) Labs Review Labs Reviewed  BASIC METABOLIC PANEL  CBC WITH DIFFERENTIAL/PLATELET    Imaging Review No results found. I have personally reviewed and evaluated these images and lab results as part of my medical decision-making.   EKG Interpretation None      MDM   Final diagnoses:  Weakness   Loyal Buba Shiraishi presents with RLE weakness which led to fall just prior to arrival. No head injury or LOC. No focal neuro deficits on exam. Complaining of right hip, knee, and lower leg pain. X-rays reviewed. Hx of CKD, has not started dialysis but told he would need to start soon. K+ 5.9, Cr 8.03. CXR showing CHF. Nephrology consulted who recommends medical admission with transfer to [facility] as well as 80 of lasix which was ordered. Consulted hospitalist who will admit for further evaluation and management.    Patient discussed with Dr. Alvino Chapel who agrees with treatment plan.   [facility], PA-C 08/24/15 1615  Davonna Belling, MD 08/25/15 [phone]

## 2015-08-24 NOTE — ED Notes (Signed)
Patient not in room, not able to draw labs, pt gone to Xray.

## 2015-08-24 NOTE — ED Notes (Signed)
Pt transferred via [place]. Belongings sent with patient.

## 2015-08-24 NOTE — ED Notes (Signed)
Pt reports bila leg weakness, worse in R leg.  Pt also reports R leg pain x 2 years.  Pt is A&Ox 4.  Swelling noted in bila LE, pitting + 2 in R leg and pitting + 1 in LE.  Pt reports he used to received cortisone injections in his R knee, last injection was last  Month.  Per paramedic, pt needs assist from sitting to standing x 2.

## 2015-08-24 NOTE — ED Notes (Signed)
Bed: [id number] Expected date:  Expected time:  Means of arrival:  Comments: EMS- 64yo M, leg weakness

## 2015-08-24 NOTE — ED Notes (Signed)
Per EMS, pt from home, reports his knees became weak and fell.  EMS reports unsteady gait.  Pt is A&Ox 4.

## 2015-08-24 NOTE — Progress Notes (Signed)
[facility] consulted for home health equipment.  EDCM spoke to patient at bedside.  Patient reports he lives at home with friends who are very supportive.  Patient reports he has walker and a can e at home.  Patient requesting wheelchair, shower chair and side rails for bedside commode at home.  Offered patient choice for dme agency, patient is familiar with [facility], has [place] contact information already.  [facility] informed patient that shower chair may not be covered by his insurance.  EDCM called [place] dme rep for [facility] who reports wheelchair and 3 in 1 will be covered if patient has a Consulting civil engineer.  If patient does not have secondary insurance, copay may be approximately 30 dollars.  He confirms shower bench will not be covered by patient's insurance.  EDCM relayed this information to patient, patient reports, "I can handle that."  Patient reports he has never has home health services before and , "I don't want any."  EDCM provided patient with home health agency list of [place] with contact information for [facility].  Patient to be transferred to [facility].  No further EDCM needs at this time.

## 2015-08-24 NOTE — H&P (Signed)
TRH H&P   Patient Demographics:    Miguel [name], is a 65 y.o. male  MRN: [id number]   DOB - [birth year]  Admit Date - 08/24/2015  Outpatient Primary MD for the patient is Velna Hatchet, MD  Outpatient Specialists:  [facility] , Dr. Cyndy Freeze back surgeon  Patient coming from: Home  Chief Complaint  Patient presents with  . Fall  . Weakness      HPI:    Miguel [name]  is a 65 y.o. male, History of CKD stage V, has left arm AV fistula placed over 8 months ago, running back pain has L4-L5 surgery done in the past by Dr. 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  Patient with above history with chronic right leg pain and foot drop comes to the hospital after experiencing worsening of his right leg weakness since this morning, he woke up this morning and noticed that his right leg below-knee was slightly weaker than its baseline, he stood up and then later during the day he felt his right knee gave up and he fell, he subsequently could not get up without assistance from EMS, was brought to the ER for this reason.  In the ER workup showed CK D stage V with worsening and hyperkalemia, head CT and renal ultrasound were ordered, I was called to admit the patient for possible dialysis requirements and workup for acute on chronic right leg weakness.     Review of systems:    In addition to the HPI above,   No Fever-chills, No Headache, No changes with Vision or hearing, No problems swallowing food or Liquids, No Chest pain, Cough or Shortness of Breath, No Abdominal  pain, No Nausea or Vommitting, Bowel movements are regular, No Blood in stool or Urine, No dysuria, No new skin rashes or bruises, No new joints pains-aches,  No new weakness, tingling, numbness in any extremity, Except right below-knee weakness and right foot drop which is acute on chronic, No recent weight gain or loss, No polyuria, polydypsia or polyphagia, No significant Mental Stressors.  A full 10 point Review of Systems was done, except as stated above, all other Review of Systems were negative.   With Past History of the following :  Past Medical History  Diagnosis Date  . Paroxysmal atrial fibrillation (HCC)   . CKD stage 5 secondary to hypertension ([place])   . Essential hypertension   . Dyslipidemia   . Diabetes mellitus without complication ([place])     no rx  . GERD (gastroesophageal reflux disease)   . Arthritis       Past Surgical History  Procedure Laterality Date  . Back surgery    . Av fistula placement Left 12/24/2012    Procedure: ARTERIOVENOUS (AV) FISTULA CREATION- left radiocephalic ;  Surgeon: Conrad [place], MD;  [facility];  Service: Vascular;  Laterality: Left;  . Colonoscopy      hx: of  . Cataract extraction w/ intraocular lens implant      Hx: of left eye  . Av fistula placement Left 03/18/2013    Procedure: Creation ARTERIOVENOUS FISTULA  left arm;  Surgeon: Conrad [place], MD;  Location: [facility] OR;  Service: Vascular;  Laterality: Left;      Social History:     Social History  Substance Use Topics  . Smoking status: Former Smoker -- 0.25 packs/day for 30 years    Types: Cigarettes    Quit date: 02/08/2007  . Smokeless tobacco: Never Used     Comment: 09 last alcohol  . Alcohol Use: No     Lives - at home, mobile and walks     Family History :     Family History  Problem Relation Age of Onset  . Deep vein thrombosis Mother   . Hypertension Mother   . Clotting disorder Mother   . Peripheral vascular disease Brother        Home  Medications:   Prior to Admission medications   Medication Sig Start Date End Date Taking? Authorizing Provider  amLODipine (NORVASC) 10 MG tablet Take 10 mg by mouth daily.   Yes Historical Provider, MD  calcitRIOL (ROCALTROL) 0.25 MCG capsule Take 0.25-0.5 mcg by mouth daily. Takes 0.9mcg on even days, and 0.3mcg on odd days   Yes Historical Provider, MD  cinacalcet (SENSIPAR) 30 MG tablet Take 30 mg by mouth daily.   Yes Historical Provider, MD  furosemide (LASIX) 80 MG tablet Take 80 mg by mouth 2 (two) times daily.   Yes Historical Provider, MD  gabapentin (NEURONTIN) 100 MG capsule Take 100 mg by mouth 2 (two) times daily.   Yes Historical Provider, MD  metoprolol succinate (TOPROL-XL) 100 MG 24 hr tablet Take 100 mg by mouth 2 (two) times daily. Take with or immediately following a meal.   Yes Historical Provider, MD  sodium bicarbonate 650 MG tablet Take 650 mg by mouth 3 (three) times daily.   Yes Historical Provider, MD  sodium polystyrene (KIONEX) 15 GM/60ML suspension Take 7.5 mg by mouth daily.   Yes Historical Provider, MD  tamsulosin (FLOMAX) 0.4 MG CAPS capsule Take 0.4 mg by mouth at bedtime.   Yes Historical Provider, MD  traMADol (ULTRAM) 50 MG tablet Take 50-100 mg by mouth every 6 (six) hours as needed for moderate pain or severe pain.   Yes Historical Provider, MD  allopurinol (ZYLOPRIM) 100 MG tablet Take 200 mg by mouth daily.     Historical Provider, MD  Bisacodyl (LAXATIVE PO) Take 1 tablet by mouth daily.    Historical Provider, MD  cloNIDine (CATAPRES) 0.1 MG tablet Take 0.1 mg by mouth 2 (two) times daily.     Historical Provider, MD  CRESTOR 40 MG tablet  03/15/13  Historical Provider, MD  donepezil (ARICEPT) 5 MG tablet  11/28/12   Historical Provider, MD  furosemide (LASIX) 40 MG tablet Take 40 mg by mouth 2 (two) times daily. Takes at [facility] and 12PM daily.    Historical Provider, MD  NITROSTAT 0.4 MG SL tablet Place 0.4 mg under the tongue every 5 (five) minutes as  needed for chest pain.  12/20/12   Historical Provider, MD  omeprazole (PRILOSEC) 20 MG capsule Take 20 mg by mouth 2 (two) times daily.    Historical Provider, MD  oxyCODONE (ROXICODONE) 5 MG immediate release tablet Take 1 tablet (5 mg total) by mouth every 4 (four) hours as needed for severe pain. 12/24/12   Nancy Nordmann Roczniak, PA-C  oxyCODONE-acetaminophen (PERCOCET/ROXICET) 5-325 MG per tablet Take 1 tablet by mouth every 6 (six) hours as needed. 03/18/13   Ulyses Amor, PA-C  rosuvastatin (CRESTOR) 20 MG tablet Take 20 mg by mouth at bedtime.    Historical Provider, MD  Testosterone (ANDROGEL) 20.25 MG/1.25GM (1.62%) GEL Place 1 application onto the skin daily. 1 pump under each arm once daily    Historical Provider, MD  zolpidem (AMBIEN) 5 MG tablet Take 10 mg by mouth at bedtime.     Historical Provider, MD     Allergies:    No Known Allergies   Physical Exam:   Vitals  Blood pressure 147/84, pulse 62, temperature 98 F (36.7 C), temperature source Oral, resp. rate 19, height 6\' 3"  (1.905 m), weight 145.151 kg (320 lb), SpO2 95 %.   1. General Middle-aged obese African-American male lying in bed in NAD,    2. Normal affect and insight, Not Suicidal or Homicidal, Awake Alert, Oriented X 3.  3. No F.N deficits, ALL C.Nerves Intact, Strength 5/5 all 4 extremities except R leg below knee 3/5, Sensation intact all 4 extremities, R foot drop,  4. Ears and Eyes appear Normal, Conjunctivae clear, PERRLA. Moist Oral Mucosa.  5. Supple Neck, No JVD, No cervical lymphadenopathy appriciated, No Carotid Bruits.  6. Symmetrical Chest wall movement, Good air movement bilaterally, CTAB.  7. RRR, No Gallops, Rubs or Murmurs, No Parasternal Heave.  8. Positive Bowel Sounds, Abdomen Soft, No tenderness, No organomegaly appriciated,No rebound -guarding or rigidity.  9.  No Cyanosis, Normal Skin Turgor, No Skin Rash or Bruise. 1+ edema  10. Good muscle tone,  joints appear normal , no  effusions, Normal ROM.  11. No Palpable Lymph Nodes in Neck or Axillae      Data Review:    CBC  Recent Labs Lab 08/24/15 1332  WBC 6.6  HGB 11.2*  HCT 36.6*  PLT 226  MCV 95.3  MCH 29.2  MCHC 30.6  RDW 14.2  LYMPHSABS 1.3  MONOABS 0.5  EOSABS 0.3  BASOSABS 0.0   ------------------------------------------------------------------------------------------------------------------  Chemistries   Recent Labs Lab 08/24/15 1332  NA 141  K 5.9*  CL 114*  CO2 18*  GLUCOSE 88  BUN 84*  CREATININE 8.03*  CALCIUM 8.5*   ------------------------------------------------------------------------------------------------------------------ estimated creatinine clearance is 13.9 mL/min (by C-G formula based on Cr of 8.03). ------------------------------------------------------------------------------------------------------------------ No results for input(s): TSH, T4TOTAL, T3FREE, THYROIDAB in the last 72 hours.  Invalid input(s): FREET3  Coagulation profile No results for input(s): INR, PROTIME in the last 168 hours. ------------------------------------------------------------------------------------------------------------------- No results for input(s): DDIMER in the last 72 hours. -------------------------------------------------------------------------------------------------------------------  Cardiac Enzymes No results for input(s): CKMB, TROPONINI, MYOGLOBIN in the last 168 hours.  Invalid input(s): CK ------------------------------------------------------------------------------------------------------------------ No results found for: BNP   ---------------------------------------------------------------------------------------------------------------  Urinalysis    Component Value Date/Time   COLORURINE YELLOW 06/22/2007 1623   APPEARANCEUR CLEAR 06/22/2007 1623   LABSPEC 1.017 06/22/2007 1623   PHURINE 5.0 06/22/2007 1623   GLUCOSEU NEGATIVE 06/22/2007  1623   HGBUR NEGATIVE 06/22/2007 1623   BILIRUBINUR SMALL* 06/22/2007 1623   KETONESUR 15* 06/22/2007 1623   PROTEINUR 100* 06/22/2007 1623   UROBILINOGEN 0.2 06/22/2007 1623   NITRITE NEGATIVE 06/22/2007 1623   LEUKOCYTESUR NEGATIVE 06/22/2007 1623    ----------------------------------------------------------------------------------------------------------------   Imaging Results:    Dg Chest 2 View  08/24/2015  CLINICAL DATA:  Status post fall at home, persistent unsteady gait, history of diabetes, chronic renal insufficiency, atrial fibrillation. EXAM: CHEST  2 VIEW COMPARISON:  PA and lateral chest x-ray of March 18, 2013 FINDINGS: The lungs are adequately inflated and clear. The heart is enlarged. The pulmonary vascularity is engorged centrally. There is tortuosity of the ascending and descending thoracic aorta. There is no pleural effusion. The trachea is midline. The bony thorax exhibits no acute abnormality. IMPRESSION: CHF manifested back cardiac enlargement and mild central pulmonary vascular congestion. No significant interstitial or alveolar edema. No pneumonia. Electronically Signed   By: David  Martinique M.D.   On: 08/24/2015 13:07   Dg Tibia/fibula Right  08/24/2015  CLINICAL DATA:  Fall.  Unsteady gait. EXAM: RIGHT TIBIA AND FIBULA - 2 VIEW COMPARISON:  None. FINDINGS: There is no evidence of fracture or other focal bone lesions. Soft tissues are unremarkable. IMPRESSION: Normal right tibia and fibula. Electronically Signed   By: Marijo Conception, M.D.   On: 08/24/2015 13:09   Dg Knee Complete 4 Views Right  08/24/2015  CLINICAL DATA:  65 year old male with history of weakness and fall onto the knees complaining of right-sided knee pain. Unsteady gait. EXAM: RIGHT KNEE - COMPLETE 4+ VIEW COMPARISON:  No priors. FINDINGS: No acute displaced fracture, subluxation or dislocation. Suprapatellar effusion. Joint space narrowing, subchondral sclerosis, subchondral cyst formation and  osteophyte formation, compatible with moderate tricompartmental osteoarthritis. On the frontal projection there is a possible loose body in the joint space in the region of the intercondylar notch. IMPRESSION: 1. Suprapatellar effusion. 2. Moderate tricompartmental osteoarthritis. 3. Possible loose body in the joint space visualized on only one projection. If there is clinical concern for loose body in the joint, this could be better evaluated with MRI of the knee if clinically appropriate. Electronically Signed   By: Vinnie Langton M.D.   On: 08/24/2015 13:12   Dg Hip Unilat With Pelvis 2-3 Views Right  08/24/2015  CLINICAL DATA:  Unsteadiness of the knees with instability and subsequent fall, unsteady gait. EXAM: DG HIP (WITH OR WITHOUT PELVIS) 2-3V RIGHT COMPARISON:  KUB of February 21, 2006 FINDINGS: The bones are subjectively osteopenic. The sacrum and iliac bones are intact. The pubic rami are intact. AP and lateral views of the right hip reveals no acute fracture nor dislocation. There is mild stable joint space loss superiorly. The femoral neck, intertrochanteric, and subtrochanteric regions are normal. IMPRESSION: Mild degenerative narrowing of the right hip joint. No acute fracture nor dislocation. Electronically Signed   By: David  Martinique M.D.   On: 08/24/2015 13:09    My personal review of EKG: Rhythm NSR,   no Acute ST, T changes   Assessment & Plan:    1. CK D stage V with hyperkalemia. Likely close to ESRD now. He has evidence of fluid overload, have discussed his case with nephrologist Dr. Azzie Roup, he will receive IV Lasix,  Kayexalate, 1 amp of IV bicarbonate, telemetry monitor, transferred to [facility] as he might require dialysis if he does not improve soon. Continue home dose sodium bicarbonate orally. Renal ultrasound ordered in the ER must be followed, being done.  2. Fall due to acute on chronic right leg weakness with chronic right foot drop. His long-standing history of L4-L5 disc  problems, has been operated by Dr. Cyndy Freeze, he has chronic right foot drop. I do not suspect that there is a stroke either brain or spinal, he has no back pain or recent injury, he also has significant osteoarthritis in his right knee and has gotten multiple steroid shots in his right knee. I suspect this is acute on chronic weakness exacerbated by disease progression and deconditioning, compounded by some peripheral neuropathy from diabetes.   I have discussed his case with neurologist Dr. Leonel Ramsay will [facility] the patient [facility]. Will order PT OT as well. Again I think his problems are more chronic with disease progression, check A1c, 0000000, TSH and folic acid as well. CT head ordered in the ER must be followed results are pending.   3. Essential hypertension. Continue home medications at this time. Which include Norvasc, clonidine and beta blocker.  4. Morbid obesity. Follow with PCP for weight reduction.  5. GERD. On PPI.  6. Dyslipidemia. On statin continue.  7. BPH. On Flomax continue.  8. Mild early dementia. Continue Aricept.  9. 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 Supportive care. PTOT.    DVT Prophylaxis Heparin   AM Labs Ordered, also please review Full Orders  Family Communication: Admission, patients condition and plan of care including tests being ordered have been discussed with the patient  who indicates understanding and agree with the plan and Code Status.  Code Status Full  Likely DC to  Home 2-3 days  Condition GUARDED     Consults called: Renal and Neuro   Admission status: Inpatient to [facility]  Time spent in minutes : 35   SINGH,PRASHANT K M.D on 08/24/2015 at 3:29 PM  Between 7am to 7pm - Pager - [phone]. After 7pm go to [email or web address] - password [facility]  [facility] - Office  [phone]

## 2015-08-25 DIAGNOSIS — E875 Hyperkalemia: Secondary | ICD-10-CM | POA: Diagnosis not present

## 2015-08-25 DIAGNOSIS — I10 Essential (primary) hypertension: Secondary | ICD-10-CM

## 2015-08-25 DIAGNOSIS — I480 Paroxysmal atrial fibrillation: Secondary | ICD-10-CM | POA: Diagnosis not present

## 2015-08-25 DIAGNOSIS — N186 End stage renal disease: Secondary | ICD-10-CM

## 2015-08-25 DIAGNOSIS — R531 Weakness: Secondary | ICD-10-CM | POA: Diagnosis not present

## 2015-08-25 DIAGNOSIS — I120 Hypertensive chronic kidney disease with stage 5 chronic kidney disease or end stage renal disease: Secondary | ICD-10-CM | POA: Diagnosis not present

## 2015-08-25 LAB — CBC
HCT: 35.1 % — ABNORMAL LOW (ref 39.0–52.0)
Hemoglobin: 10.7 g/dL — ABNORMAL LOW (ref 13.0–17.0)
MCH: 28.7 pg (ref 26.0–34.0)
MCHC: 30.5 g/dL (ref 30.0–36.0)
MCV: 94.1 fL (ref 78.0–100.0)
PLATELETS: 223 10*3/uL (ref 150–400)
RBC: 3.73 MIL/uL — AB (ref 4.22–5.81)
RDW: 14.2 % (ref 11.5–15.5)
WBC: 7.4 10*3/uL (ref 4.0–10.5)

## 2015-08-25 LAB — BASIC METABOLIC PANEL
Anion gap: 9 (ref 5–15)
BUN: 81 mg/dL — ABNORMAL HIGH (ref 6–20)
CALCIUM: 8.7 mg/dL — AB (ref 8.9–10.3)
CHLORIDE: 111 mmol/L (ref 101–111)
CO2: 20 mmol/L — ABNORMAL LOW (ref 22–32)
CREATININE: 8.38 mg/dL — AB (ref 0.61–1.24)
GFR, EST AFRICAN AMERICAN: 7 mL/min — AB (ref >60)
GFR, EST NON AFRICAN AMERICAN: 6 mL/min — AB (ref >60)
Glucose, Bld: 90 mg/dL (ref 65–99)
Potassium: 6.2 mmol/L — ABNORMAL HIGH (ref 3.5–5.1)
SODIUM: 140 mmol/L (ref 135–145)

## 2015-08-25 LAB — RENAL FUNCTION PANEL
ALBUMIN: 3.2 g/dL — AB (ref 3.5–5.0)
Anion gap: 9 (ref 5–15)
BUN: 78 mg/dL — AB (ref 6–20)
CALCIUM: 9.4 mg/dL (ref 8.9–10.3)
CHLORIDE: 111 mmol/L (ref 101–111)
CO2: 20 mmol/L — ABNORMAL LOW (ref 22–32)
CREATININE: 8.49 mg/dL — AB (ref 0.61–1.24)
GFR calc non Af Amer: 6 mL/min — ABNORMAL LOW (ref >60)
GFR, EST AFRICAN AMERICAN: 7 mL/min — AB (ref >60)
Glucose, Bld: 114 mg/dL — ABNORMAL HIGH (ref 65–99)
PHOSPHORUS: 8.2 mg/dL — AB (ref 2.5–4.6)
Potassium: 5 mmol/L (ref 3.5–5.1)
SODIUM: 140 mmol/L (ref 135–145)

## 2015-08-25 MED ORDER — PATIROMER SORBITEX CALCIUM 8.4 G PO PACK
8.4000 g | PACK | Freq: Every day | ORAL | Status: DC
  Administered 2015-08-25 – 2015-08-26 (×2): 8.4 g via ORAL
  Filled 2015-08-25 (×3): qty 4

## 2015-08-25 MED ORDER — SODIUM POLYSTYRENE SULFONATE 15 GM/60ML PO SUSP: 45.0000 g | Freq: Once | ORAL | Status: DC

## 2015-08-25 MED ORDER — ACETAMINOPHEN 325 MG PO TABS
650.0000 mg | ORAL_TABLET | Freq: Four times a day (QID) | ORAL | Status: DC | PRN
  Administered 2015-08-25: 650 mg via ORAL
  Filled 2015-08-25: qty 2

## 2015-08-25 MED ORDER — CALCIUM GLUCONATE 10 % IV SOLN
1.0000 g | Freq: Once | INTRAVENOUS | Status: AC
  Administered 2015-08-25: 1 g via INTRAVENOUS
  Filled 2015-08-25: qty 10

## 2015-08-25 MED ORDER — METOPROLOL SUCCINATE ER 100 MG PO TB24
100.0000 mg | ORAL_TABLET | Freq: Two times a day (BID) | ORAL | Status: DC
  Administered 2015-08-25 – 2015-08-26 (×2): 100 mg via ORAL
  Filled 2015-08-25 (×2): qty 1

## 2015-08-25 NOTE — Consult Note (Signed)
[facility] Renal Consultation Note  Requesting MD: Leroy Sea, MD Indication for Consultation: Hyperkalemia  HPI:  Mr. Miguel [name] is a 65 year old male with a history of CKD 5 with left upper extremity AVF placed several months ago (mature), DM2, HTN, PAF who initially presented to the ED on 7/17 after a fall. He had worsening right weakness yesterday and fell. Was unable to get up afterwards and EMS was called. Noted to have K of 5.9 on admission and Cr of 8.03. Is followed by Miguel [name] at [facility]- last seen in May- creatinine was near 8- also has been on chronic kaexylate for high K  Patient with complaints of right leg weakness and right knee pain. Reports this is chronic in nature. He denies any current nausea, headaches, chest pain, shortness of breath, worsening edema or any other symptoms. Currently resting comfortably in bed. He does report feeling more fatigued with generalized weakness. No change in appetite. Notes that he was started on Kayexalate at his last nephrology visit for mildly elevated K. Reports constipation with bowel movements every 2-3 days at home. Did have a bowel movement yesterday after receiving kayexalate. After medical treatment of K- unfortunately it went up to 6.2- s/p dose of veltassa this AM.  Patient reports feeling better- no appetite disturbance and does not admit to classic uremic symptoms   PMHx: Past Medical History  Diagnosis Date  . Paroxysmal atrial fibrillation (HCC)   . CKD stage 5 secondary to hypertension (HCC)   . Essential hypertension   . Dyslipidemia   . Diabetes mellitus without complication (HCC)     no rx  . GERD (gastroesophageal reflux disease)   . Arthritis    Past Surgical History  Procedure Laterality Date  . Back surgery    . Av fistula placement Left 12/24/2012    Procedure: ARTERIOVENOUS (AV) FISTULA CREATION- left radiocephalic ;  Surgeon: Fransisco Hertz, MD;  Location: [facility] OR;  Service: Vascular;  Laterality:  Left;  . Colonoscopy      hx: of  . Cataract extraction w/ intraocular lens implant      Hx: of left eye  . Av fistula placement Left 03/18/2013    Procedure: Creation ARTERIOVENOUS FISTULA  left arm;  Surgeon: Fransisco Hertz, MD;  Location: [facility] OR;  Service: Vascular;  Laterality: Left;    Family Hx:  Family History  Problem Relation Age of Onset  . Deep vein thrombosis Mother   . Hypertension Mother   . Clotting disorder Mother   . Peripheral vascular disease Brother     Social History:  reports that he quit smoking about 8 years ago. His smoking use included Cigarettes. He has a 7.5 pack-year smoking history. He has never used smokeless tobacco. He reports that he does not drink alcohol or use illicit drugs.  Allergies: No Known Allergies  Medications: Prior to Admission medications   Medication Sig Start Date End Date Taking? Authorizing Provider  amLODipine (NORVASC) 10 MG tablet Take 10 mg by mouth daily.   Yes Historical Provider, MD  calcitRIOL (ROCALTROL) 0.25 MCG capsule Take 0.25-0.5 mcg by mouth daily. Takes 0.33mcg on even days, and 0.62mcg on odd days   Yes Historical Provider, MD  cinacalcet (SENSIPAR) 30 MG tablet Take 30 mg by mouth daily.   Yes Historical Provider, MD  CRESTOR 40 MG tablet Take 40 mg by mouth daily.  03/15/13  Yes Historical Provider, MD  furosemide (LASIX) 80 MG tablet Take 80 mg by  mouth 2 (two) times daily.   Yes Historical Provider, MD  gabapentin (NEURONTIN) 100 MG capsule Take 100 mg by mouth 2 (two) times daily.   Yes Historical Provider, MD  metoprolol succinate (TOPROL-XL) 100 MG 24 hr tablet Take 100 mg by mouth 2 (two) times daily. Take with or immediately following a meal.   Yes Historical Provider, MD  sodium bicarbonate 650 MG tablet Take 650 mg by mouth 3 (three) times daily.   Yes Historical Provider, MD  sodium polystyrene (KIONEX) 15 GM/60ML suspension Take 7.5 mg by mouth daily.   Yes Historical Provider, MD  tamsulosin (FLOMAX) 0.4 MG  CAPS capsule Take 0.4 mg by mouth at bedtime.   Yes Historical Provider, MD  traMADol (ULTRAM) 50 MG tablet Take 50-100 mg by mouth every 6 (six) hours as needed for moderate pain or severe pain.   Yes Historical Provider, MD  allopurinol (ZYLOPRIM) 100 MG tablet Take 200 mg by mouth daily.     Historical Provider, MD  Bisacodyl (LAXATIVE PO) Take 1 tablet by mouth daily.    Historical Provider, MD  cloNIDine (CATAPRES) 0.1 MG tablet Take 0.1 mg by mouth 2 (two) times daily.     Historical Provider, MD  donepezil (ARICEPT) 5 MG tablet  11/28/12   Historical Provider, MD  NITROSTAT 0.4 MG SL tablet Place 0.4 mg under the tongue every 5 (five) minutes as needed for chest pain.  12/20/12   Historical Provider, MD  omeprazole (PRILOSEC) 20 MG capsule Take 20 mg by mouth 2 (two) times daily.    Historical Provider, MD  oxyCODONE (ROXICODONE) 5 MG immediate release tablet Take 1 tablet (5 mg total) by mouth every 4 (four) hours as needed for severe pain. 12/24/12   Nancy Nordmann Roczniak, PA-C  oxyCODONE-acetaminophen (PERCOCET/ROXICET) 5-325 MG per tablet Take 1 tablet by mouth every 6 (six) hours as needed. 03/18/13   Ulyses Amor, PA-C  rosuvastatin (CRESTOR) 20 MG tablet Take 20 mg by mouth at bedtime.    Historical Provider, MD  Testosterone (ANDROGEL) 20.25 MG/1.25GM (1.62%) GEL Place 1 application onto the skin daily. 1 pump under each arm once daily    Historical Provider, MD  zolpidem (AMBIEN) 5 MG tablet Take 10 mg by mouth at bedtime.     Historical Provider, MD     I have reviewed the patient's current medications.  Labs:  Results for orders placed or performed during the hospital encounter of 08/24/15 (from the past 48 hour(s))  Basic metabolic panel     Status: Abnormal   Collection Time: 08/24/15  1:32 PM  Result Value Ref Range   Sodium 141 135 - 145 mmol/L   Potassium 5.9 (H) 3.5 - 5.1 mmol/L    Comment: NO VISIBLE HEMOLYSIS   Chloride 114 (H) 101 - 111 mmol/L   CO2 18 (L) 22 - 32  mmol/L   Glucose, Bld 88 65 - 99 mg/dL   BUN 84 (H) 6 - 20 mg/dL   Creatinine, Ser 8.03 (H) 0.61 - 1.24 mg/dL   Calcium 8.5 (L) 8.9 - 10.3 mg/dL   GFR calc non Af Amer 6 (L) >60 mL/min   GFR calc Af Amer 7 (L) >60 mL/min    Comment: (NOTE) The eGFR has been calculated using the CKD EPI equation. This calculation has not been validated in all clinical situations. eGFR's persistently <60 mL/min signify possible Chronic Kidney Disease.    Anion gap 9 5 - 15  CBC with Differential  Status: Abnormal   Collection Time: 08/24/15  1:32 PM  Result Value Ref Range   WBC 6.6 4.0 - 10.5 K/uL   RBC 3.84 (L) 4.22 - 5.81 MIL/uL   Hemoglobin 11.2 (L) 13.0 - 17.0 g/dL   HCT 36.6 (L) 39.0 - 52.0 %   MCV 95.3 78.0 - 100.0 fL   MCH 29.2 26.0 - 34.0 pg   MCHC 30.6 30.0 - 36.0 g/dL   RDW 14.2 11.5 - 15.5 %   Platelets 226 150 - 400 K/uL   Neutrophils Relative % 68 %   Neutro Abs 4.4 1.7 - 7.7 K/uL   Lymphocytes Relative 20 %   Lymphs Abs 1.3 0.7 - 4.0 K/uL   Monocytes Relative 7 %   Monocytes Absolute 0.5 0.1 - 1.0 K/uL   Eosinophils Relative 5 %   Eosinophils Absolute 0.3 0.0 - 0.7 K/uL   Basophils Relative 0 %   Basophils Absolute 0.0 0.0 - 0.1 K/uL  CBC     Status: Abnormal   Collection Time: 08/24/15  8:44 PM  Result Value Ref Range   WBC 8.2 4.0 - 10.5 K/uL   RBC 4.03 (L) 4.22 - 5.81 MIL/uL   Hemoglobin 11.9 (L) 13.0 - 17.0 g/dL   HCT 38.1 (L) 39.0 - 52.0 %   MCV 94.5 78.0 - 100.0 fL   MCH 29.5 26.0 - 34.0 pg   MCHC 31.2 30.0 - 36.0 g/dL   RDW 14.0 11.5 - 15.5 %   Platelets 243 150 - 400 K/uL  Creatinine, serum     Status: Abnormal   Collection Time: 08/24/15  8:44 PM  Result Value Ref Range   Creatinine, Ser 8.54 (H) 0.61 - 1.24 mg/dL   GFR calc non Af Amer 6 (L) >60 mL/min   GFR calc Af Amer 7 (L) >60 mL/min    Comment: (NOTE) The eGFR has been calculated using the CKD EPI equation. This calculation has not been validated in all clinical situations. eGFR's persistently  <60 mL/min signify possible Chronic Kidney Disease.   Protime-INR     Status: None   Collection Time: 08/24/15  8:44 PM  Result Value Ref Range   Prothrombin Time 14.2 11.6 - 15.2 seconds   INR 1.08 0.00 - 1.49  TSH     Status: None   Collection Time: 08/24/15  8:44 PM  Result Value Ref Range   TSH 1.883 0.350 - 4.500 uIU/mL  Vitamin B12     Status: None   Collection Time: 08/24/15  8:44 PM  Result Value Ref Range   Vitamin B-12 574 180 - 914 pg/mL    Comment: (NOTE) This assay is not validated for testing neonatal or myeloproliferative syndrome specimens for Vitamin B12 levels.   Basic metabolic panel     Status: Abnormal   Collection Time: 08/25/15  5:02 AM  Result Value Ref Range   Sodium 140 135 - 145 mmol/L   Potassium 6.2 (H) 3.5 - 5.1 mmol/L   Chloride 111 101 - 111 mmol/L   CO2 20 (L) 22 - 32 mmol/L   Glucose, Bld 90 65 - 99 mg/dL   BUN 81 (H) 6 - 20 mg/dL   Creatinine, Ser 8.38 (H) 0.61 - 1.24 mg/dL   Calcium 8.7 (L) 8.9 - 10.3 mg/dL   GFR calc non Af Amer 6 (L) >60 mL/min   GFR calc Af Amer 7 (L) >60 mL/min    Comment: (NOTE) The eGFR has been calculated using the CKD EPI equation.  This calculation has not been validated in all clinical situations. eGFR's persistently <60 mL/min signify possible Chronic Kidney Disease.    Anion gap 9 5 - 15  CBC     Status: Abnormal   Collection Time: 08/25/15  5:02 AM  Result Value Ref Range   WBC 7.4 4.0 - 10.5 K/uL   RBC 3.73 (L) 4.22 - 5.81 MIL/uL   Hemoglobin 10.7 (L) 13.0 - 17.0 g/dL   HCT 35.1 (L) 39.0 - 52.0 %   MCV 94.1 78.0 - 100.0 fL   MCH 28.7 26.0 - 34.0 pg   MCHC 30.5 30.0 - 36.0 g/dL   RDW 14.2 11.5 - 15.5 %   Platelets 223 150 - 400 K/uL   ROS:  Pertinent items noted in HPI and remainder of comprehensive ROS otherwise negative.  Physical Exam: Filed Vitals:   08/25/15 0605 08/25/15 0937  BP: 115/72 101/57  Pulse: 62 60  Temp: 98.5 F (36.9 C) 97.7 F (36.5 C)  Resp: 16 18   General: alert,  well-developed, obese male and cooperative to examination.  Head: normocephalic and atraumatic.  Eyes: vision grossly intact, pupils equal, pupils round, pupils reactive to light, no injection and anicteric.  Mouth: pharynx pink and moist, no erythema, and no exudates.  Neck: supple, full ROM, no thyromegaly, no JVD, and no carotid bruits.  Lungs: normal respiratory effort, no accessory muscle use, normal breath sounds, no crackles, and no wheezes. Heart: normal rate, regular rhythm, no murmur, no gallop, and no rub.  Abdomen: soft, non-tender, normal bowel sounds, no distention Msk: no joint swelling, no joint warmth, and no redness over joints.  Pulses: 2+ DP/PT pulses bilaterally Extremities: No cyanosis, clubbing, 1+ edema  Neurologic: alert & oriented X3, no focal deficits Skin: turgor normal and no rashes.  Psych: normal mood and affect   Assessment/Plan:  1. CKD Stage V with hyperkalemia - Patient denies any symptoms currently. Received 2 doses of IV Lasix 80 mg yesterday with an amp of bicarb as well as kayexalate. K increased from 5.9 to 6.2 today. EKG yesterday with no acute changes, no peaked T waves, ST depression or shortened QT interval. Patient with persistent hyperK following kayexalate yesterday. Received Veltassa this morning. Will recheck K this evening. No acute indications to start dialysis immediately. However, if unable to control K will need to initiate dialysis. Discussed dialysis with patient. He does not wish to start dialysis unless no other options. Will work with PT. If weakness/fatigue not improving will re-address initiating dialysis even if K improves. Continue IV Lasix today.  2. Hypertension - BP soft this morning, 101/57. Currently on amlodipine 10 mg daily, Clonidine 0.1 mg bid and metoprolol 100 mg bid as well as lasix 80 TID. Would hold Clonidine and metoprolol for SBP < 130. Possibly not compliant with BP meds as OP ?? Put hold parameters   3. Anemia  -  Hgb 10.7 today. Continue to monitor.    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 Check phos.   4. Vascular Access - Mature LUE AVF  5. Deconditioning- will benefit from PT/OT  Miguel [name] IMTS PGY-2 [phone]  Patient seen and examined, agree with above note with above modifications. Pleasant  BM followed by nephrology as OP with some baseline hyperkalemia on kaexylate- presents with acute weakness and found to have moderately high K.  He has an access in place and would be ready to start HD, we discussed this... Basically I told him that if we were not able  to control K medically with a level of satisfaction or if he did not make progress with PT then these would be indications to possibly start HD- he understands Miguel Parish, MD 08/25/2015

## 2015-08-25 NOTE — Care Management Important Message (Signed)
Important Message  Patient Details  Name: Miguel [name] MRN: [id number] Date of Birth: November 09, 1950   Medicare Important Message Given:  Yes    Loann Quill 08/25/2015, 8:06 AM

## 2015-08-25 NOTE — Care Management Obs Status (Signed)
[facility] NOTIFICATION   Patient Details  Name: Miguel KANHAI MRN: [id number] Date of Birth: 1950/07/14   Medicare Observation Status Notification Given:  Yes  OBS notification given and Code 44 given and explained.   Dario Yono, Rory Percy, RN 08/25/2015, 3:39 PM

## 2015-08-25 NOTE — Care Management Note (Addendum)
Case Management Note  Patient Details  Name: Miguel [name] MRN: [id number] Date of Birth: 04/21/1950  Subjective/Objective:          CM following for progression and d/c planning.          Action/Plan: 08/25/2015 Noted pt request for dme, including wheelchair, shower chair and rails for bathroom wall. Noted that CM at [facility] ED spoke with pt re DME, unclear if it was explained to pt that we are unable to assist with rails for bathroom walls, will clarify with the pt. Await PT recommendations for DME noted they are currently recommending orthotics . Will followup with PT .   Expected Discharge Date:   (unknown)               Expected Discharge Plan:  [place] pt is currently declining [facility] services.   In-House Referral:  NA  Discharge planning Services  CM Consult  Post Acute Care Choice:  Durable Medical Equipment, Home Health Choice offered to:     DME Arranged:    DME Agency:     HH Arranged:    HH Agency:     Status of Service:  In process, will continue to follow  If discussed at Long Length of Stay Meetings, dates discussed:    Additional Comments:  Adron Bene, RN 08/25/2015, 12:10 PM

## 2015-08-25 NOTE — Care Management CC44 (Signed)
Condition Code 44 Documentation Completed  Patient Details  Name: Miguel [name] MRN: [id number] Date of Birth: 1950-08-26   Condition Code 44 given:   yes Patient signature on Condition Code 44 notice:   yes Documentation of 2 MD's agreement:   yes Code 44 added to claim:   yes    Adron Bene, RN 08/25/2015, 3:39 PM

## 2015-08-25 NOTE — Evaluation (Signed)
Physical Therapy Evaluation Patient Details Name: Miguel [name] MRN: [id number] DOB: 02/07/1951 Today's Date: 08/25/2015   History of Present Illness  Miguel [name] is a 65 y.o. male, History of CKD stage V, has left arm AV fistula placed over 8 months ago, running back pain has L4-L5 surgery done in the past by Dr. 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 Presented to ED after worsening RLE weakness and fall  Clinical Impression   Pt admitted with above diagnosis. Pt currently with functional limitations due to the deficits listed below (see PT Problem List).  Pt will benefit from skilled PT to increase their independence and safety with mobility to allow discharge to the venue listed below.    Miguel [name] will benefit from using an AFO for ankle and knee stability and dorsiflexion assist on the R; Have paged Dr. Broadus John re: getting an Orthotist Consult; with that consult, I believe we can contact the [facility] of his choice, and an Customer service manager can see him here in the hospital;   I plan to work with a prefabricated AFO for a trial next session    Follow Up Recommendations Home health PT    Equipment Recommendations  Rolling walker with 5" wheels;3in1 (PT)    Recommendations for Other Services       Precautions / Restrictions Precautions Precautions: Fall      Mobility  Bed Mobility Overal bed mobility: Modified Independent                Transfers Overall transfer level: Needs assistance Equipment used: Rolling walker (2 wheeled) Transfers: Sit to/from Stand Sit to Stand: Min assist         General transfer comment: heavily dependent on UEs to support and push self up on RW; cues for hand placement and safety; required min assist for steadying RW; difficulty with rise without UEs on  RW  Ambulation/Gait Ambulation/Gait assistance: Min guard Ambulation Distance (Feet): 60 Feet Assistive device: Rolling walker (2 wheeled) Gait Pattern/deviations: Steppage;Decreased stance time - right;Decreased dorsiflexion - right     General Gait Details: Noted steppage gait RLE, needing to step higher to clear toes due to foot drop; unable to heel strike; erratic step width R; Heavy reliance on UE support  Stairs            Wheelchair Mobility    Modified Rankin (Stroke Patients Only)       Balance Overall balance assessment: Needs assistance           Standing balance-Leahy Scale: Poor (approaching Fair)                               Pertinent Vitals/Pain Pain Assessment: No/denies pain    Home Living Family/patient expects to be discharged to:: Private residence Living Arrangements: Alone Available Help at Discharge: Available PRN/intermittently Type of Home: House Home Access: Stairs to enter Entrance Stairs-Rails: Left Entrance Stairs-Number of Steps: 3 Home Layout: One level Home Equipment: None      Prior Function Level of Independence: Independent;Independent with assistive device(s)         Comments: Tells me he uses Rw "on bad days"; Reports he falls apprx once a month     Hand Dominance        Extremity/Trunk Assessment   Upper Extremity Assessment: Overall WFL for tasks assessed  Lower Extremity Assessment: RLE deficits/detail RLE Deficits / Details: Overall weak RLE, greater weakness distally; hip flexion 3+/5, Quad 4/5, Hamstring (tested seated) 3+/5, Ankle dorsiflexion 0/4; Able to actively plantarflex    Cervical / Trunk Assessment: Normal  Communication   Communication: No difficulties  Cognition Arousal/Alertness: Awake/alert Behavior During Therapy: WFL for tasks assessed/performed Overall Cognitive Status: Within Functional Limits for tasks assessed                      General  Comments      Exercises        Assessment/Plan    PT Assessment Patient needs continued PT services  PT Diagnosis Abnormality of gait   PT Problem List Decreased strength;Decreased range of motion;Decreased activity tolerance;Decreased balance;Decreased mobility;Decreased knowledge of use of DME;Decreased safety awareness  PT Treatment Interventions DME instruction;Gait training;Stair training;Functional mobility training;Therapeutic activities;Therapeutic exercise;Balance training;Patient/family education;Other (comment) (Orthotic training)   PT Goals (Current goals can be found in the Care Plan section) Acute Rehab PT Goals Patient Stated Goal: did not state; seems to be open to getting an AFO PT Goal Formulation: With patient Time For Goal Achievement: 09/08/15 Potential to Achieve Goals: Good    Frequency Min 3X/week   Barriers to discharge        Co-evaluation               End of Session Equipment Utilized During Treatment: Gait belt Activity Tolerance: Patient tolerated treatment well Patient left: in chair;with call bell/phone within reach;with chair alarm set Nurse Communication: Mobility status         Time: [id number] PT Time Calculation (min) (ACUTE ONLY): 18 min   Charges:   PT Evaluation $PT Eval Moderate Complexity: 1 Procedure     PT G CodesRoney Marion Hamff 08/25/2015, 10:05 AM  Roney Marion, [place] Pager [phone] Office [phone]

## 2015-08-25 NOTE — Progress Notes (Signed)
PROGRESS NOTE    Miguel [name]  [id number] DOB: March 24, 1950 DOA: 08/24/2015 PCP: Velna Hatchet, MD  Brief Narrative: Miguel [name] is a 65 y.o. male, History of CKD stage V, has left arm AV fistula placed over 8 months ago, h/o back surgery, chronic right below-knee weakness with right foot drop for a few years, essential hypertension, paroxysmal atrial fibrillation, dyslipidemia, gout, diabetic peripheral neuropathy. Patient with above history with chronic right leg pain and foot drop comes to the hospital after experiencing worsening of his right leg weakness since 7/17 morning, he woke up and noticed that his right leg below-knee was slightly weaker than its baseline, he stood up and then later during the day he felt his right knee give out and he fell, he subsequently could not get up without assistance from EMS and was brought to the ER  In the ER workup showed CK D stage V with worsening and hyperkalemia. Admitted, started on Iv lasix and Renal consulted  Assessment & Plan:  1. Progressive CKD 5 with Hyperkalemia -mild fluid overload -Renal consulting, no change in K with kayexalate/lasix -No acute T-wave changes noted on EKG, Veltassa ordered this morning per renal, Ca gluconate too -On IV Lasix at this time, -Will likely need dialysis sooner rather than later, has mature AVF already   2. Gait disorder due to chronic foot drop, bilateral Osteoarthropathy, and Peripheral Neuropathy -appreciate Neuro input yesterday, no acute issues, signed off -PT eval completed, Orthotist consult ordered for AFO brace per Pt recs -TSH, B12 within normal limits  3.Essential hypertension - Continue Norvasc, clonidine and beta blocker, titrate down once HD started  4. Anemia  -likely due to CKD -stable, monitor  5. GERD. On PPI.  6. Dyslipidemia. - continue statin  7. BPH.  -continue Flomax   8. Mild early dementia.  -Continue Aricept.  DVT Prophylaxis Heparin   Full  Code Family Communication: None at bedside Dispo: home pending workup, decision regarding HD etc  Consultants:   Renal   Subjective: Has gait disorder, neuropathy, arthritis and foot drop, breathing ok  Objective: Filed Vitals:   08/24/15 1822 08/24/15 2146 08/25/15 0605 08/25/15 0937  BP: 158/86 132/68 115/72 101/57  Pulse: 57 66 62 60  Temp: 98 F (36.7 C) 98.2 F (36.8 C) 98.5 F (36.9 C) 97.7 F (36.5 C)  TempSrc: Oral Oral Oral Oral  Resp: 20 18 16 18   Height:      Weight:  145.1 kg (319 lb 14.2 oz)    SpO2: 93% 95% 94% 93%    Intake/Output Summary (Last 24 hours) at 08/25/15 1406 Last data filed at 08/25/15 1339  Gross per 24 hour  Intake    900 ml  Output   1275 ml  Net   -375 ml   Filed Weights   08/24/15 1159 08/24/15 2146  Weight: 145.151 kg (320 lb) 145.1 kg (319 lb 14.2 oz)    Examination:  General exam: Appears calm and comfortable, Obese, AAOx3 Respiratory system: Clear to auscultation. Respiratory effort normal. Cardiovascular system: S1 & S2 heard, RRR. No JVD, murmurs, rubs, gallops or clicks. No pedal edema. Gastrointestinal system: Abdomen is nondistended, soft and nontender. No organomegaly or masses felt. Normal bowel sounds heard. Central nervous system: Alert and oriented. No focal neurological deficits. Extremities: R Foot drop noted, arthropathy in both knees, 1plus edema. Skin: No rashes, lesions or ulcers Psychiatry: Judgement and insight appear normal. Mood & affect appropriate.     Data Reviewed: I have personally reviewed  following labs and imaging studies  CBC:  Recent Labs Lab 08/24/15 1332 08/24/15 2044 08/25/15 0502  WBC 6.6 8.2 7.4  NEUTROABS 4.4  --   --   HGB 11.2* 11.9* 10.7*  HCT 36.6* 38.1* 35.1*  MCV 95.3 94.5 94.1  PLT 226 243 [id number]   Basic Metabolic Panel:  Recent Labs Lab 08/24/15 1332 08/24/15 2044 08/25/15 0502  NA 141  --  140  K 5.9*  --  6.2*  CL 114*  --  111  CO2 18*  --  20*  GLUCOSE 88   --  90  BUN 84*  --  81*  CREATININE 8.03* 8.54* 8.38*  CALCIUM 8.5*  --  8.7*   GFR: Estimated Creatinine Clearance: 13.3 mL/min (by C-G formula based on Cr of 8.38). Liver Function Tests: No results for input(s): AST, ALT, ALKPHOS, BILITOT, PROT, ALBUMIN in the last 168 hours. No results for input(s): LIPASE, AMYLASE in the last 168 hours. No results for input(s): AMMONIA in the last 168 hours. Coagulation Profile:  Recent Labs Lab 08/24/15 2044  INR 1.08   Cardiac Enzymes: No results for input(s): CKTOTAL, CKMB, CKMBINDEX, TROPONINI in the last 168 hours. BNP (last 3 results) No results for input(s): PROBNP in the last 8760 hours. HbA1C: No results for input(s): HGBA1C in the last 72 hours. CBG: No results for input(s): GLUCAP in the last 168 hours. Lipid Profile: No results for input(s): CHOL, HDL, LDLCALC, TRIG, CHOLHDL, LDLDIRECT in the last 72 hours. Thyroid Function Tests:  Recent Labs  08/24/15 2044  TSH 1.883   Anemia Panel:  Recent Labs  08/24/15 2044  VITAMINB12 574   Urine analysis:    Component Value Date/Time   COLORURINE YELLOW 06/22/2007 1623   APPEARANCEUR CLEAR 06/22/2007 1623   LABSPEC 1.017 06/22/2007 1623   PHURINE 5.0 06/22/2007 1623   GLUCOSEU NEGATIVE 06/22/2007 1623   HGBUR NEGATIVE 06/22/2007 1623   BILIRUBINUR SMALL* 06/22/2007 1623   KETONESUR 15* 06/22/2007 1623   PROTEINUR 100* 06/22/2007 1623   UROBILINOGEN 0.2 06/22/2007 1623   NITRITE NEGATIVE 06/22/2007 1623   LEUKOCYTESUR NEGATIVE 06/22/2007 1623   Sepsis Labs: @LABRCNTIP (procalcitonin:4,lacticidven:4)  )No results found for this or any previous visit (from the past 240 hour(s)).       Radiology Studies: Dg Chest 2 View  08/24/2015  CLINICAL DATA:  Status post fall at home, persistent unsteady gait, history of diabetes, chronic renal insufficiency, atrial fibrillation. EXAM: CHEST  2 VIEW COMPARISON:  PA and lateral chest x-ray of March 18, 2013 FINDINGS: The  lungs are adequately inflated and clear. The heart is enlarged. The pulmonary vascularity is engorged centrally. There is tortuosity of the ascending and descending thoracic aorta. There is no pleural effusion. The trachea is midline. The bony thorax exhibits no acute abnormality. IMPRESSION: CHF manifested back cardiac enlargement and mild central pulmonary vascular congestion. No significant interstitial or alveolar edema. No pneumonia. Electronically Signed   By: David  Martinique M.D.   On: 08/24/2015 13:07   Dg Tibia/fibula Right  08/24/2015  CLINICAL DATA:  Fall.  Unsteady gait. EXAM: RIGHT TIBIA AND FIBULA - 2 VIEW COMPARISON:  None. FINDINGS: There is no evidence of fracture or other focal bone lesions. Soft tissues are unremarkable. IMPRESSION: Normal right tibia and fibula. Electronically Signed   By: Marijo Conception, M.D.   On: 08/24/2015 13:09   Ct Head Wo Contrast  08/24/2015  CLINICAL DATA:  Bilateral lower extremity weakness. Intermittent dizziness EXAM: CT HEAD WITHOUT CONTRAST TECHNIQUE:  Contiguous axial images were obtained from the base of the skull through the vertex without intravenous contrast. COMPARISON:  Brain MRI April 01, 2013 FINDINGS: Brain: The ventricles are normal in size and configuration. There is no intracranial mass, hemorrhage, extra-axial fluid collection, or midline shift. Gray-white compartments appear within normal limits. No acute infarct is evident. Vascular: There is calcification in the distal left vertebral artery. There is calcification in the carotid siphon regions/cavernous carotid artery regions bilaterally. No hyper dense vessels are identified. Skull: The bony calvarium appears intact. Sinuses/Orbits: Orbits appear symmetric bilaterally. There is a small benign osteoma in the left anterior ethmoid air cell complex. There is a tiny retention cyst in the posterior right maxillary antrum. Other paranasal sinuses which are visualized are clear. Other: Mastoid air  cells are clear. IMPRESSION: No intracranial mass, hemorrhage, or focal gray -white compartment lesion. There are foci of arterial vascular calcification. There is rather minimal paranasal sinus disease. Electronically Signed   By: Lowella Grip III M.D.   On: 08/24/2015 15:28   US Renal  08/24/2015  CLINICAL DATA:  Acute kidney injury. EXAM: RENAL / URINARY TRACT ULTRASOUND COMPLETE COMPARISON:  04/28/2010. FINDINGS: Right Kidney: Length: . Echogenicity within normal limits. No mass or hydronephrosis visualized. Left Kidney: Length: . Echogenicity within normal limits. No mass or hydronephrosis visualized. Bladder: Appears normal for degree of bladder distention. Electronically Signed   By: Marcello Moores  Register   On: 08/24/2015 16:01   Dg Knee Complete 4 Views Right  08/24/2015  CLINICAL DATA:  65 year old male with history of weakness and fall onto the knees complaining of right-sided knee pain. Unsteady gait. EXAM: RIGHT KNEE - COMPLETE 4+ VIEW COMPARISON:  No priors. FINDINGS: No acute displaced fracture, subluxation or dislocation. Suprapatellar effusion. Joint space narrowing, subchondral sclerosis, subchondral cyst formation and osteophyte formation, compatible with moderate tricompartmental osteoarthritis. On the frontal projection there is a possible loose body in the joint space in the region of the intercondylar notch. IMPRESSION: 1. Suprapatellar effusion. 2. Moderate tricompartmental osteoarthritis. 3. Possible loose body in the joint space visualized on only one projection. If there is clinical concern for loose body in the joint, this could be better evaluated with MRI of the knee if clinically appropriate. Electronically Signed   By: Vinnie Langton M.D.   On: 08/24/2015 13:12   Dg Hip Unilat With Pelvis 2-3 Views Right  08/24/2015  CLINICAL DATA:  Unsteadiness of the knees with instability and subsequent fall, unsteady gait. EXAM: DG HIP (WITH OR WITHOUT PELVIS) 2-3V RIGHT COMPARISON:  KUB  of February 21, 2006 FINDINGS: The bones are subjectively osteopenic. The sacrum and iliac bones are intact. The pubic rami are intact. AP and lateral views of the right hip reveals no acute fracture nor dislocation. There is mild stable joint space loss superiorly. The femoral neck, intertrochanteric, and subtrochanteric regions are normal. IMPRESSION: Mild degenerative narrowing of the right hip joint. No acute fracture nor dislocation. Electronically Signed   By: David  Martinique M.D.   On: 08/24/2015 13:09        Scheduled Meds: . amLODipine  10 mg Oral Daily  . calcitRIOL  0.5 mcg Oral QODAY   And  . [START ON 08/26/2015] calcitRIOL  0.25 mcg Oral QODAY  . cloNIDine  0.1 mg Oral BID  . donepezil  10 mg Oral QHS  . furosemide  80 mg Intravenous TID  . heparin  5,000 Units Subcutaneous Q8H  . metoprolol succinate  100 mg Oral BID  . pantoprazole  40 mg Oral Daily  . patiromer  8.4 g Oral Daily  . rosuvastatin  40 mg Oral q1800  . sodium bicarbonate  50 mEq Intravenous Once  . sodium bicarbonate  650 mg Oral TID  . sodium chloride flush  3 mL Intravenous Q12H  . tamsulosin  0.4 mg Oral QHS   Continuous Infusions:    LOS: 1 day    Time spent: 72min    Domenic Polite, MD [facility] Pager [phone]  If 7PM-7AM, please contact night-coverage [email or web address] Password [email or web address] 08/25/2015, 2:06 PM

## 2015-08-25 NOTE — Care Management CC44 (Signed)
Condition Code 44 Documentation Completed  Patient Details  Name: SOLACE GALETTI MRN: [id number] Date of Birth: 1950/07/07   Condition Code 44 given:   yes Patient signature on Condition Code 44 notice:   yes Documentation of 2 MD's agreement:   yes Code 44 added to claim:   yes    Adron Bene, RN 08/25/2015, 3:40 PM

## 2015-08-26 DIAGNOSIS — N185 Chronic kidney disease, stage 5: Secondary | ICD-10-CM

## 2015-08-26 DIAGNOSIS — N179 Acute kidney failure, unspecified: Secondary | ICD-10-CM | POA: Diagnosis not present

## 2015-08-26 DIAGNOSIS — I120 Hypertensive chronic kidney disease with stage 5 chronic kidney disease or end stage renal disease: Secondary | ICD-10-CM

## 2015-08-26 DIAGNOSIS — R531 Weakness: Secondary | ICD-10-CM | POA: Diagnosis not present

## 2015-08-26 DIAGNOSIS — E875 Hyperkalemia: Secondary | ICD-10-CM | POA: Diagnosis not present

## 2015-08-26 LAB — CBC
HCT: 32.1 % — ABNORMAL LOW (ref 39.0–52.0)
HEMOGLOBIN: 10 g/dL — AB (ref 13.0–17.0)
MCH: 29 pg (ref 26.0–34.0)
MCHC: 31.2 g/dL (ref 30.0–36.0)
MCV: 93 fL (ref 78.0–100.0)
Platelets: 203 10*3/uL (ref 150–400)
RBC: 3.45 MIL/uL — ABNORMAL LOW (ref 4.22–5.81)
RDW: 14.3 % (ref 11.5–15.5)
WBC: 6.2 10*3/uL (ref 4.0–10.5)

## 2015-08-26 LAB — FOLATE RBC
Folate, Hemolysate: 430.6 ng/mL
Folate, RBC: 1186 ng/mL (ref >498)
Hematocrit: 36.3 % — ABNORMAL LOW (ref 37.5–51.0)

## 2015-08-26 LAB — BASIC METABOLIC PANEL
ANION GAP: 11 (ref 5–15)
BUN: 77 mg/dL — AB (ref 6–20)
CALCIUM: 9.2 mg/dL (ref 8.9–10.3)
CO2: 19 mmol/L — ABNORMAL LOW (ref 22–32)
Chloride: 109 mmol/L (ref 101–111)
Creatinine, Ser: 8.63 mg/dL — ABNORMAL HIGH (ref 0.61–1.24)
GFR, EST AFRICAN AMERICAN: 7 mL/min — AB (ref >60)
GFR, EST NON AFRICAN AMERICAN: 6 mL/min — AB (ref >60)
GLUCOSE: 112 mg/dL — AB (ref 65–99)
POTASSIUM: 4.7 mmol/L (ref 3.5–5.1)
SODIUM: 139 mmol/L (ref 135–145)

## 2015-08-26 LAB — HEMOGLOBIN A1C
Hgb A1c MFr Bld: 5.7 % — ABNORMAL HIGH (ref 4.8–5.6)
Hgb A1c MFr Bld: 5.7 % — ABNORMAL HIGH (ref 4.8–5.6)
MEAN PLASMA GLUCOSE: 117 mg/dL
Mean Plasma Glucose: 117 mg/dL

## 2015-08-26 LAB — PHOSPHORUS: PHOSPHORUS: 8.7 mg/dL — AB (ref 2.5–4.6)

## 2015-08-26 MED ORDER — SENNOSIDES-DOCUSATE SODIUM 8.6-50 MG PO TABS
1.0000 | ORAL_TABLET | Freq: Two times a day (BID) | ORAL | Status: DC
  Administered 2015-08-26: 1 via ORAL
  Filled 2015-08-26: qty 1

## 2015-08-26 MED ORDER — SEVELAMER CARBONATE 800 MG PO TABS: 1600.0000 mg | ORAL_TABLET | Freq: Three times a day (TID) | ORAL | Status: AC

## 2015-08-26 MED ORDER — FUROSEMIDE 80 MG PO TABS: 160.0000 mg | ORAL_TABLET | Freq: Every day | ORAL | Status: DC

## 2015-08-26 MED ORDER — SEVELAMER CARBONATE 800 MG PO TABS
1600.0000 mg | ORAL_TABLET | Freq: Three times a day (TID) | ORAL | Status: DC
  Administered 2015-08-26 (×2): 1600 mg via ORAL
  Filled 2015-08-26 (×2): qty 2

## 2015-08-26 MED ORDER — PATIROMER SORBITEX CALCIUM 8.4 G PO PACK: 8.4000 g | PACK | Freq: Every day | ORAL | Status: AC

## 2015-08-26 MED ORDER — FUROSEMIDE 80 MG PO TABS: 160.0000 mg | ORAL_TABLET | Freq: Every evening | ORAL | Status: AC

## 2015-08-26 MED ORDER — FUROSEMIDE 80 MG PO TABS: 80.0000 mg | ORAL_TABLET | ORAL | Status: AC

## 2015-08-26 MED ORDER — POLYETHYLENE GLYCOL 3350 17 G PO PACK
17.0000 g | PACK | Freq: Two times a day (BID) | ORAL | Status: DC
  Administered 2015-08-26: 17 g via ORAL
  Filled 2015-08-26: qty 1

## 2015-08-26 MED ORDER — FUROSEMIDE 80 MG PO TABS
80.0000 mg | ORAL_TABLET | ORAL | Status: DC
  Administered 2015-08-26: 80 mg via ORAL
  Filled 2015-08-26: qty 1

## 2015-08-26 MED ORDER — SEVELAMER CARBONATE 800 MG PO TABS: 1600.0000 mg | ORAL_TABLET | Freq: Three times a day (TID) | ORAL | Status: DC

## 2015-08-26 NOTE — Discharge Summary (Signed)
Physician Discharge Summary  Miguel [name] Jocelyn [id number] DOB: [birth year] DOA: 08/24/2015  PCP: Velna Hatchet, MD  Admit date: 08/24/2015 Discharge date: 08/26/2015  Recommendations for Outpatient Follow-up:  1. Pt will need to follow up with PCP in 2-3 weeks post discharge 2. Please obtain BMP to evaluate electrolytes and kidney function 3. Please also check CBC to evaluate Hg and Hct levels  Discharge Diagnoses:  Active Problems:   Paroxysmal atrial fibrillation (HCC)   CKD stage 5 secondary to hypertension (HCC)   Essential hypertension   Dyslipidemia   Right foot drop   ESRD (end stage renal disease) ([place])  Discharge Condition: Stable  Diet recommendation: Heart healthy diet discussed in details   Brief Narrative:  65 y.o. male, History of CKD stage V, has left arm AV fistula placed over 8 months ago, h/o back surgery, chronic right below-knee weakness with right foot drop for a few years, essential hypertension, paroxysmal atrial fibrillation, dyslipidemia, gout, diabetic peripheral neuropathy, comes to the hospital after experiencing worsening of his right leg weakness since 7/17 morning, he woke up and noticed that his right leg below-knee was slightly weaker than its baseline, he stood up and then later during the day he felt his right knee give out and he fell, he subsequently could not get up without assistance from EMS and was brought to the ER.  Assessment & Plan: 1. Progressive CKD 5 with Hyperkalemia - denies any symptoms currently. K improved today, 6.2>5.0>4.7 - after starting the Veltassa yesterday. His renal function is at his OP baseline. - does not wish to have home health PT at discharge - per primary nephrologist- he is okay with him not starting dialysis right at this time -pt does not want to take kayexalate due to side effects   2. Gait disorder due to chronic foot drop, bilateral Osteoarthropathy, and Peripheral Neuropathy -appreciate Neuro input  yesterday, no acute issues, signed off -PT eval completed, Orthotist consult ordered for AFO brace per Pt recs -patient declined [facility] PT or OT  3.Essential hypertension - Continue Norvasc, clonidine and beta blocker  4. Anemia  -likely due to CKD -stable, monitor  5. GERD. On PPI.  6. Dyslipidemia. - continue statin  7. BPH.  -continue Flomax   8. Mild early dementia.  -Continue Aricept.   Full Code Family Communication: None at bedside Dispo: home   Consultants:   Renal       Procedures/Studies: Dg Chest 2 View  08/24/2015  CLINICAL DATA:  Status post fall at home, persistent unsteady gait, history of diabetes, chronic renal insufficiency, atrial fibrillation. EXAM: CHEST  2 VIEW COMPARISON:  PA and lateral chest x-ray of March 18, 2013 FINDINGS: The lungs are adequately inflated and clear. The heart is enlarged. The pulmonary vascularity is engorged centrally. There is tortuosity of the ascending and descending thoracic aorta. There is no pleural effusion. The trachea is midline. The bony thorax exhibits no acute abnormality. IMPRESSION: CHF manifested back cardiac enlargement and mild central pulmonary vascular congestion. No significant interstitial or alveolar edema. No pneumonia. Electronically Signed   By: David  Martinique M.D.   On: 08/24/2015 13:07   Dg Tibia/fibula Right  08/24/2015  CLINICAL DATA:  Fall.  Unsteady gait. EXAM: RIGHT TIBIA AND FIBULA - 2 VIEW COMPARISON:  None. FINDINGS: There is no evidence of fracture or other focal bone lesions. Soft tissues are unremarkable. IMPRESSION: Normal right tibia and fibula. Electronically Signed   By: Marijo Conception, M.D.   On: 08/24/2015 13:09  Ct Head Wo Contrast  08/24/2015  CLINICAL DATA:  Bilateral lower extremity weakness. Intermittent dizziness EXAM: CT HEAD WITHOUT CONTRAST TECHNIQUE: Contiguous axial images were obtained from the base of the skull through the vertex without intravenous contrast.  COMPARISON:  Brain MRI April 01, 2013 FINDINGS: Brain: The ventricles are normal in size and configuration. There is no intracranial mass, hemorrhage, extra-axial fluid collection, or midline shift. Gray-white compartments appear within normal limits. No acute infarct is evident. Vascular: There is calcification in the distal left vertebral artery. There is calcification in the carotid siphon regions/cavernous carotid artery regions bilaterally. No hyper dense vessels are identified. Skull: The bony calvarium appears intact. Sinuses/Orbits: Orbits appear symmetric bilaterally. There is a small benign osteoma in the left anterior ethmoid air cell complex. There is a tiny retention cyst in the posterior right maxillary antrum. Other paranasal sinuses which are visualized are clear. Other: Mastoid air cells are clear. IMPRESSION: No intracranial mass, hemorrhage, or focal gray -white compartment lesion. There are foci of arterial vascular calcification. There is rather minimal paranasal sinus disease. Electronically Signed   By: Lowella Grip III M.D.   On: 08/24/2015 15:28   US Renal  08/24/2015  CLINICAL DATA:  Acute kidney injury. EXAM: RENAL / URINARY TRACT ULTRASOUND COMPLETE COMPARISON:  04/28/2010. FINDINGS: Right Kidney: Length: . Echogenicity within normal limits. No mass or hydronephrosis visualized. Left Kidney: Length: . Echogenicity within normal limits. No mass or hydronephrosis visualized. Bladder: Appears normal for degree of bladder distention. Electronically Signed   By: Marcello Moores  Register   On: 08/24/2015 16:01   Dg Knee Complete 4 Views Right  08/24/2015  CLINICAL DATA:  65 year old male with history of weakness and fall onto the knees complaining of right-sided knee pain. Unsteady gait. EXAM: RIGHT KNEE - COMPLETE 4+ VIEW COMPARISON:  No priors. FINDINGS: No acute displaced fracture, subluxation or dislocation. Suprapatellar effusion. Joint space narrowing, subchondral sclerosis,  subchondral cyst formation and osteophyte formation, compatible with moderate tricompartmental osteoarthritis. On the frontal projection there is a possible loose body in the joint space in the region of the intercondylar notch. IMPRESSION: 1. Suprapatellar effusion. 2. Moderate tricompartmental osteoarthritis. 3. Possible loose body in the joint space visualized on only one projection. If there is clinical concern for loose body in the joint, this could be better evaluated with MRI of the knee if clinically appropriate. Electronically Signed   By: Vinnie Langton M.D.   On: 08/24/2015 13:12   Dg Hip Unilat With Pelvis 2-3 Views Right  08/24/2015  CLINICAL DATA:  Unsteadiness of the knees with instability and subsequent fall, unsteady gait. EXAM: DG HIP (WITH OR WITHOUT PELVIS) 2-3V RIGHT COMPARISON:  KUB of February 21, 2006 FINDINGS: The bones are subjectively osteopenic. The sacrum and iliac bones are intact. The pubic rami are intact. AP and lateral views of the right hip reveals no acute fracture nor dislocation. There is mild stable joint space loss superiorly. The femoral neck, intertrochanteric, and subtrochanteric regions are normal. IMPRESSION: Mild degenerative narrowing of the right hip joint. No acute fracture nor dislocation. Electronically Signed   By: David  Martinique M.D.   On: 08/24/2015 13:09    Discharge Exam: Filed Vitals:   08/26/15 0625 08/26/15 0945  BP: 127/71 126/90  Pulse: 60 67  Temp: 98.4 F (36.9 C) 97.9 F (36.6 C)  Resp: 20 20   Filed Vitals:   08/25/15 1755 08/25/15 2028 08/26/15 0625 08/26/15 0945  BP: 146/73 140/75 127/71 126/90  Pulse: 64 62 60  67  Temp: 98 F (36.7 C) 97.8 F (36.6 C) 98.4 F (36.9 C) 97.9 F (36.6 C)  TempSrc: Oral Oral Oral Oral  Resp: 19 20 20 20   Height:      Weight:   64.501 kg (142 lb 3.2 oz)   SpO2: 95% 97% 95% 96%    General: Pt is alert, follows commands appropriately, not in acute distress Cardiovascular: Regular rate and  rhythm, S1/S2 +, no murmurs, no rubs, no gallops Respiratory: Clear to auscultation bilaterally, no wheezing, no crackles, no rhonchi Abdominal: Soft, non tender, non distended, bowel sounds +, no guarding   Discharge Instructions     Medication List    STOP taking these medications        cinacalcet 30 MG tablet  Commonly known as:  SENSIPAR     KIONEX 15 GM/60ML suspension  Generic drug:  sodium polystyrene     traMADol 50 MG tablet  Commonly known as:  ULTRAM      TAKE these medications        allopurinol 100 MG tablet  Commonly known as:  ZYLOPRIM  Take 200 mg by mouth daily.     amLODipine 10 MG tablet  Commonly known as:  NORVASC  Take 10 mg by mouth daily.     calcitRIOL 0.25 MCG capsule  Commonly known as:  ROCALTROL  Take 0.25-0.5 mcg by mouth daily. Takes 0.33mcg on even days, and 0.16mcg on odd days     furosemide 80 MG tablet  Commonly known as:  LASIX  Take 1 tablet (80 mg total) by mouth daily.     furosemide 80 MG tablet  Commonly known as:  LASIX  Take 2 tablets (160 mg total) by mouth every evening.     gabapentin 100 MG capsule  Commonly known as:  NEURONTIN  Take 100 mg by mouth 2 (two) times daily.     metoprolol succinate 100 MG 24 hr tablet  Commonly known as:  TOPROL-XL  Take 100 mg by mouth 2 (two) times daily. Take with or immediately following a meal.     NITROSTAT 0.4 MG SL tablet  Generic drug:  nitroGLYCERIN  Place 0.4 mg under the tongue every 5 (five) minutes as needed for chest pain.     patiromer 8.4 g packet  Commonly known as:  VELTASSA  Take 1 packet (8.4 g total) by mouth daily.     CRESTOR 40 MG tablet  Generic drug:  rosuvastatin  Take 40 mg by mouth daily.     rosuvastatin 20 MG tablet  Commonly known as:  CRESTOR  Take 20 mg by mouth at bedtime.     sevelamer carbonate 800 MG tablet  Commonly known as:  RENVELA  Take 2 tablets (1,600 mg total) by mouth 3 (three) times daily with meals.     sodium  bicarbonate 650 MG tablet  Take 650 mg by mouth 3 (three) times daily.     tamsulosin 0.4 MG Caps capsule  Commonly known as:  FLOMAX  Take 0.4 mg by mouth at bedtime.            Follow-up Information    Follow up with Velna Hatchet, MD.   Specialty:  Internal Medicine   Contact information:   [address] [place] [address] [phone]        The results of significant diagnostics from this hospitalization (including imaging, microbiology, ancillary and laboratory) are listed below for reference.     Microbiology: No results  found for this or any previous visit (from the past 240 hour(s)).   Labs: Basic Metabolic Panel:  Recent Labs Lab 08/24/15 1332 08/24/15 2044 08/25/15 0502 08/25/15 1458 08/26/15 0558  NA 141  --  140 140 139  K 5.9*  --  6.2* 5.0 4.7  CL 114*  --  111 111 109  CO2 18*  --  20* 20* 19*  GLUCOSE 88  --  90 114* 112*  BUN 84*  --  81* 78* 77*  CREATININE 8.03* 8.54* 8.38* 8.49* 8.63*  CALCIUM 8.5*  --  8.7* 9.4 9.2  PHOS  --   --   --  8.2* 8.7*   Liver Function Tests:  Recent Labs Lab 08/25/15 1458  ALBUMIN 3.2*   CBC:  Recent Labs Lab 08/24/15 1332 08/24/15 2044 08/25/15 0502 08/26/15 0558  WBC 6.6 8.2 7.4 6.2  NEUTROABS 4.4  --   --   --   HGB 11.2* 11.9* 10.7* 10.0*  HCT 36.6* 38.1* 35.1* 32.1*  MCV 95.3 94.5 94.1 93.0  PLT 226 243 223 203     SIGNED: Time coordinating discharge: 30 minutes  MAGICK-Mekiyah Gladwell, MD  [facility] 08/26/2015, 1:58 PM Pager [phone] If 7PM-7AM, please contact night-coverage [email or web address] Password [email or web address]

## 2015-08-26 NOTE — Progress Notes (Signed)
Pt given discharge instructions and hard prescriptions.  Pt verbalizes understanding of all meds and when to take them and understands diet. Information given to pt about diet. All belongings with pt. Pt is aware of brace and how to use it and when it will be delivered. Awaiting transport via his brother.

## 2015-08-26 NOTE — Progress Notes (Signed)
Physical Therapy Note  (Late entry for G Code correction)    08-28-2015 1100  PT G-Codes **NOT FOR INPATIENT CLASS**  Functional Assessment Tool Used Clinical Judgement  Functional Limitation Mobility: Walking and moving around  Mobility: Walking and Moving Around Current Status [id number]) CI  Mobility: Walking and Moving Around Goal Status [phone]) [place], PT  Acute Rehabilitation Services Pager [phone] Office [phone]

## 2015-08-26 NOTE — Progress Notes (Signed)
Subjective:  Patient with no complaints this morning. Denies any appetite loss. No uremic symptoms. Reports no problems with PT yesterday. Admits to not using his walker very often at home. Hesitant about having home PT work with him after discharge, says he can do it on his own.   Objective Vital signs in last 24 hours: Filed Vitals:   08/25/15 1755 08/25/15 2028 08/26/15 0625 08/26/15 0945  BP: 146/73 140/75 127/71 126/90  Pulse: 64 62 60 67  Temp: 98 F (36.7 C) 97.8 F (36.6 C) 98.4 F (36.9 C) 97.9 F (36.6 C)  TempSrc: Oral Oral Oral Oral  Resp: 19 20 20 20   Height:      Weight:   64.501 kg (142 lb 3.2 oz)   SpO2: 95% 97% 95% 96%    Intake/Output Summary (Last 24 hours) at 08/26/15 1112 Last data filed at 08/26/15 0900  Gross per 24 hour  Intake   1190 ml  Output   2850 ml  Net  -1660 ml   Assessment/ Plan:  1. CKD Stage V with hyperkalemia - Patient denies any symptoms currently. K improved today, 6.2>5.0>4.7, after starting the Veltassa yesterday. His renal function is at his OP baseline. Ca is 9.2 (corrected 9.8) and phos is 8.7. Will start Renvela 1600 mg tid. Continue Veltassa and d/c home kayexalate. With his K improved, no need to initiate dialysis immediately. He does not wish to have home health PT at discharge. Discussed if he continues to have issues with weakness and has to come back to the hospital we will need to start dialysis at that time.  Okay for discharge from renal standpoint. Did talk to his primary nephrologist- he is okay with him not starting dialysis right at this time. He would like him to continue with the Kayexalate as suspects compliance is the issue  2. Hypertension - BP soft this morning. Currently on amlodipine 10 mg daily, Clonidine 0.1 mg bid and metoprolol 100 mg bid as well as lasix 80 TID. Suspect he may not have been compliant - we'll discharge on 160 in the morning and 80 in the evening  3. Anemia - Hgb stable, 10.0 today. Continue to  monitor.   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 Will start Renvela 1600 mg tid. Was supposed to be on calcitriol and Sensipar as an outpatient  4. Vascular Access - Mature LUE AVF  5. Deconditioning- will benefit from home health PT   Medications:  Scheduled Medications: . amLODipine  10 mg Oral Daily  . calcitRIOL  0.5 mcg Oral QODAY   And  . calcitRIOL  0.25 mcg Oral QODAY  . cloNIDine  0.1 mg Oral BID  . donepezil  10 mg Oral QHS  . furosemide  80 mg Intravenous TID  . heparin  5,000 Units Subcutaneous Q8H  . metoprolol succinate  100 mg Oral BID  . pantoprazole  40 mg Oral Daily  . patiromer  8.4 g Oral Daily  . polyethylene glycol  17 g Oral BID  . rosuvastatin  40 mg Oral q1800  . senna-docusate  1 tablet Oral BID  . sevelamer carbonate  1,600 mg Oral TID WC  . sodium bicarbonate  50 mEq Intravenous Once  . sodium bicarbonate  650 mg Oral TID  . sodium chloride flush  3 mL Intravenous Q12H  . tamsulosin  0.4 mg Oral QHS    I have reviewed scheduled and prn medications.  Physical Exam: GENERAL- alert,  co-operative, appears as stated age, not in any distress. HEENT- Atraumatic, normocephalic, PERRL, EOMI, oral mucosa appears moist CARDIAC- RRR, no murmurs, rubs or gallops. RESP- Moving equal volumes of air, and clear to auscultation bilaterally, no wheezes or crackles. ABDOMEN- Soft, nontender, bowel sounds present. NEURO- No obvious Cr N abnormality. EXTREMITIES- pulse 2+, symmetric, 1+ pedal edema. SKIN- Warm, dry, No rash or lesion. PSYCH- Normal mood and affect, appropriate thought content and speech.  Maryellen Pile IMTS PGY-2 [phone]  Patient seen and examined, agree with above note with above modifications. Patient does not seem to have overwhelming indications to initiate dialysis at this time. I have discussed this with his primary nephrologist. Upon discharge patient will be placed on Lasix 160 in the morning and 80 in the afternoon  as well as to resume Kayexalate. I have urged patient to be more compliant with medications as this may help delay further his need for dialysis. Patient also understands however that if he continues to fail to thrive that that would be our indication to start. Corliss Parish, MD 08/26/2015

## 2015-08-26 NOTE — Discharge Instructions (Signed)
Acute Kidney Injury °Acute kidney injury is any condition in which there is sudden (acute) damage to the kidneys. Acute kidney injury was previously known as acute kidney failure or acute renal failure. The kidneys are two organs that lie on either side of the spine between the middle of the back and the front of the abdomen. The kidneys: °· Remove wastes and extra water from the blood.   °· Produce important hormones. These help keep bones strong, regulate blood pressure, and help create red blood cells.   °· Balance the fluids and chemicals in the blood and tissues. °A small amount of kidney damage may not cause problems, but a large amount of damage may make it difficult or impossible for the kidneys to work the way they should. Acute kidney injury may develop into long-lasting (chronic) kidney disease. It may also develop into a life-threatening disease called end-stage kidney disease. Acute kidney injury can get worse very quickly, so it should be treated right away. Early treatment may prevent other kidney diseases from developing. °CAUSES  °· A problem with blood flow to the kidneys. This may be caused by:   °¨ Blood loss.   °¨ Heart disease.   °¨ Severe burns.   °¨ Liver disease. °· Direct damage to the kidneys. This may be caused by: °¨ Some medicines.   °¨ A kidney infection.   °¨ Poisoning or consuming toxic substances.   °¨ A surgical wound.   °¨ A blow to the kidney area.   °· A problem with urine flow. This may be caused by:   °¨ Cancer.   °¨ Kidney stones.   °¨ An enlarged prostate. °SIGNS AND SYMPTOMS  °· Swelling (edema) of the legs, ankles, or feet.   °· Tiredness (lethargy).   °· Nausea or vomiting.   °· Confusion.   °· Problems with urination, such as:   °¨ Painful or burning feeling during urination.   °¨ Decreased urine production.   °¨ Frequent accidents in children who are potty trained.   °¨ Bloody urine.   °· Muscle twitches and cramps.   °· Shortness of breath.   °· Seizures.   °· Chest  pain or pressure. °Sometimes, no symptoms are present.  °DIAGNOSIS °Acute kidney injury may be detected and diagnosed by tests, including blood, urine, imaging, or kidney biopsy tests.  °TREATMENT °Treatment of acute kidney injury varies depending on the cause and severity of the kidney damage. In mild cases, no treatment may be needed. The kidneys may heal on their own. If acute kidney injury is more severe, your health care provider will treat the cause of the kidney damage, help the kidneys heal, and prevent complications from occurring. Severe cases may require a procedure to remove toxic wastes from the body (dialysis) or surgery to repair kidney damage. Surgery may involve:  °· Repair of a torn kidney.   °· Removal of an obstruction. °HOME CARE INSTRUCTIONS °· Follow your prescribed diet. °· Take medicines only as directed by your health care provider.  °· Do not take any new medicines (prescription, over-the-counter, or nutritional supplements) unless approved by your health care provider. Many medicines can worsen your kidney damage or may need to have the dose adjusted.   °· Keep all follow-up visits as directed by your health care provider. This is important. °· Observe your condition to make sure you are healing as expected. °SEEK IMMEDIATE MEDICAL CARE IF: °· You are feeling ill or have severe pain in the back or side.   °· Your symptoms return or you have new symptoms. °· You have any symptoms of end-stage kidney disease. These include:   °¨ Persistent itchiness.   °¨   Loss of appetite.   °¨ Headaches.   °¨ Abnormally dark or light skin. °¨ Numbness in the hands or feet.   °¨ Easy bruising.   °¨ Frequent hiccups.   °¨ Menstruation stops.   °· You have a fever. °· You have increased urine production. °· You have pain or bleeding when urinating. °MAKE SURE YOU:  °· Understand these instructions. °· Will watch your condition. °· Will get help right away if you are not doing well or get worse. °  °This  information is not intended to replace advice given to you by your health care provider. Make sure you discuss any questions you have with your health care provider. °  °Document Released: 08/09/2010 Document Revised: 02/14/2014 Document Reviewed: 09/23/2011 °Elsevier Interactive Patient Education ©2016 [employer] Inc[employer] ° °

## 2015-08-26 NOTE — Care Management Note (Signed)
Case Management Note  Patient Details  Name: Miguel [name] MRN: [id number] Date of Birth: [birth year]  Subjective/Objective:         CM following for progression and d/c planning.            Action/Plan: 08/26/2015 Pt request for wheelchair discussed with PT Talbert Nan,  Who states that with the brace as recommended this pt should not require a wheelchair. Additionally this pt need to continue to walk and increase mobility. Will continue to follow and arrange [place] as ordered.   Expected Discharge Date:   (unknown)               Expected Discharge Plan:  [place]  In-House Referral:  NA  Discharge planning Services  CM Consult  Post Acute Care Choice:  Durable Medical Equipment, Home Health Choice offered to:     DME Arranged:    DME Agency:     HH Arranged:    [place] Agency:     Status of Service:  In process, will continue to follow  If discussed at Long Length of Stay Meetings, dates discussed:    Additional Comments:  Adron Bene, RN 08/26/2015, 4:36 PM

## 2015-08-26 NOTE — Progress Notes (Signed)
Physical Therapy Treatment Patient Details Name: Miguel [name] MRN: [id number] DOB: [birth year] Today's Date: 08/26/2015    History of Present Illness Miguel [name] is a 65 y.o. 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 Presented to ED after worsening RLE weakness and fall    PT Comments    Walking trial with prefabricated AFO a success; Miguel [name] and Miguel [name], Orthotists from [employer] [phone]) came in to meet with pt and they recommend the [facility] AFO, which will help with dorsiflexion assist, and ankle and knee control; They are checking in to see if they can provide Miguel [name] with one while here in hospital maybe today or tomorrow; if he discharges before one can be delivered, he can get it from their office.   Follow Up Recommendations  Home health PT     Equipment Recommendations  Rolling walker with 5" wheels;3in1 (PT)    Recommendations for Other Services       Precautions / Restrictions Precautions Precautions: Fall    Mobility  Bed Mobility Overal bed mobility: Modified Independent                Transfers Overall transfer level: Needs assistance Equipment used: Rolling walker (2 wheeled) Transfers: Sit to/from Stand Sit to Stand: Supervision         General transfer comment:  cues for correct hand placement and safety; much improved rise  Ambulation/Gait Ambulation/Gait assistance: Supervision Ambulation Distance (Feet): 80 Feet Assistive device: Rolling walker (2 wheeled) Gait Pattern/deviations: Step-through pattern     General Gait Details: Improved gait pattern with dorsiflexion assist AFO; pt seemed plased with AFO trial and [employer] came in to assess for best  AFO type   Stairs            Wheelchair Mobility    Modified Rankin (Stroke Patients Only)       Balance Overall balance assessment: Needs assistance Sitting-balance support: No upper extremity supported;Feet supported Sitting balance-Leahy Scale: Good     Standing balance support: During functional activity Standing balance-Leahy Scale: Fair Standing balance comment: pt stood at sink to wash hands,face and to initiate oral care. Completed oral care seated in front of sink                    Cognition Arousal/Alertness: Awake/alert Behavior During Therapy: WFL for tasks assessed/performed Overall Cognitive Status: Within Functional Limits for tasks assessed                      Exercises      General Comments        Pertinent Vitals/Pain Pain Assessment: No/denies pain    Home Living Family/patient expects to be discharged to:: Private residence Living Arrangements: Alone Available Help at Discharge: Available PRN/intermittently Type of Home: House Home Access: Stairs to enter Entrance Stairs-Rails: Left Home Layout: One level Home Equipment: Shower seat;Toilet riser;Walker - 2 wheels;Adaptive equipment      Prior Function Level of Independence: Independent with assistive device(s);Needs assistance  Gait / Transfers Assistance Needed: reports he uses RW on "bad days" and store scooter when in [place] / Homemaking Assistance Needed: states that he takes sponge baths in a water basin in his room and does not take  showers due to difficuly getting in and out of tub shower     PT Goals (current goals can now be found in the care plan section) Acute Rehab PT Goals Patient Stated Goal: to go home PT Goal Formulation: With patient Time For Goal Achievement: 09/08/15 Potential to Achieve Goals: Good Progress towards PT goals: Progressing toward goals    Frequency  Min 3X/week    PT Plan Current plan remains appropriate     Co-evaluation PT/OT/SLP Co-Evaluation/Treatment: Yes Reason for Co-Treatment: Other (comment) (More efficiency in problem-solving for home mgmnt) PT goals addressed during session: Mobility/safety with mobility;Other (comment) (trial of AFO)       End of Session Equipment Utilized During Treatment: Gait belt Activity Tolerance: Patient tolerated treatment well Patient left: in chair;with call bell/phone within reach;with chair alarm set     Time: 1035-1050 PT Time Calculation (min) (ACUTE ONLY): 15 min  Charges:  $Gait Training: 8-22 mins                    G Codes:      Miguel [name] 08/26/2015, 12:43 PM  Miguel [name], [place] Pager [phone] Office [phone]

## 2015-08-26 NOTE — Evaluation (Signed)
Occupational Therapy Evaluation Patient Details Name: Miguel [name] MRN: [id number] DOB: 05/01/1950 Today's Date: 08/26/2015    History of Present Illness Miguel [name] is a 65 y.o. male, History of CKD stage V, has left arm AV fistula placed over 8 months ago, running back pain has L4-L5 surgery done in the past by Dr. 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 Presented to ED after worsening RLE weakness and fall   Clinical Impression   Pt is at set up- sup level with ADLs and ADL mobility. Pt reports that his LEs fatigue easily and that he doesn't stand or walk around much at home; uses store scooter at [place] and uses microwave for meals. All education completed and recommendations provided for DME and A/E for home use. No further acute OT indicated at this time and pt to continue with acute PT services for functional mobility safety/AFO consult    Follow Up Recommendations  Home health OT;Supervision - Intermittent    Equipment Recommendations  Tub/shower bench;Other (comment) ([place] shoe horn)    Recommendations for Other Services       Precautions / Restrictions Precautions Precautions: Fall      Mobility Bed Mobility Overal bed mobility: Modified Independent                Transfers Overall transfer level: Needs assistance Equipment used: Rolling walker (2 wheeled) Transfers: Sit to/from Stand Sit to Stand: Supervision         General transfer comment:  cues for correct hand placement and safety. PT called [employer] for pt for an orthosis consult due to R foot frop    Balance Overall balance assessment: Needs assistance Sitting-balance support: No upper extremity supported;Feet supported Sitting balance-Leahy Scale: Good     Standing balance support: During functional activity Standing balance-Leahy Scale:  Fair Standing balance comment: pt stood at sink to wash hands,face and to initiate oral care. Completed oral care seated in front of sink                            ADL Overall ADL's : Needs assistance/impaired     Grooming: Wash/dry hands;Wash/dry face;Oral care;Set up;Supervision/safety   Upper Body Bathing: Set up;Sitting   Lower Body Bathing: Set up;Sitting/lateral leans;Sit to/from stand   Upper Body Dressing : Set up;Sitting   Lower Body Dressing: Set up;Sit to/from stand;Sitting/lateral leans   Toilet Transfer: Grab bars;Ambulation;RW;Supervision/safety   Toileting- Clothing Manipulation and Hygiene: Supervision/safety;Sit to/from stand       Functional mobility during ADLs: Supervision/safety General ADL Comments: pt stood at sink to wash hands,face and to initiate oral care. Completed oral care seated in front of sink     Vision  reading glasses (glasses broken), no change from baseline              Pertinent Vitals/Pain Pain Assessment: No/denies pain     Hand Dominance Right   Extremity/Trunk Assessment Upper Extremity Assessment Upper Extremity Assessment: Overall WFL for tasks assessed   Lower Extremity Assessment Lower Extremity Assessment: Defer to PT evaluation       Communication Communication Communication: No difficulties   Cognition Arousal/Alertness: Awake/alert Behavior During Therapy: WFL for tasks assessed/performed Overall Cognitive Status: Within Functional Limits for tasks assessed                     General  Comments   pt very pleasant and cooperative                 Home Living Family/patient expects to be discharged to:: Private residence Living Arrangements: Alone Available Help at Discharge: Available PRN/intermittently Type of Home: House Home Access: Stairs to enter [employer] of Steps: 3 Entrance Stairs-Rails: Left Home Layout: One level     Bathroom Shower/Tub: Arts administrator: [place];Toilet riser;Walker - 2 wheels;Adaptive equipment Adaptive Equipment: [place];Long-handled shoe horn        Prior Functioning/Environment Level of Independence: Independent with assistive device(s);Needs assistance  Gait / Transfers Assistance Needed: reports he uses RW on "bad days" and store scooter when in [place] / Homemaking Assistance Needed: states that he takes sponge baths in a water basin in his room and does not take showers due to difficuly getting in and out of tub shower        OT Diagnosis: Generalized weakness   OT Problem List: Decreased activity tolerance;Impaired balance (sitting and/or standing);Decreased knowledge of use of DME or AE   OT Treatment/Interventions:      OT Goals(Current goals can be found in the care plan section) Acute Rehab OT Goals Patient Stated Goal: to go home  OT Frequency:     Barriers to D/C:    pt at home alone       Co-evaluation  Yes            End of Session Equipment Utilized During Treatment: Gait belt;Rolling walker Nurse Communication: Mobility status  Activity Tolerance: Patient tolerated treatment well;Patient limited by fatigue Patient left: in chair;with call bell/phone within reach;with nursing/sitter in room   Time: 1028-1056 OT Time Calculation (min): 28 min Charges:  OT General Charges $OT Visit: 1 Procedure OT Evaluation $OT Eval Moderate Complexity: 1 Procedure G-Codes: OT G-codes **NOT FOR INPATIENT CLASS** Functional Limitation: Self care Self Care Current Status [id number]): At least 1 percent but less than 20 percent impaired, limited or restricted Self Care Goal Status [id number]): At least 1 percent but less than 20 percent impaired, limited or restricted Self Care Discharge Status [phone]): At least 1 percent but less than 20 percent impaired, limited or restricted  Britt Bottom 08/26/2015, 11:29 AM

## 2015-08-29 ENCOUNTER — Emergency Department (HOSPITAL_COMMUNITY): Payer: Medicare Other

## 2015-08-29 ENCOUNTER — Encounter (HOSPITAL_COMMUNITY): Payer: Self-pay

## 2015-08-29 ENCOUNTER — Emergency Department (HOSPITAL_COMMUNITY)
Admission: EM | Admit: 2015-08-29 | Discharge: 2015-08-29 | Disposition: A | Discharge status: Home / Self Care | Payer: Medicare Other | Attending: Emergency Medicine | Admitting: Emergency Medicine

## 2015-08-29 DIAGNOSIS — I120 Hypertensive chronic kidney disease with stage 5 chronic kidney disease or end stage renal disease: Secondary | ICD-10-CM | POA: Insufficient documentation

## 2015-08-29 DIAGNOSIS — E119 Type 2 diabetes mellitus without complications: Secondary | ICD-10-CM | POA: Insufficient documentation

## 2015-08-29 DIAGNOSIS — Z79899 Other long term (current) drug therapy: Secondary | ICD-10-CM | POA: Diagnosis not present

## 2015-08-29 DIAGNOSIS — R0789 Other chest pain: Secondary | ICD-10-CM | POA: Insufficient documentation

## 2015-08-29 DIAGNOSIS — N185 Chronic kidney disease, stage 5: Secondary | ICD-10-CM | POA: Diagnosis not present

## 2015-08-29 DIAGNOSIS — Z87891 Personal history of nicotine dependence: Secondary | ICD-10-CM | POA: Diagnosis not present

## 2015-08-29 DIAGNOSIS — K5900 Constipation, unspecified: Secondary | ICD-10-CM | POA: Insufficient documentation

## 2015-08-29 LAB — CBC
HCT: 37.6 % — ABNORMAL LOW (ref 39.0–52.0)
Hemoglobin: 11.7 g/dL — ABNORMAL LOW (ref 13.0–17.0)
MCH: 28.8 pg (ref 26.0–34.0)
MCHC: 31.1 g/dL (ref 30.0–36.0)
MCV: 92.6 fL (ref 78.0–100.0)
PLATELETS: 224 10*3/uL (ref 150–400)
RBC: 4.06 MIL/uL — AB (ref 4.22–5.81)
RDW: 13.9 % (ref 11.5–15.5)
WBC: 6 10*3/uL (ref 4.0–10.5)

## 2015-08-29 LAB — HEPATIC FUNCTION PANEL
ALT: 16 U/L — ABNORMAL LOW (ref 17–63)
AST: 16 U/L (ref 15–41)
Albumin: 3.4 g/dL — ABNORMAL LOW (ref 3.5–5.0)
Alkaline Phosphatase: 59 U/L (ref 38–126)
BILIRUBIN TOTAL: 0.3 mg/dL (ref 0.3–1.2)
Total Protein: 7.1 g/dL (ref 6.5–8.1)

## 2015-08-29 LAB — BASIC METABOLIC PANEL
Anion gap: 14 (ref 5–15)
BUN: 83 mg/dL — ABNORMAL HIGH (ref 6–20)
CALCIUM: 9.6 mg/dL (ref 8.9–10.3)
CO2: 20 mmol/L — AB (ref 22–32)
CREATININE: 8.76 mg/dL — AB (ref 0.61–1.24)
Chloride: 106 mmol/L (ref 101–111)
GFR, EST AFRICAN AMERICAN: 6 mL/min — AB (ref >60)
GFR, EST NON AFRICAN AMERICAN: 6 mL/min — AB (ref >60)
GLUCOSE: 96 mg/dL (ref 65–99)
Potassium: 4.8 mmol/L (ref 3.5–5.1)
Sodium: 140 mmol/L (ref 135–145)

## 2015-08-29 LAB — I-STAT TROPONIN, ED
TROPONIN I, POC: 0.01 ng/mL (ref 0.00–0.08)
TROPONIN I, POC: 0.01 ng/mL (ref 0.00–0.08)

## 2015-08-29 MED ORDER — CALCIUM POLYCARBOPHIL 625 MG PO TABS: 1250.0000 mg | ORAL_TABLET | Freq: Every day | ORAL | Status: AC

## 2015-08-29 NOTE — ED Notes (Signed)
Pt brought back from x-ray without incident. Pt connected back to pulse ox, BP cuff and 5-lead. Pt reports the pressure in his chest has went away.

## 2015-08-29 NOTE — Discharge Instructions (Signed)

## 2015-08-29 NOTE — ED Notes (Signed)
Placed patient into a gown and on the monitor for ekg and vitals

## 2015-08-29 NOTE — Care Management Note (Signed)
Case Management Note  Patient Details  Name: Miguel [name] MRN: [id number] Date of Birth: [birth year]  Subjective/Objective:   CM consulted for this 65 y.o. M who was prescribed Veltessa for his Elevated K+. CM spoke with pt to inform him that there is no assistance available through the CM dept for this particular medication, as he has [facility] Medicare. Patrick [place] is very expensive ($ 942.27). He is more than willing to take the less expensive medication, Lactulose,  until he can speak with his PCP and ascertain whether there are available samples.               Action/Plan: Discharge with plans to use Lactulose until he can speak with PCP about samples and look into assistance from [employer] [place] [place]. No further CM needs at this time. Made Gloriann Loan, PA-C aware of above.    Expected Discharge Date:                  Expected Discharge Plan:     In-House Referral:     Discharge planning Services  CM Consult, Medication Assistance  Post Acute Care Choice:    Choice offered to:  Patient  DME Arranged:    DME Agency:     HH Arranged:    [place] Agency:     Status of Service:  Completed, signed off  If discussed at [employer] of Stay Meetings, dates discussed:    Additional Comments:  Delrae Sawyers, RN 08/29/2015, 10:43 AM

## 2015-08-29 NOTE — ED Notes (Signed)
When asked if he was experiencing any CP, pt reports left sided chest pressure that "started just now." Denies SOB. Dr. Lita Mains informed. EKG performed by Otila Kluver, nurse tech and hand delivered to Dr. Lita Mains.

## 2015-08-29 NOTE — ED Provider Notes (Signed)
CSN: [id number]     Arrival date & time 08/29/15  [id number] History   First MD Initiated Contact with Patient 08/29/15 0830     Chief Complaint  Patient presents with  . Constipation  . Chest Pain     (Consider location/radiation/quality/duration/timing/severity/associated sxs/prior Treatment) HPI Miguel [name] is a 65 y.o. male with PMH significant for PAF, CKD, HTN, Dyslipidemia, DM, arthritis who presents with constipation.She reports last bowel movement was 6 days ago on Monday. Of note, patient was admitted to the hospital at same day for hyperkalemia. Discharged Wednesday. Patient reports that today he received a lot of pain medicine and has not been able to have a bowel movement since. No prior treatment. No modifying or alleviating factors. No rectal pain.  He endorses mild abdominal pain and lower back pain.  No b/b incontinence.  Patient reports taking discharge medications as prescribed. He states he has not taken one of the medications because it was $700.  He reported chest pressure at 8 AM today when he was in ED that resolved.  No SOB, dizziness, diaphoresis, N/V.   Past Medical History  Diagnosis Date  . Paroxysmal atrial fibrillation (HCC)   . CKD stage 5 secondary to hypertension ([place])   . Essential hypertension   . Dyslipidemia   . Diabetes mellitus without complication ([place])     no rx  . GERD (gastroesophageal reflux disease)   . Arthritis    Past Surgical History  Procedure Laterality Date  . Back surgery    . Av fistula placement Left 12/24/2012    Procedure: ARTERIOVENOUS (AV) FISTULA CREATION- left radiocephalic ;  Surgeon: Conrad [place], MD;  Location: [facility] OR;  Service: Vascular;  Laterality: Left;  . Colonoscopy      hx: of  . Cataract extraction w/ intraocular lens implant      Hx: of left eye  . Av fistula placement Left 03/18/2013    Procedure: Creation ARTERIOVENOUS FISTULA  left arm;  Surgeon: Conrad [place], MD;  Location: [facility] OR;  Service: Vascular;   Laterality: Left;   Family History  Problem Relation Age of Onset  . Deep vein thrombosis Mother   . Hypertension Mother   . Clotting disorder Mother   . Peripheral vascular disease Brother    Social History  Substance Use Topics  . Smoking status: Former Smoker -- 0.25 packs/day for 30 years    Types: Cigarettes    Quit date: 02/08/2007  . Smokeless tobacco: Never Used     Comment: 09 last alcohol  . Alcohol Use: No    Review of Systems All other systems negative unless otherwise stated in HPI    Allergies  Review of patient's allergies indicates no known allergies.  Home Medications   Prior to Admission medications   Medication Sig Start Date End Date Taking? Authorizing Provider  allopurinol (ZYLOPRIM) 100 MG tablet Take 200 mg by mouth daily.     Historical Provider, MD  amLODipine (NORVASC) 10 MG tablet Take 10 mg by mouth daily.    Historical Provider, MD  calcitRIOL (ROCALTROL) 0.25 MCG capsule Take 0.25-0.5 mcg by mouth daily. Takes 0.98mcg on even days, and 0.51mcg on odd days    Historical Provider, MD  CRESTOR 40 MG tablet Take 40 mg by mouth daily.  03/15/13   Historical Provider, MD  furosemide (LASIX) 80 MG tablet Take 1 tablet (80 mg total) by mouth daily. 08/26/15   Theodis Blaze, MD  furosemide (LASIX) 80 MG tablet  Take 2 tablets (160 mg total) by mouth every evening. 08/27/15   Theodis Blaze, MD  gabapentin (NEURONTIN) 100 MG capsule Take 100 mg by mouth 2 (two) times daily.    Historical Provider, MD  metoprolol succinate (TOPROL-XL) 100 MG 24 hr tablet Take 100 mg by mouth 2 (two) times daily. Take with or immediately following a meal.    Historical Provider, MD  NITROSTAT 0.4 MG SL tablet Place 0.4 mg under the tongue every 5 (five) minutes as needed for chest pain.  12/20/12   Historical Provider, MD  patiromer (VELTASSA) 8.4 g packet Take 1 packet (8.4 g total) by mouth daily. 08/26/15   Theodis Blaze, MD  polycarbophil (FIBERCON) 625 MG tablet Take 2 tablets  (1,250 mg total) by mouth daily. 08/29/15   Gloriann Loan, PA-C  rosuvastatin (CRESTOR) 20 MG tablet Take 20 mg by mouth at bedtime.    Historical Provider, MD  sevelamer carbonate (RENVELA) 800 MG tablet Take 2 tablets (1,600 mg total) by mouth 3 (three) times daily with meals. 08/26/15   Theodis Blaze, MD  sodium bicarbonate 650 MG tablet Take 650 mg by mouth 3 (three) times daily.    Historical Provider, MD  tamsulosin (FLOMAX) 0.4 MG CAPS capsule Take 0.4 mg by mouth at bedtime.    Historical Provider, MD   BP 132/74 mmHg  Pulse 76  Temp(Src) 97.5 F (36.4 C) (Oral)  Resp 14  SpO2 97% Physical Exam  Constitutional: He is oriented to person, place, and time. He appears well-developed and well-nourished.  Non-toxic appearance. He does not have a sickly appearance. He does not appear ill.  HENT:  Head: Normocephalic and atraumatic.  Mouth/Throat: Oropharynx is clear and moist.  Eyes: Conjunctivae are normal. Pupils are equal, round, and reactive to light.  Neck: Normal range of motion. Neck supple.  Cardiovascular: Normal rate and regular rhythm.   Pulmonary/Chest: Effort normal and breath sounds normal. No accessory muscle usage or stridor. No respiratory distress. He has no wheezes. He has no rhonchi. He has no rales.  Abdominal: Soft. Bowel sounds are normal. He exhibits no distension. There is no tenderness. There is no rebound and no guarding.  Musculoskeletal: Normal range of motion.  Lymphadenopathy:    He has no cervical adenopathy.  Neurological: He is alert and oriented to person, place, and time.  Speech clear without dysarthria.  Skin: Skin is warm and dry.  Psychiatric: He has a normal mood and affect. His behavior is normal.    ED Course  Procedures (including critical care time) Labs Review Labs Reviewed  BASIC METABOLIC PANEL - Abnormal; Notable for the following:    CO2 20 (*)    BUN 83 (*)    Creatinine, Ser 8.76 (*)    GFR calc non Af Amer 6 (*)    GFR calc Af  Amer 6 (*)    All other components within normal limits  CBC - Abnormal; Notable for the following:    RBC 4.06 (*)    Hemoglobin 11.7 (*)    HCT 37.6 (*)    All other components within normal limits  HEPATIC FUNCTION PANEL - Abnormal; Notable for the following:    Albumin 3.4 (*)    ALT 16 (*)    Bilirubin, Direct <0.1 (*)    All other components within normal limits  I-STAT TROPOININ, ED  Randolm Idol, ED    Imaging Review Dg Chest 2 View  08/29/2015  CLINICAL DATA:  Chest pain.  Diabetes.  Hypertension. EXAM: CHEST  2 VIEW COMPARISON:  08/24/2015 FINDINGS: Lateral view degraded by patient arm position. Midline trachea. Normal heart size and mediastinal contours. No pleural effusion or pneumothorax. Mild bibasilar atelectasis. No congestive failure. IMPRESSION: No acute cardiopulmonary disease. Cardiomegaly without congestive failure. Electronically Signed   By: Abigail Miyamoto M.D.   On: 08/29/2015 08:52   Dg Abd 1 View  08/29/2015  CLINICAL DATA:  65 year old male with left lower abdominal pain and constipation for the last 5 days EXAM: ABDOMEN - 1 VIEW COMPARISON:  Prior abdominal radiographs 02/21/2006 FINDINGS: The bowel gas pattern is normal. No evidence of obstruction. Unremarkable colonic stool burden. No interval change in the appearance of a small volume of gas within small bowel in the left hemi abdomen compared to prior imaging. This is likely normal for this patient. No abnormal calcification or organomegaly. Multilevel degenerative disc disease particularly in the lower lumbar spine. Mild to moderate right hip joint osteoarthritis. IMPRESSION: 1. Normal bowel gas pattern. 2. Unremarkable colonic stool burden. No radiographic evidence of fecal impaction or constipation. 3. Multilevel degenerative disc disease. 4. Mild right hip joint osteoarthritis. Electronically Signed   By: Jacqulynn Cadet M.D.   On: 08/29/2015 10:01   I have personally reviewed and evaluated these images  and lab results as part of my medical decision-making.   EKG Interpretation   Date/Time:  Saturday August 29 2015 07:51:40 EDT Ventricular Rate:  73 PR Interval:    QRS Duration: 99 QT Interval:  381 QTC Calculation: 420 R Axis:   11 Text Interpretation:  Atrial-paced complexes Low voltage, precordial leads  Confirmed by YELVERTON  MD, DAVID ([id number]) on 08/29/2015 9:51:49 AM      MDM   Final diagnoses:  Constipation, unspecified constipation type  Chest pressure   Patient presents with constipation.  Last BM x 6 days.  Denies rectal pain.  Patient was admitted 6 days ago for hyperkalemia 2/2 to CKD 2/2 to HTN.  He also complained of CP upon arrival to ED that lasted 30 minutes and resolved. Delta troponin 0.01.  EKG not acute.  CXR normal.  Low suspicion for ACS.  K stable, 4.8.  Creatinine 8.76, no evidence of metabolic acidosis.  Abdominal plain films negative for constipation or obstruction.  Doubt acute intra-abdominal pathology.  Discharge home with [place].  Follow up PCP.  Return precautions discussed.  Patient agrees and acknowledges the above plan for discharge.     Gloriann Loan, PA-C 08/29/15 1609   Julianne Rice, MD 09/13/15 [phone]

## 2015-08-29 NOTE — ED Notes (Signed)
Room air O2 sats 921%, pt placed on 2L [place]

## 2015-08-29 NOTE — ED Notes (Signed)
Pt here with c/o lower back pain and constipation, LBM on Monday. He states he was seen here on Monday "for my kidneys" and was given a lot of pain medications. A&OX4. Hx of kidney failure and about to be put back on dialysis.

## 2016-01-24 ENCOUNTER — Inpatient Hospital Stay (HOSPITAL_COMMUNITY): Payer: Medicare Other

## 2016-01-24 ENCOUNTER — Encounter (HOSPITAL_COMMUNITY): Payer: Self-pay

## 2016-01-24 ENCOUNTER — Emergency Department (HOSPITAL_COMMUNITY): Payer: Medicare Other

## 2016-01-24 ENCOUNTER — Inpatient Hospital Stay (HOSPITAL_COMMUNITY)
Admission: EM | Admit: 2016-01-24 | Discharge: 2016-03-10 | DRG: 004 | Disposition: E | Payer: Medicare Other | Attending: Internal Medicine | Admitting: Internal Medicine

## 2016-01-24 DIAGNOSIS — D638 Anemia in other chronic diseases classified elsewhere: Secondary | ICD-10-CM | POA: Diagnosis present

## 2016-01-24 DIAGNOSIS — Q211 Atrial septal defect: Secondary | ICD-10-CM

## 2016-01-24 DIAGNOSIS — J189 Pneumonia, unspecified organism: Secondary | ICD-10-CM | POA: Diagnosis present

## 2016-01-24 DIAGNOSIS — K219 Gastro-esophageal reflux disease without esophagitis: Secondary | ICD-10-CM | POA: Diagnosis present

## 2016-01-24 DIAGNOSIS — E875 Hyperkalemia: Secondary | ICD-10-CM | POA: Diagnosis present

## 2016-01-24 DIAGNOSIS — Z992 Dependence on renal dialysis: Secondary | ICD-10-CM | POA: Diagnosis not present

## 2016-01-24 DIAGNOSIS — J9601 Acute respiratory failure with hypoxia: Secondary | ICD-10-CM

## 2016-01-24 DIAGNOSIS — Z8249 Family history of ischemic heart disease and other diseases of the circulatory system: Secondary | ICD-10-CM

## 2016-01-24 DIAGNOSIS — E785 Hyperlipidemia, unspecified: Secondary | ICD-10-CM | POA: Diagnosis present

## 2016-01-24 DIAGNOSIS — Z6841 Body Mass Index (BMI) 40.0 and over, adult: Secondary | ICD-10-CM

## 2016-01-24 DIAGNOSIS — J9622 Acute and chronic respiratory failure with hypercapnia: Secondary | ICD-10-CM

## 2016-01-24 DIAGNOSIS — Z430 Encounter for attention to tracheostomy: Secondary | ICD-10-CM

## 2016-01-24 DIAGNOSIS — Y95 Nosocomial condition: Secondary | ICD-10-CM | POA: Diagnosis present

## 2016-01-24 DIAGNOSIS — E872 Acidosis: Secondary | ICD-10-CM | POA: Diagnosis present

## 2016-01-24 DIAGNOSIS — J80 Acute respiratory distress syndrome: Secondary | ICD-10-CM | POA: Diagnosis present

## 2016-01-24 DIAGNOSIS — Z87891 Personal history of nicotine dependence: Secondary | ICD-10-CM | POA: Diagnosis not present

## 2016-01-24 DIAGNOSIS — Z452 Encounter for adjustment and management of vascular access device: Secondary | ICD-10-CM

## 2016-01-24 DIAGNOSIS — J9690 Respiratory failure, unspecified, unspecified whether with hypoxia or hypercapnia: Secondary | ICD-10-CM

## 2016-01-24 DIAGNOSIS — E1140 Type 2 diabetes mellitus with diabetic neuropathy, unspecified: Secondary | ICD-10-CM | POA: Diagnosis present

## 2016-01-24 DIAGNOSIS — G9340 Encephalopathy, unspecified: Secondary | ICD-10-CM | POA: Diagnosis not present

## 2016-01-24 DIAGNOSIS — Z66 Do not resuscitate: Secondary | ICD-10-CM

## 2016-01-24 DIAGNOSIS — I4891 Unspecified atrial fibrillation: Secondary | ICD-10-CM | POA: Diagnosis not present

## 2016-01-24 DIAGNOSIS — J9600 Acute respiratory failure, unspecified whether with hypoxia or hypercapnia: Secondary | ICD-10-CM | POA: Diagnosis present

## 2016-01-24 DIAGNOSIS — K5900 Constipation, unspecified: Secondary | ICD-10-CM | POA: Diagnosis not present

## 2016-01-24 DIAGNOSIS — M109 Gout, unspecified: Secondary | ICD-10-CM | POA: Diagnosis present

## 2016-01-24 DIAGNOSIS — R400 Somnolence: Secondary | ICD-10-CM | POA: Diagnosis not present

## 2016-01-24 DIAGNOSIS — A419 Sepsis, unspecified organism: Principal | ICD-10-CM | POA: Diagnosis present

## 2016-01-24 DIAGNOSIS — J810 Acute pulmonary edema: Secondary | ICD-10-CM | POA: Diagnosis not present

## 2016-01-24 DIAGNOSIS — R4020 Unspecified coma: Secondary | ICD-10-CM | POA: Diagnosis not present

## 2016-01-24 DIAGNOSIS — T829XXA Unspecified complication of cardiac and vascular prosthetic device, implant and graft, initial encounter: Secondary | ICD-10-CM

## 2016-01-24 DIAGNOSIS — R579 Shock, unspecified: Secondary | ICD-10-CM | POA: Diagnosis not present

## 2016-01-24 DIAGNOSIS — R4182 Altered mental status, unspecified: Secondary | ICD-10-CM

## 2016-01-24 DIAGNOSIS — R6521 Severe sepsis with septic shock: Secondary | ICD-10-CM | POA: Diagnosis present

## 2016-01-24 DIAGNOSIS — R0602 Shortness of breath: Secondary | ICD-10-CM | POA: Diagnosis present

## 2016-01-24 DIAGNOSIS — I132 Hypertensive heart and chronic kidney disease with heart failure and with stage 5 chronic kidney disease, or end stage renal disease: Secondary | ICD-10-CM | POA: Diagnosis present

## 2016-01-24 DIAGNOSIS — N186 End stage renal disease: Secondary | ICD-10-CM | POA: Diagnosis present

## 2016-01-24 DIAGNOSIS — I480 Paroxysmal atrial fibrillation: Secondary | ICD-10-CM | POA: Diagnosis present

## 2016-01-24 DIAGNOSIS — R0600 Dyspnea, unspecified: Secondary | ICD-10-CM

## 2016-01-24 DIAGNOSIS — R401 Stupor: Secondary | ICD-10-CM

## 2016-01-24 DIAGNOSIS — Z4659 Encounter for fitting and adjustment of other gastrointestinal appliance and device: Secondary | ICD-10-CM

## 2016-01-24 DIAGNOSIS — Z515 Encounter for palliative care: Secondary | ICD-10-CM | POA: Diagnosis not present

## 2016-01-24 DIAGNOSIS — N179 Acute kidney failure, unspecified: Secondary | ICD-10-CM | POA: Diagnosis not present

## 2016-01-24 DIAGNOSIS — J9611 Chronic respiratory failure with hypoxia: Secondary | ICD-10-CM | POA: Diagnosis not present

## 2016-01-24 DIAGNOSIS — J9621 Acute and chronic respiratory failure with hypoxia: Secondary | ICD-10-CM | POA: Diagnosis not present

## 2016-01-24 DIAGNOSIS — Z832 Family history of diseases of the blood and blood-forming organs and certain disorders involving the immune mechanism: Secondary | ICD-10-CM

## 2016-01-24 DIAGNOSIS — G9341 Metabolic encephalopathy: Secondary | ICD-10-CM | POA: Diagnosis present

## 2016-01-24 DIAGNOSIS — N185 Chronic kidney disease, stage 5: Secondary | ICD-10-CM | POA: Diagnosis not present

## 2016-01-24 DIAGNOSIS — E669 Obesity, unspecified: Secondary | ICD-10-CM | POA: Diagnosis present

## 2016-01-24 DIAGNOSIS — E1122 Type 2 diabetes mellitus with diabetic chronic kidney disease: Secondary | ICD-10-CM | POA: Diagnosis present

## 2016-01-24 DIAGNOSIS — Z978 Presence of other specified devices: Secondary | ICD-10-CM

## 2016-01-24 LAB — COMPREHENSIVE METABOLIC PANEL
ALBUMIN: 3.3 g/dL — AB (ref 3.5–5.0)
ALK PHOS: 103 U/L (ref 38–126)
ALT: 47 U/L (ref 17–63)
ANION GAP: 9 (ref 5–15)
AST: 35 U/L (ref 15–41)
BILIRUBIN TOTAL: 0.4 mg/dL (ref 0.3–1.2)
BUN: 56 mg/dL — AB (ref 6–20)
CALCIUM: 9.6 mg/dL (ref 8.9–10.3)
CO2: 24 mmol/L (ref 22–32)
CREATININE: 6.34 mg/dL — AB (ref 0.61–1.24)
Chloride: 111 mmol/L (ref 101–111)
GFR calc Af Amer: 9 mL/min — ABNORMAL LOW (ref >60)
GFR calc non Af Amer: 8 mL/min — ABNORMAL LOW (ref >60)
GLUCOSE: 111 mg/dL — AB (ref 65–99)
Potassium: 5.4 mmol/L — ABNORMAL HIGH (ref 3.5–5.1)
SODIUM: 144 mmol/L (ref 135–145)
Total Protein: 6.9 g/dL (ref 6.5–8.1)

## 2016-01-24 LAB — CBC WITH DIFFERENTIAL/PLATELET
BASOS ABS: 0 10*3/uL (ref 0.0–0.1)
BASOS PCT: 0 %
EOS ABS: 0.1 10*3/uL (ref 0.0–0.7)
Eosinophils Relative: 1 %
HEMATOCRIT: 32.8 % — AB (ref 39.0–52.0)
HEMOGLOBIN: 9.7 g/dL — AB (ref 13.0–17.0)
Lymphocytes Relative: 14 %
Lymphs Abs: 1.1 10*3/uL (ref 0.7–4.0)
MCH: 28.6 pg (ref 26.0–34.0)
MCHC: 29.6 g/dL — ABNORMAL LOW (ref 30.0–36.0)
MCV: 96.8 fL (ref 78.0–100.0)
Monocytes Absolute: 0.4 10*3/uL (ref 0.1–1.0)
Monocytes Relative: 5 %
NEUTROS ABS: 6.1 10*3/uL (ref 1.7–7.7)
NEUTROS PCT: 80 %
Platelets: 181 10*3/uL (ref 150–400)
RBC: 3.39 MIL/uL — AB (ref 4.22–5.81)
RDW: 18.8 % — ABNORMAL HIGH (ref 11.5–15.5)
WBC: 7.6 10*3/uL (ref 4.0–10.5)

## 2016-01-24 LAB — I-STAT ARTERIAL BLOOD GAS, ED
Acid-base deficit: 5 mmol/L — ABNORMAL HIGH (ref 0.0–2.0)
Acid-base deficit: 5 mmol/L — ABNORMAL HIGH (ref 0.0–2.0)
BICARBONATE: 25.9 mmol/L (ref 20.0–28.0)
BICARBONATE: 24.7 mmol/L (ref 20.0–28.0)
O2 SAT: 96 %
O2 Saturation: 91 %
PCO2 ART: 84.8 mmHg — AB (ref 32.0–48.0)
PCO2 ART: 71.9 mmHg — AB (ref 32.0–48.0)
PO2 ART: 118 mmHg — AB (ref 83.0–108.0)
Patient temperature: 98.6
TCO2: 28 mmol/L (ref 0–100)
TCO2: 27 mmol/L (ref 0–100)
pH, Arterial: 7.093 — CL (ref 7.350–7.450)
pH, Arterial: 7.143 — CL (ref 7.350–7.450)
pO2, Arterial: 82 mmHg — ABNORMAL LOW (ref 83.0–108.0)

## 2016-01-24 LAB — CBC
HEMATOCRIT: 28.9 % — AB (ref 39.0–52.0)
HEMOGLOBIN: 8.8 g/dL — AB (ref 13.0–17.0)
MCH: 28.4 pg (ref 26.0–34.0)
MCHC: 30.4 g/dL (ref 30.0–36.0)
MCV: 93.2 fL (ref 78.0–100.0)
Platelets: 227 10*3/uL (ref 150–400)
RBC: 3.1 MIL/uL — AB (ref 4.22–5.81)
RDW: 18.5 % — ABNORMAL HIGH (ref 11.5–15.5)
WBC: 4.7 10*3/uL (ref 4.0–10.5)

## 2016-01-24 LAB — URINALYSIS, ROUTINE W REFLEX MICROSCOPIC
BILIRUBIN URINE: NEGATIVE
GLUCOSE, UA: 150 mg/dL — AB
Ketones, ur: NEGATIVE mg/dL
LEUKOCYTES UA: NEGATIVE
NITRITE: NEGATIVE
Specific Gravity, Urine: 1.013 (ref 1.005–1.030)
Squamous Epithelial / LPF: NONE SEEN
pH: 5 (ref 5.0–8.0)

## 2016-01-24 LAB — GLUCOSE, CAPILLARY
GLUCOSE-CAPILLARY: 77 mg/dL (ref 65–99)
Glucose-Capillary: 65 mg/dL (ref 65–99)
Glucose-Capillary: 81 mg/dL (ref 65–99)

## 2016-01-24 LAB — RAPID URINE DRUG SCREEN, HOSP PERFORMED
Amphetamines: NOT DETECTED
BARBITURATES: NOT DETECTED
Benzodiazepines: NOT DETECTED
Cocaine: NOT DETECTED
Opiates: NOT DETECTED
TETRAHYDROCANNABINOL: NOT DETECTED

## 2016-01-24 LAB — I-STAT CG4 LACTIC ACID, ED
LACTIC ACID, VENOUS: 1.14 mmol/L (ref 0.5–1.9)
Lactic Acid, Venous: 1.21 mmol/L (ref 0.5–1.9)

## 2016-01-24 LAB — POCT I-STAT 3, ART BLOOD GAS (G3+)
ACID-BASE DEFICIT: 2 mmol/L (ref 0.0–2.0)
BICARBONATE: 23.6 mmol/L (ref 20.0–28.0)
O2 SAT: 94 %
PO2 ART: 57 mmHg — AB (ref 83.0–108.0)
Patient temperature: 90.2
TCO2: 25 mmol/L (ref 0–100)
pCO2 arterial: 36.9 mmHg (ref 32.0–48.0)
pH, Arterial: 7.391 (ref 7.350–7.450)

## 2016-01-24 LAB — TSH: TSH: 2.307 u[IU]/mL (ref 0.350–4.500)

## 2016-01-24 LAB — CORTISOL: Cortisol, Plasma: 9.8 ug/dL

## 2016-01-24 LAB — I-STAT TROPONIN, ED: TROPONIN I, POC: 0.01 ng/mL (ref 0.00–0.08)

## 2016-01-24 LAB — LIPASE, BLOOD: Lipase: 49 U/L (ref 11–51)

## 2016-01-24 LAB — BRAIN NATRIURETIC PEPTIDE: B Natriuretic Peptide: 518.9 pg/mL — ABNORMAL HIGH (ref 0.0–100.0)

## 2016-01-24 MED ORDER — NALOXONE HCL 0.4 MG/ML IJ SOLN
0.2000 mg | Freq: Once | INTRAMUSCULAR | Status: AC
  Administered 2016-01-24: 0.2 mg via INTRAVENOUS

## 2016-01-24 MED ORDER — ORAL CARE MOUTH RINSE: 15.0000 mL | Freq: Four times a day (QID) | OROMUCOSAL | Status: DC

## 2016-01-24 MED ORDER — FENTANYL CITRATE (PF) 100 MCG/2ML IJ SOLN
50.0000 ug | INTRAMUSCULAR | Status: DC | PRN
  Filled 2016-01-24 (×2): qty 2

## 2016-01-24 MED ORDER — FAMOTIDINE IN NACL 20-0.9 MG/50ML-% IV SOLN
20.0000 mg | Freq: Two times a day (BID) | INTRAVENOUS | Status: DC
  Administered 2016-01-24 – 2016-01-25 (×2): 20 mg via INTRAVENOUS
  Filled 2016-01-24 (×3): qty 50

## 2016-01-24 MED ORDER — SODIUM CHLORIDE 0.9 % IV BOLUS (SEPSIS)
500.0000 mL | Freq: Once | INTRAVENOUS | Status: AC
  Administered 2016-01-24: 500 mL via INTRAVENOUS

## 2016-01-24 MED ORDER — INSULIN ASPART 100 UNIT/ML ~~LOC~~ SOLN
0.0000 [IU] | SUBCUTANEOUS | Status: DC
  Administered 2016-01-26 – 2016-01-27 (×4): 3 [IU] via SUBCUTANEOUS
  Administered 2016-01-27 (×2): 4 [IU] via SUBCUTANEOUS
  Administered 2016-01-27 – 2016-01-29 (×11): 3 [IU] via SUBCUTANEOUS
  Administered 2016-01-30: 7 [IU] via SUBCUTANEOUS
  Administered 2016-01-30: 4 [IU] via SUBCUTANEOUS
  Administered 2016-01-30: 3 [IU] via SUBCUTANEOUS
  Administered 2016-01-30: 7 [IU] via SUBCUTANEOUS
  Administered 2016-01-30 (×2): 3 [IU] via SUBCUTANEOUS
  Administered 2016-01-31: 7 [IU] via SUBCUTANEOUS
  Administered 2016-01-31: 4 [IU] via SUBCUTANEOUS
  Administered 2016-01-31: 3 [IU] via SUBCUTANEOUS
  Administered 2016-01-31: 4 [IU] via SUBCUTANEOUS
  Administered 2016-01-31: 7 [IU] via SUBCUTANEOUS
  Administered 2016-01-31: 4 [IU] via SUBCUTANEOUS
  Administered 2016-02-01: 3 [IU] via SUBCUTANEOUS
  Administered 2016-02-01: 4 [IU] via SUBCUTANEOUS
  Administered 2016-02-01 (×2): 3 [IU] via SUBCUTANEOUS
  Administered 2016-02-01 – 2016-02-02 (×8): 4 [IU] via SUBCUTANEOUS
  Administered 2016-02-03 (×2): 7 [IU] via SUBCUTANEOUS
  Administered 2016-02-03 (×2): 4 [IU] via SUBCUTANEOUS
  Administered 2016-02-03 (×2): 3 [IU] via SUBCUTANEOUS
  Administered 2016-02-03: 7 [IU] via SUBCUTANEOUS
  Administered 2016-02-04 – 2016-02-05 (×7): 4 [IU] via SUBCUTANEOUS
  Administered 2016-02-05 – 2016-02-06 (×4): 3 [IU] via SUBCUTANEOUS
  Administered 2016-02-07 (×2): 4 [IU] via SUBCUTANEOUS
  Administered 2016-02-07: 7 [IU] via SUBCUTANEOUS
  Administered 2016-02-07 (×3): 4 [IU] via SUBCUTANEOUS
  Administered 2016-02-08: 7 [IU] via SUBCUTANEOUS
  Administered 2016-02-08: 4 [IU] via SUBCUTANEOUS
  Administered 2016-02-08: 7 [IU] via SUBCUTANEOUS
  Administered 2016-02-08 – 2016-02-09 (×7): 4 [IU] via SUBCUTANEOUS

## 2016-01-24 MED ORDER — MIDAZOLAM HCL 2 MG/2ML IJ SOLN
1.0000 mg | INTRAMUSCULAR | Status: DC | PRN
  Administered 2016-01-24: 1 mg via INTRAVENOUS
  Filled 2016-01-24 (×2): qty 2

## 2016-01-24 MED ORDER — CHLORHEXIDINE GLUCONATE 0.12% ORAL RINSE (MEDLINE KIT): 15.0000 mL | Freq: Two times a day (BID) | OROMUCOSAL | Status: DC

## 2016-01-24 MED ORDER — PRISMASOL BGK 4/2.5 32-4-2.5 MEQ/L IV SOLN
INTRAVENOUS | Status: DC
  Administered 2016-01-24 – 2016-02-03 (×14): via INTRAVENOUS_CENTRAL
  Filled 2016-01-24 (×10): qty 5000

## 2016-01-24 MED ORDER — SODIUM CHLORIDE 0.9 % FOR CRRT
INTRAVENOUS_CENTRAL | Status: DC | PRN
  Administered 2016-01-25: 19:00:00 via INTRAVENOUS_CENTRAL
  Filled 2016-01-24 (×2): qty 1000

## 2016-01-24 MED ORDER — PRISMASOL BGK 4/2.5 32-4-2.5 MEQ/L IV SOLN
INTRAVENOUS | Status: DC
  Administered 2016-01-24 – 2016-02-04 (×88): via INTRAVENOUS_CENTRAL
  Filled 2016-01-24 (×109): qty 5000

## 2016-01-24 MED ORDER — MIDAZOLAM HCL 2 MG/2ML IJ SOLN
5.0000 mg | INTRAMUSCULAR | Status: DC | PRN
  Administered 2016-01-25: 5 mg via INTRAVENOUS
  Filled 2016-01-24: qty 6

## 2016-01-24 MED ORDER — HEPARIN SODIUM (PORCINE) 1000 UNIT/ML DIALYSIS
1000.0000 [IU] | INTRAMUSCULAR | Status: DC | PRN
  Administered 2016-01-24: 3000 [IU] via INTRAVENOUS_CENTRAL
  Administered 2016-02-05: 2800 [IU] via INTRAVENOUS_CENTRAL
  Administered 2016-02-06: 3000 [IU] via INTRAVENOUS_CENTRAL
  Administered 2016-02-09: 2800 [IU] via INTRAVENOUS_CENTRAL
  Filled 2016-01-24: qty 3
  Filled 2016-01-24 (×3): qty 6
  Filled 2016-01-24 (×2): qty 3
  Filled 2016-01-24: qty 6

## 2016-01-24 MED ORDER — PRISMASOL BGK 4/2.5 32-4-2.5 MEQ/L IV SOLN
INTRAVENOUS | Status: DC
  Administered 2016-01-24 – 2016-02-03 (×15): via INTRAVENOUS_CENTRAL
  Filled 2016-01-24 (×20): qty 5000

## 2016-01-24 MED ORDER — ONDANSETRON HCL 4 MG/2ML IJ SOLN
INTRAMUSCULAR | Status: AC
  Filled 2016-01-24: qty 2

## 2016-01-24 MED ORDER — HYDROCORTISONE NA SUCCINATE PF 100 MG IJ SOLR
50.0000 mg | Freq: Four times a day (QID) | INTRAMUSCULAR | Status: DC
  Administered 2016-01-24 – 2016-02-09 (×63): 50 mg via INTRAVENOUS
  Filled 2016-01-24 (×15): qty 1
  Filled 2016-01-24: qty 2
  Filled 2016-01-24 (×27): qty 1
  Filled 2016-01-24: qty 2
  Filled 2016-01-24 (×21): qty 1

## 2016-01-24 MED ORDER — ETOMIDATE 2 MG/ML IV SOLN
INTRAVENOUS | Status: AC | PRN
  Administered 2016-01-24: 30 mg via INTRAVENOUS

## 2016-01-24 MED ORDER — ORAL CARE MOUTH RINSE
15.0000 mL | Freq: Four times a day (QID) | OROMUCOSAL | Status: DC
  Administered 2016-01-24 – 2016-02-09 (×63): 15 mL via OROMUCOSAL

## 2016-01-24 MED ORDER — CHLORHEXIDINE GLUCONATE 0.12% ORAL RINSE (MEDLINE KIT)
15.0000 mL | Freq: Two times a day (BID) | OROMUCOSAL | Status: DC
  Administered 2016-01-24 – 2016-02-09 (×32): 15 mL via OROMUCOSAL

## 2016-01-24 MED ORDER — NALOXONE HCL 0.4 MG/ML IJ SOLN
INTRAMUSCULAR | Status: AC
  Filled 2016-01-24: qty 1

## 2016-01-24 MED ORDER — SODIUM CHLORIDE 0.9 % IV SOLN
INTRAVENOUS | Status: DC
  Administered 2016-01-24 – 2016-02-06 (×3): via INTRAVENOUS

## 2016-01-24 MED ORDER — FENTANYL CITRATE (PF) 100 MCG/2ML IJ SOLN
50.0000 ug | INTRAMUSCULAR | Status: DC | PRN
  Administered 2016-01-24 – 2016-01-25 (×8): 100 ug via INTRAVENOUS
  Filled 2016-01-24 (×7): qty 2

## 2016-01-24 MED ORDER — ROCURONIUM BROMIDE 50 MG/5ML IV SOLN
INTRAVENOUS | Status: AC | PRN
  Administered 2016-01-24: 100 mg via INTRAVENOUS

## 2016-01-24 MED ORDER — DEXTROSE 50 % IV SOLN
INTRAVENOUS | Status: AC
  Administered 2016-01-24: 50 mL
  Filled 2016-01-24: qty 50

## 2016-01-24 MED ORDER — ALTEPLASE 2 MG IJ SOLR
2.0000 mg | Freq: Once | INTRAMUSCULAR | Status: DC | PRN
  Filled 2016-01-24: qty 2

## 2016-01-24 NOTE — Progress Notes (Signed)
eLink Physician-Brief Progress Note Patient Name: Miguel [name] DOB: 07/11/1950 MRN: [id number]   Date of Service  02/01/2016  HPI/Events of Note  Was agitated requiring sedation/ now sedated and bp down on CVVH   Intake/Output Summary (Last 24 hours) at 01/09/2016 2224 Last data filed at 01/11/2016 2200  Gross per 24 hour  Intake            207.5 ml  Output              698 ml  Net           -490.5 ml     No evidence of autopeep on vent No CVP available   eICU Interventions  NS  Bolus x 500 cc and try to adjust sedation meds to minimal levels        [name] 01/16/2016, 10:24 PM

## 2016-01-24 NOTE — ED Notes (Signed)
Critical care at bedside  

## 2016-01-24 NOTE — Progress Notes (Signed)
Called [facility] to update the patient status.  After giving PRN sedation meds as ordered, patient BP dropped.   Also concern vent settings are contributing to BP drop.  Current vent settings 80/16/16/560.  Will continue to monitor.

## 2016-01-24 NOTE — ED Notes (Signed)
Patient's mask was not staying on well; assisted RT with changing of patient's mask; patient resting at this time; visitor at bedside; Jerene Pitch, RN made aware

## 2016-01-24 NOTE — Progress Notes (Signed)
eLink Physician-Brief Progress Note Patient Name: RAMONE BLUMENSTOCK DOB: 1950/08/04 MRN: [id number]   Date of Service  01/29/2016  HPI/Events of Note  New pt eval completed  eICU Interventions  H and P already on chart by PCCM see notes     Intervention Category Major Interventions: Respiratory failure - evaluation and management Evaluation Type: New Patient Evaluation  Asencion Noble 01/14/2016, 4:51 PM

## 2016-01-24 NOTE — ED Notes (Signed)
Nephrology at bedside

## 2016-01-24 NOTE — ED Notes (Signed)
Pt becoming more alert at this time. Family member visiting at bedside.

## 2016-01-24 NOTE — ED Notes (Signed)
Pt continuously pulling mask off. MD aware, will consult CCM. Mask in place at this time.

## 2016-01-24 NOTE — Procedures (Signed)
Central Venous Catheter Insertion Procedure Note Miguel [name] [id number] [birth year]  Procedure: Insertion of hemodialysis catheter Indications: renal failure  Procedure Details Consent: Risks of procedure as well as the alternatives and risks of each were explained to the (patient/caregiver).  Consent for procedure obtained. Time Out: Verified patient identification, verified procedure, site/side was marked, verified correct patient position, special equipment/implants available, medications/allergies/relevent history reviewed, required imaging and test results available.  Performed  Maximum sterile technique was used including antiseptics, cap, gloves, gown, hand hygiene, mask and sheet. Skin prep: Chlorhexidine; local anesthetic administered A antimicrobial bonded/coated triple lumen catheter was placed in the right femoral vein due to patient being a dialysis patient using the Seldinger technique.  Attempted right IJ placement.  Entered vessel, passed guidewire and then dilators.  When attempting to pass HD catheter, the guidewire became unraveled and kinked.  This location aborted.  Placed in right femoral location w/o difficulty.  Evaluation Blood flow good Chest X-ray ordered to verify placement.  CXR: pending.  Miguel Mires, MD [facility] Pulmonary/Critical Care 01/12/2016, 6:12 PM Pager:  [phone] After 3pm call: [phone]

## 2016-01-24 NOTE — Consult Note (Signed)
Renal Service Consult Note [facility]  MCCAULEY FEEZOR 02/05/2016 Sol Blazing Requesting Physician:  Dr Halford Chessman  Reason for Consult:  CKD 5 patient w resp failure/ hypothermia prob sepsis HPI: The patient is a 65 y.o. year-old with history of HTN, DM, gout and CKD dating back to 2009 with records here. Was last here in July 2017 with leg weakness and parox afib, had creat 8 and AVF x 26mos, did not require HD.  [place] home.  F/B [facility].  Presenting today with gen'd weakness, no family here or other history.  Found to be hypothermic (temp 89), acidemic and bilat diffuse edema on CXR. Intubated in ED. Asked to see for renal failure.    PMHx below per chart.  No family here for history.  Called brother listed who didn't know he was here.     Chart review: 2009 - Dizziness while driving, HTN/ DM2, gout, GERD, CKD > had low BP's . All bp meds were held and BP's came up, creat was 4.0 max. Got pressors. Taken off metformin. 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  - CP and SOB, afib, a/ chron renal failure, DM2, CHF, OA. No MI, converted NSR.  2017 - R leg weakness, hist foot drop/ neuropathy d/t DM, also parox AFib, CKD 5/ FSGS, HTN, DM2, R foot drop. Had AVF placed 8 mos prior. Creat 8, did not require HD, not uremic. F/B Dr Nils Flack.      Past Medical History  Past Medical History:  Diagnosis Date  . Arthritis   . CKD stage 5 secondary to hypertension ([place])   . Diabetes mellitus without complication ([place])    no rx  . Dyslipidemia   . Essential hypertension   . GERD (gastroesophageal reflux disease)   . Paroxysmal atrial fibrillation [facility])    Past Surgical History  Past Surgical History:  Procedure Laterality Date  . AV FISTULA PLACEMENT Left 12/24/2012   Procedure: ARTERIOVENOUS (AV) FISTULA CREATION- left radiocephalic ;  Surgeon: Conrad Fontana, MD;  Location: [place];  Service: Vascular;  Laterality: Left;  . AV FISTULA  PLACEMENT Left 03/18/2013   Procedure: Creation ARTERIOVENOUS FISTULA  left arm;  Surgeon: Conrad , MD;  Location: [place];  Service: Vascular;  Laterality: Left;  . BACK SURGERY    . CATARACT EXTRACTION W/ INTRAOCULAR LENS IMPLANT     Hx: of left eye  . COLONOSCOPY     hx: of   Family History  Family History  Problem Relation Age of Onset  . Deep vein thrombosis Mother   . Hypertension Mother   . Clotting disorder Mother   . Peripheral vascular disease Brother    Social History  reports that he quit smoking about 8 years ago. His smoking use included Cigarettes. He has a 7.50 pack-year smoking history. He has never used smokeless tobacco. He reports that he does not drink alcohol or use drugs. Allergies No Known Allergies Home medications Prior to Admission medications   Medication Sig Start Date End Date Taking? Authorizing Provider  allopurinol (ZYLOPRIM) 100 MG tablet Take 200 mg by mouth daily.     Historical Provider, MD  amLODipine (NORVASC) 10 MG tablet Take 10 mg by mouth daily.    Historical Provider, MD  calcitRIOL (ROCALTROL) 0.25 MCG capsule Take 0.25-0.5 mcg by mouth daily. Takes 0.77mcg on even days, and 0.68mcg on odd days    Historical Provider, MD  CRESTOR 40 MG tablet Take 40  mg by mouth daily.  03/15/13   Historical Provider, MD  furosemide (LASIX) 80 MG tablet Take 1 tablet (80 mg total) by mouth daily. 08/26/15   Theodis Blaze, MD  furosemide (LASIX) 80 MG tablet Take 2 tablets (160 mg total) by mouth every evening. 08/27/15   Theodis Blaze, MD  gabapentin (NEURONTIN) 100 MG capsule Take 100 mg by mouth 2 (two) times daily.    Historical Provider, MD  metoprolol succinate (TOPROL-XL) 100 MG 24 hr tablet Take 100 mg by mouth 2 (two) times daily. Take with or immediately following a meal.    Historical Provider, MD  NITROSTAT 0.4 MG SL tablet Place 0.4 mg under the tongue every 5 (five) minutes as needed for chest pain.  12/20/12   Historical Provider, MD  patiromer  (VELTASSA) 8.4 g packet Take 1 packet (8.4 g total) by mouth daily. 08/26/15   Theodis Blaze, MD  polycarbophil (FIBERCON) 625 MG tablet Take 2 tablets (1,250 mg total) by mouth daily. 08/29/15   Gloriann Loan, PA-C  rosuvastatin (CRESTOR) 20 MG tablet Take 20 mg by mouth at bedtime.    Historical Provider, MD  sevelamer carbonate (RENVELA) 800 MG tablet Take 2 tablets (1,600 mg total) by mouth 3 (three) times daily with meals. 08/26/15   Theodis Blaze, MD  sodium bicarbonate 650 MG tablet Take 650 mg by mouth 3 (three) times daily.    Historical Provider, MD  tamsulosin (FLOMAX) 0.4 MG CAPS capsule Take 0.4 mg by mouth at bedtime.    Historical Provider, MD   Liver Function Tests  Recent Labs Lab 01/15/2016 1056  AST 35  ALT 47  ALKPHOS 103  BILITOT 0.4  PROT 6.9  ALBUMIN 3.3*    Recent Labs Lab 01/11/2016 1056  LIPASE 49   CBC  Recent Labs Lab 01/19/2016 1056  WBC 7.6  NEUTROABS 6.1  HGB 9.7*  HCT 32.8*  MCV 96.8  PLT 0000000   Basic Metabolic Panel  Recent Labs Lab 01/23/2016 1056  NA 144  K 5.4*  CL 111  CO2 24  GLUCOSE 111*  BUN 56*  CREATININE 6.34*  CALCIUM 9.6   Iron/TIBC/Ferritin/ %Sat No results found for: IRON, TIBC, FERRITIN, IRONPCTSAT  Vitals:   01/25/2016 1445 02/01/2016 1453 01/30/2016 1515 01/30/2016 1531  BP: 105/74  103/79 99/77  Pulse: 61  (!) 58 (!) 51  Resp: 20  18 18   TempSrc:      SpO2: 100% 97% (!) 89% 90%  Height:  6\' 1"  (1.854 m)     Exam Gen intubated, unresponsive, large AAM  BP 100/70 No rash, cyanosis or gangrene Sclera anicteric, throat w ETT  No jvd or bruits Chest coarse rhonchi w scattered wheezing bilat RRR no MRG, distant HS Abd soft ntnd no mass or ascites +bs marked abd obesity GU normal male MS no joint effusions or deformity Ext 2+ diffuse LE edema / no wounds or ulcers LUA AVF no bruit Neuro is unresponsive on the vent   ABG 7.14/ 72/ 82 Na 144 K 5.4  CO2 24  BUN 56  Cr 6.34  Alb 3.3 LFTs' wnl BNP 518 WBC 7k  Hb 9.7 ptl  181 CXR diffuse bilat infiltrates compatible with CHF/ edema  Assessment: 1. Renal failure stage 5 - creat 6, was 8 in July. Hx bx-proven FSGS. Has AVF L arm no bruit.  2. Severe acidemia 3. Hypothermia prob sepsis 4. Pulm - diffuse infiltrates, ARDS vs other 5. LE edema 6. Hypotension  7. DM2  Plan - will need CRRT, have dw CCM. Orders written.    Kelly Splinter MD [employer] pager [phone]   02/06/2016, 3:43 PM

## 2016-01-24 NOTE — H&P (Signed)
PCCM Admission Note  Admission date: 01/21/2016 Referring provider: Dr. Sherry Ruffing, ER  CC: feels weak  HPI: Hx from chart.  65 yo male brought to ER with generalized weakness.  He had SpO2 78%.  ABG showed hypercapnia.  He was tried on Bipap w/o significant improvement.  He has CKD 5 and CXR showed pulmonary edema.  He had temperature 52F.  He was intubated in ER.  PMHx: He  has a past medical history of Arthritis; CKD stage 5 secondary to hypertension ([place]); Diabetes mellitus without complication ([place]); Dyslipidemia; Essential hypertension; GERD (gastroesophageal reflux disease); and Paroxysmal atrial fibrillation ([place]).  PSHx: He  has a past surgical history that includes Back surgery; AV fistula placement (Left, 12/24/2012); Colonoscopy; Cataract extraction w/ intraocular lens implant; and AV fistula placement (Left, 03/18/2013).  FHx: His family history includes Clotting disorder in his mother; Deep vein thrombosis in his mother; Hypertension in his mother; Peripheral vascular disease in his brother.  SHx: He  reports that he quit smoking about 8 years ago. His smoking use included Cigarettes. He has a 7.50 pack-year smoking history. He has never used smokeless tobacco. He reports that he does not drink alcohol or use drugs.  Allergies: No Known Allergies  No current facility-administered medications on file prior to encounter.    Current Outpatient Prescriptions on File Prior to Encounter  Medication Sig  . allopurinol (ZYLOPRIM) 100 MG tablet Take 200 mg by mouth daily.   Marland Kitchen amLODipine (NORVASC) 10 MG tablet Take 10 mg by mouth daily.  . calcitRIOL (ROCALTROL) 0.25 MCG capsule Take 0.25-0.5 mcg by mouth daily. Takes 0.60mcg on even days, and 0.35mcg on odd days  . CRESTOR 40 MG tablet Take 40 mg by mouth daily.   . furosemide (LASIX) 80 MG tablet Take 1 tablet (80 mg total) by mouth daily.  . furosemide (LASIX) 80 MG tablet Take 2 tablets (160 mg total) by mouth every evening.  .  gabapentin (NEURONTIN) 100 MG capsule Take 100 mg by mouth 2 (two) times daily.  . metoprolol succinate (TOPROL-XL) 100 MG 24 hr tablet Take 100 mg by mouth 2 (two) times daily. Take with or immediately following a meal.  . NITROSTAT 0.4 MG SL tablet Place 0.4 mg under the tongue every 5 (five) minutes as needed for chest pain.   . patiromer (VELTASSA) 8.4 g packet Take 1 packet (8.4 g total) by mouth daily.  . polycarbophil (FIBERCON) 625 MG tablet Take 2 tablets (1,250 mg total) by mouth daily.  . rosuvastatin (CRESTOR) 20 MG tablet Take 20 mg by mouth at bedtime.  . sevelamer carbonate (RENVELA) 800 MG tablet Take 2 tablets (1,600 mg total) by mouth 3 (three) times daily with meals.  . sodium bicarbonate 650 MG tablet Take 650 mg by mouth 3 (three) times daily.  . tamsulosin (FLOMAX) 0.4 MG CAPS capsule Take 0.4 mg by mouth at bedtime.    ROS: Unable to obtain  Vital signs: BP 105/74   Pulse 61   Resp 20   SpO2 100%   Intake/output: No intake/output data recorded.  General: obtunded, warming blanket on Neuro: opens eyes briefly with stimulation HEENT: bipap mask on Cardiac: bradycardic, regular, no murmur Chest: poor air movement, decreased BS Abd: obese, soft, non tender, decreased bowel sounds Ext: 1+ edema Skin: venous stasis changes  CMP Latest Ref Rng & Units 01/23/2016 08/29/2015 08/26/2015  Glucose 65 - 99 mg/dL 111(H) 96 112(H)  BUN 6 - 20 mg/dL 56(H) 83(H) 77(H)  Creatinine 0.61 -  1.24 mg/dL 6.34(H) 8.76(H) 8.63(H)  Sodium 135 - 145 mmol/L 144 140 139  Potassium 3.5 - 5.1 mmol/L 5.4(H) 4.8 4.7  Chloride 101 - 111 mmol/L 111 106 109  CO2 22 - 32 mmol/L 24 20(L) 19(L)  Calcium 8.9 - 10.3 mg/dL 9.6 9.6 9.2  Total Protein 6.5 - 8.1 g/dL 6.9 7.1 -  Total Bilirubin 0.3 - 1.2 mg/dL 0.4 0.3 -  Alkaline Phos 38 - 126 U/L 103 59 -  AST 15 - 41 U/L 35 16 -  ALT 17 - 63 U/L 47 16(L) -    CBC Latest Ref Rng & Units 01/13/2016 08/29/2015 08/26/2015  WBC 4.0 - 10.5 K/uL 7.6  6.0 6.2  Hemoglobin 13.0 - 17.0 g/dL 9.7(L) 11.7(L) 10.0(L)  Hematocrit 39.0 - 52.0 % 32.8(L) 37.6(L) 32.1(L)  Platelets 150 - 400 K/uL 181 224 203    ABG    Component Value Date/Time   PHART 7.143 (LL) 02/04/2016 1343   PCO2ART 71.9 (HH) 01/23/2016 1343   PO2ART 82.0 (L) 02/05/2016 1343   HCO3 24.7 01/20/2016 1343   TCO2 27 01/10/2016 1343   ACIDBASEDEF 5.0 (H) 01/20/2016 1343   O2SAT 91.0 01/31/2016 1343    CBG (last 3)  No results for input(s): GLUCAP in the last 72 hours.   Imaging: Dg Chest Portable 1 View  Result Date: 02/03/2016 CLINICAL DATA:  Hypoxia.  Altered mental status EXAM: PORTABLE CHEST 1 VIEW COMPARISON:  08/29/2015 FINDINGS: Cardiomegaly. Diffuse bilateral airspace opacities compatible with moderate to severe edema/ CHF. Possible small layering effusions. IMPRESSION: Moderate to severe CHF changes. Electronically Signed   By: Rolm Baptise M.D.   On: 01/22/2016 11:26    Studies: CT head 12/17 >>  Cultures: Blood 12/17 >> Sputum 12/17 >>  Antitibiotics:  Lines/tubes:  ETT 12/17 >>  Events:  12/17 admit, renal consulted  Assessment/plan:  Acute hypoxic/hypercapnic respiratory failure in setting of pulmonary edema. - full vent support - f/u CXR, ABG  CKD 5. Hyperkalemia. Pulmonary edema. - renal consulted to assess for dialysis  Hx of HTN, HLD. - hold norvasc, lasix, crestor, toprol - check Echo  DM. - SSI  Anemia of chronic disease. - f/u CBC  Hypothermia. - check TSH, cortisol, UDS, blood/sputum culture - warming blanket  Acute metabolic encephalopathy. - check CT head - RASS goal 0 to -1 - hold outpt neurontin  DVT prophylaxis - SCDs SUP - pepcid Nutrition - NPO Goals of care - full code  No family available  CC time 81 minutes  Chesley Mires, MD [place] 02/04/2016, 2:57 PM Pager:  [phone] After 3pm call: [phone]

## 2016-01-24 NOTE — Progress Notes (Signed)
eLink Physician-Brief Progress Note Patient Name: Miguel [name] DOB: [birth year] MRN: [id number]   Date of Service  01/27/2016  HPI/Events of Note  Pt with ARDS and severe hypoxemia.    eICU Interventions  Adjust vent to 7cc/kg and rate to 26 and Peep to 16. Start ARDS protocol     Intervention Category Major Interventions: Respiratory failure - evaluation and management  Asencion Noble 01/15/2016, 3:37 PM

## 2016-01-24 NOTE — ED Provider Notes (Signed)
[facility] DEPT Provider Note   CSN: [id number] Arrival date & time: 02/06/2016  1046     History   Chief Complaint Chief Complaint  Patient presents with  . Fatigue  . Shortness of Breath    HPI Miguel [name] is a 65 y.o. male With a past medical history significant for atrial fibrillation, chronic kidney disease last requiring dialysis in the 1990s, hypertension, diabetes, and GERD who presents with altered mental status, respiratory distress, and hypoxia. According to EMS, family reported the patient was "acting sleepy" the last few days. They reported that the patient began having difficulty breathing today prompting them to seek evaluation. According to EMS, patient had an option saturations in the low 70s upon their arrival. After BiPAP, patient had improvement into the 90% range. Patient is intermittently able to answer questions but is lethargic. Patient denies pain but does report a cough and shortness of breath. He reports swelling in his legs. He denies any neurologic deficits or headache. He denies any falls. He denies any fevers or chills. Is difficult to obtain full history due to necessity of BiPAP. If patient removes BiPAP, his oxygen drops into the 70s.    The history is provided by the patient, the EMS personnel and medical records. The history is limited by the condition of the patient.  Shortness of Breath  This is a new problem. The average episode lasts 1 day. The problem occurs continuously.The current episode started 12 to 24 hours ago. The problem has been gradually worsening. Associated symptoms include cough and leg swelling. Pertinent negatives include no fever, no rhinorrhea, no neck pain, no chest pain, no vomiting and no abdominal pain. He has tried nothing for the symptoms.    Past Medical History:  Diagnosis Date  . Arthritis   . CKD stage 5 secondary to hypertension ([place])   . Diabetes mellitus without complication ([place])    no rx  . Dyslipidemia    . Essential hypertension   . GERD (gastroesophageal reflux disease)   . Paroxysmal atrial fibrillation [facility])     Patient Active Problem List   Diagnosis Date Noted  . AKI (acute kidney injury) ([place])   . Right foot drop 08/24/2015  . ESRD (end stage renal disease) ([place]) 08/24/2015  . Paroxysmal atrial fibrillation ([facility])   . CKD stage 5 secondary to hypertension ([place])   . Essential hypertension   . Dyslipidemia   . Chronic kidney disease (CKD), stage IV (severe) ([place]) 11/09/2012    Past Surgical History:  Procedure Laterality Date  . AV FISTULA PLACEMENT Left 12/24/2012   Procedure: ARTERIOVENOUS (AV) FISTULA CREATION- left radiocephalic ;  Surgeon: Conrad [place], MD;  Location: [facility];  Service: Vascular;  Laterality: Left;  . AV FISTULA PLACEMENT Left 03/18/2013   Procedure: Creation ARTERIOVENOUS FISTULA  left arm;  Surgeon: Conrad [place], MD;  Location: [facility];  Service: Vascular;  Laterality: Left;  . BACK SURGERY    . CATARACT EXTRACTION W/ INTRAOCULAR LENS IMPLANT     Hx: of left eye  . COLONOSCOPY     hx: of       Home Medications    Prior to Admission medications   Medication Sig Start Date End Date Taking? Authorizing Provider  allopurinol (ZYLOPRIM) 100 MG tablet Take 200 mg by mouth daily.     Historical Provider, MD  amLODipine (NORVASC) 10 MG tablet Take 10 mg by mouth daily.    Historical Provider, MD  calcitRIOL (ROCALTROL) 0.25 MCG capsule  Take 0.25-0.5 mcg by mouth daily. Takes 0.62mg on even days, and 0.231m on odd days    Historical Provider, MD  CRESTOR 40 MG tablet Take 40 mg by mouth daily.  03/15/13   Historical Provider, MD  furosemide (LASIX) 80 MG tablet Take 1 tablet (80 mg total) by mouth daily. 08/26/15   IsTheodis BlazeMD  furosemide (LASIX) 80 MG tablet Take 2 tablets (160 mg total) by mouth every evening. 08/27/15   IsTheodis BlazeMD  gabapentin (NEURONTIN) 100 MG capsule Take 100 mg by mouth 2 (two) times daily.    Historical Provider, MD    metoprolol succinate (TOPROL-XL) 100 MG 24 hr tablet Take 100 mg by mouth 2 (two) times daily. Take with or immediately following a meal.    Historical Provider, MD  NITROSTAT 0.4 MG SL tablet Place 0.4 mg under the tongue every 5 (five) minutes as needed for chest pain.  12/20/12   Historical Provider, MD  patiromer (VELTASSA) 8.4 g packet Take 1 packet (8.4 g total) by mouth daily. 08/26/15   IsTheodis BlazeMD  polycarbophil (FIBERCON) 625 MG tablet Take 2 tablets (1,250 mg total) by mouth daily. 08/29/15   KaGloriann LoanPA-C  rosuvastatin (CRESTOR) 20 MG tablet Take 20 mg by mouth at bedtime.    Historical Provider, MD  sevelamer carbonate (RENVELA) 800 MG tablet Take 2 tablets (1,600 mg total) by mouth 3 (three) times daily with meals. 08/26/15   IsTheodis BlazeMD  sodium bicarbonate 650 MG tablet Take 650 mg by mouth 3 (three) times daily.    Historical Provider, MD  tamsulosin (FLOMAX) 0.4 MG CAPS capsule Take 0.4 mg by mouth at bedtime.    Historical Provider, MD    Family History Family History  Problem Relation Age of Onset  . Deep vein thrombosis Mother   . Hypertension Mother   . Clotting disorder Mother   . Peripheral vascular disease Brother     Social History Social History  Substance Use Topics  . Smoking status: Former Smoker    Packs/day: 0.25    Years: 30.00    Types: Cigarettes    Quit date: 02/08/2007  . Smokeless tobacco: Never Used     Comment: 09 last alcohol  . Alcohol use No     Allergies   Patient has no known allergies.   Review of Systems Review of Systems  Unable to perform ROS: Mental status change  Constitutional: Negative for fever.  HENT: Negative for rhinorrhea.   Respiratory: Positive for cough and shortness of breath.   Cardiovascular: Positive for leg swelling. Negative for chest pain.  Gastrointestinal: Negative for abdominal pain and vomiting.  Musculoskeletal: Negative for neck pain.     Physical Exam Updated Vital Signs BP 152/87  (BP Location: Right Arm)   Pulse 71   Resp 23   SpO2 100%   Physical Exam  Constitutional: He appears well-developed and well-nourished. He appears lethargic. He appears distressed.  HENT:  Head: Normocephalic.  Mouth/Throat: Oropharyngeal exudate (gurgling fluids heard in throat) present.  Eyes: Conjunctivae are normal.  Pinpoint pupils on arrival.    Neck: Normal range of motion.  Cardiovascular: Normal rate, regular rhythm, normal heart sounds and intact distal pulses.   No murmur heard. Pulmonary/Chest: Accessory muscle usage present. No stridor. Tachypnea noted. He is in respiratory distress. He has decreased breath sounds. He has no wheezes. He has rales (profound). He exhibits no tenderness.  Abdominal: Soft. He exhibits no distension.  There is no tenderness.  Musculoskeletal: He exhibits edema. He exhibits no tenderness.  Neurological: He appears lethargic. No cranial nerve deficit or sensory deficit. He exhibits normal muscle tone. GCS eye subscore is 3. GCS verbal subscore is 4. GCS motor subscore is 6.  Skin: Capillary refill takes less than 2 seconds. He is not diaphoretic. No erythema. No pallor.  Nursing note and vitals reviewed.    ED Treatments / Results  Labs (all labs ordered are listed, but only abnormal results are displayed) Labs Reviewed  CBC WITH DIFFERENTIAL/PLATELET - Abnormal; Notable for the following:       Result Value   RBC 3.39 (*)    Hemoglobin 9.7 (*)    HCT 32.8 (*)    MCHC 29.6 (*)    RDW 18.8 (*)    All other components within normal limits  COMPREHENSIVE METABOLIC PANEL - Abnormal; Notable for the following:    Potassium 5.4 (*)    Glucose, Bld 111 (*)    BUN 56 (*)    Creatinine, Ser 6.34 (*)    Albumin 3.3 (*)    GFR calc non Af Amer 8 (*)    GFR calc Af Amer 9 (*)    All other components within normal limits  BRAIN NATRIURETIC PEPTIDE - Abnormal; Notable for the following:    B Natriuretic Peptide 518.9 (*)    All other  components within normal limits  URINALYSIS, ROUTINE W REFLEX MICROSCOPIC - Abnormal; Notable for the following:    Glucose, UA 150 (*)    Hgb urine dipstick SMALL (*)    Protein, ur >=300 (*)    Bacteria, UA RARE (*)    All other components within normal limits  CBC - Abnormal; Notable for the following:    RBC 3.10 (*)    Hemoglobin 8.8 (*)    HCT 28.9 (*)    RDW 18.5 (*)    All other components within normal limits  I-STAT ARTERIAL BLOOD GAS, ED - Abnormal; Notable for the following:    pH, Arterial 7.093 (*)    pCO2 arterial 84.8 (*)    pO2, Arterial 118.0 (*)    Acid-base deficit 5.0 (*)    All other components within normal limits  I-STAT ARTERIAL BLOOD GAS, ED - Abnormal; Notable for the following:    pH, Arterial 7.143 (*)    pCO2 arterial 71.9 (*)    pO2, Arterial 82.0 (*)    Acid-base deficit 5.0 (*)    All other components within normal limits  POCT I-STAT 3, ART BLOOD GAS (G3+) - Abnormal; Notable for the following:    pO2, Arterial 57.0 (*)    All other components within normal limits  CULTURE, BLOOD (ROUTINE X 2)  CULTURE, BLOOD (ROUTINE X 2)  CULTURE, RESPIRATORY (NON-EXPECTORATED)  LIPASE, BLOOD  TSH  CORTISOL  RAPID URINE DRUG SCREEN, HOSP PERFORMED  GLUCOSE, CAPILLARY  GLUCOSE, CAPILLARY  COMPREHENSIVE METABOLIC PANEL  BLOOD GAS, ARTERIAL  CBC  MAGNESIUM  APTT  PHOSPHORUS  I-STAT CG4 LACTIC ACID, ED  I-STAT TROPOININ, ED  I-STAT CG4 LACTIC ACID, ED  I-STAT TROPOININ, ED    EKG  EKG Interpretation  Date/Time:  Sunday January 24 2016 11:07:47 EST Ventricular Rate:  65 PR Interval:    QRS Duration: 106 QT Interval:  448 QTC Calculation: 466 R Axis:   26 Text Interpretation:  Sinus rhythm When compared to prior, no significant changes were seen.  No STEMI Confirmed by [facility] MD, [place] [phone]) on 01/26/2016  11:22:40 AM       Radiology Ct Head Wo Contrast  Result Date: 01/30/2016 CLINICAL DATA:  Generalized weakness and lethargy  EXAM: CT HEAD WITHOUT CONTRAST TECHNIQUE: Contiguous axial images were obtained from the base of the skull through the vertex without intravenous contrast. COMPARISON:  August 24, 2015 FINDINGS: Brain: No subdural, epidural, or subarachnoid hemorrhage. No acute cortical ischemia or infarct. The cerebellum, brainstem, and basal cisterns are normal. No mass, mass effect, or midline shift. Ventricles and sulci are unremarkable. Vascular: Calcified atherosclerosis in the intracranial carotid arteries. Skull: Normal. Negative for fracture or focal lesion. Sinuses/Orbits: Mucous retention cyst in the right maxillary sinus. Sinuses, mastoid air cells, and middle ears otherwise normal. Other: None. IMPRESSION: 1. No acute intracranial process. Electronically Signed   By: Dorise Bullion III M.D   On: 02/01/2016 16:14   Dg Chest Port 1 View  Result Date: 01/29/2016 CLINICAL DATA:  Encounter for central line placement. No repeat necessary per MD. EXAM: PORTABLE CHEST 1 VIEW COMPARISON:  02/04/2016 FINDINGS: Endotracheal tube is in place with tip 5.5 cm above the carina. A nasogastric tube is in place, tip beyond the gastroesophageal junction off the image. No central lines are identified. Heart size is enlarged. There are bilateral pleural effusions. Bibasilar opacities have increased consistent with edema and/or infectious process. No pneumothorax. IMPRESSION: 1. Endotracheal tube and nasogastric tube. 2. No central line identified despite history. 3. Increased bibasilar opacities consistent with atelectasis and infiltrates. Electronically Signed   By: Nolon Nations M.D.   On: 02/06/2016 18:41   Dg Chest Portable 1 View  Result Date: 01/26/2016 CLINICAL DATA:  ET tube placement EXAM: PORTABLE CHEST 1 VIEW COMPARISON:  01/12/2016 FINDINGS: Endotracheal tube is 5 cm above the carina. NG tube is in place with the tip in the stomach. Cardiomegaly. Diffuse bilateral airspace disease, likely edema, not significantly  changed. IMPRESSION: ET tube 5 cm above the carina. Diffuse bilateral airspace disease compatible with edema/ CHF. Electronically Signed   By: Rolm Baptise M.D.   On: 01/11/2016 15:05   Dg Chest Portable 1 View  Result Date: 01/31/2016 CLINICAL DATA:  Hypoxia.  Altered mental status EXAM: PORTABLE CHEST 1 VIEW COMPARISON:  08/29/2015 FINDINGS: Cardiomegaly. Diffuse bilateral airspace opacities compatible with moderate to severe edema/ CHF. Possible small layering effusions. IMPRESSION: Moderate to severe CHF changes. Electronically Signed   By: Rolm Baptise M.D.   On: 01/09/2016 11:26    Procedures Procedure Name: Intubation Date/Time: 01/15/2016 9:08 PM Performed by: Courtney Paris Pre-anesthesia Checklist: Patient identified, Emergency Drugs available, Suction available, Patient being monitored and Timeout performed Oxygen Delivery Method: Ambu bag Preoxygenation: ambu bag and bilevel for preoxygenation, preoxygenation only up into 80's.  Intubation Type: Rapid sequence Ventilation: Mask ventilation without difficulty Laryngoscope Size: Glidescope Grade View: Grade I Tube size: 8.0 mm Number of attempts: 1 Placement Confirmation: ETT inserted through vocal cords under direct vision,  Positive ETCO2 and Breath sounds checked- equal and bilateral Secured at: 28 cm Tube secured with: ETT holder Dental Injury: Teeth and Oropharynx as per pre-operative assessment       (including critical care time)  CRITICAL CARE Performed by: Gwenyth Allegra Tegeler Total critical care time: 45 minutes Critical care time was exclusive of separately billable procedures and treating other patients. Critical care was necessary to treat or prevent imminent or life-threatening deterioration. Critical care was time spent personally by me on the following activities: development of treatment plan with patient and/or surrogate as well as nursing,  discussions with consultants, evaluation of patient's  response to treatment, examination of patient, obtaining history from patient or surrogate, ordering and performing treatments and interventions, ordering and review of laboratory studies, ordering and review of radiographic studies, pulse oximetry and re-evaluation of patient's condition.     Medications Ordered in ED Medications  ondansetron (ZOFRAN) 4 MG/2ML injection (not administered)  famotidine (PEPCID) IVPB 20 mg premix (not administered)  insulin aspart (novoLOG) injection 0-20 Units (0 Units Subcutaneous Not Given 01/30/2016 2000)  0.9 %  sodium chloride infusion ( Intravenous Transfusing/Transfer 01/13/2016 1534)  alteplase (CATHFLO ACTIVASE) injection 2 mg (not administered)  heparin injection 1,000-6,000 Units (3,000 Units CRRT Given 02/07/2016 1856)  sodium chloride 0.9 % primer fluid for CRRT (not administered)  prismasol BGK 4/2.5 5,000 mL dialysis replacement fluid ( CRRT New Bag/Given 01/25/2016 2053)  prismasol BGK 4/2.5 5,000 mL dialysis replacement fluid ( CRRT New Bag/Given 01/21/2016 2053)  prismasol BGK 4/2.5 5,000 mL dialysis solution ( CRRT New Bag/Given 01/17/2016 2053)  hydrocortisone sodium succinate (SOLU-CORTEF) 100 MG injection 50 mg (not administered)  chlorhexidine gluconate (MEDLINE KIT) (PERIDEX) 0.12 % solution 15 mL (15 mLs Mouth Rinse Given 01/11/2016 2050)  MEDLINE mouth rinse (not administered)  fentaNYL (SUBLIMAZE) injection 50-100 mcg (100 mcg Intravenous Given 01/25/2016 2112)  midazolam (VERSED) injection 5 mg (0 mg Intravenous Hold 01/29/2016 2108)  naloxone [facility]) injection 0.2 mg (0.2 mg Intravenous Given 01/27/2016 1102)  etomidate (AMIDATE) injection (30 mg Intravenous Given 02/03/2016 1427)  rocuronium (ZEMURON) injection (100 mg Intravenous Given 01/08/2016 1428)     Initial Impression / Assessment and Plan / ED Course  I have reviewed the triage vital signs and the nursing notes.  Pertinent labs & imaging results that were available during my care of the  patient were reviewed by me and considered in my medical decision making (see chart for details).  Clinical Course    Miguel [name] is a 65 y.o. male With a past medical history significant for atrial fibrillation, chronic kidney disease last requiring dialysis in the 1990s, hypertension, diabetes, and GERD who presents with altered mental status, respiratory distress, and hypoxia.  History and exam are seen above. Level V caveat for altered mental status.  Patient had profound rales on pulmonary exam. Patient also extremely edematous and lower extremities. This edema was symmetric and bilateral. Patient had no chest tenderness. Patient's abdomen was nontender.  Patient followed all commands but was lethargic.  On reassessment, patient had slightly worsen mental status. Patient had blood gas showing respiratory acidosis with a pH of 7.0 and a CO2 in the 80s.  Patient found to have creatinine greater than 6.   Suspect hypoxic respiratory failure secondary to worsening kidney function in fluid retention. However, given elevated creatinine, do not feel comfortable giving Lasix at this time for diuresis.  Critical care was called and they recommended intubation. Patient subsequently intubated without difficulty. Etomidate and Roc was used For RSI due to concern for kidney dysfunction and elevated potassium.   Patient had improvement in oxygenation. Nephrology was called at the direction of critical care. They will see the patient after admission for possible dialysis and fluid removal.  Laboratory testing showed negative troponin, elevated BNP, and slightly improved gas after BiPAP just before Intubation.  Patient admitted to critical-care for further management of his hypoxic respiratory failure likely secondary to fluid overload from kidney failure or CHF.  No family was present during workup or decision-making process.     Final Clinical Impressions(s) / ED  Diagnoses   Final  diagnoses:  Altered mental state  Respiratory failure ([place])  Central line complication     Clinical Impression: 1. Altered mental state   2. Respiratory failure ([place])   3. Central line complication     Disposition: Admit to Critical care service with nephrology following    Courtney Paris, MD 02/06/2016 2130

## 2016-01-24 NOTE — Progress Notes (Signed)
eLink Physician-Brief Progress Note Patient Name: Miguel [name] DOB: 1950/07/07 MRN: [id number]   Date of Service  01/13/2016  HPI/Events of Note  Respiratory failure on vent, Acute renal failure  eICU Interventions  Start ARDS protocol, start CRRT     Intervention Category Major Interventions: Respiratory failure - evaluation and management  Asencion Noble 01/26/2016, 4:50 PM

## 2016-01-24 NOTE — Code Documentation (Signed)
RT and Tegeler MD at bedside to prepare for intubation

## 2016-01-24 NOTE — ED Triage Notes (Addendum)
[facility]- pt from home c/o generalized weakness. Pt noted to be 78% on RA. Pt placed on CPAP with rales throughout, pt received one nitro tablet PTA. Vitals stable with EMS. CBG 131. Pt lethargic on arrival.

## 2016-01-24 NOTE — Progress Notes (Addendum)
eLink Physician-Brief Progress Note Patient Name: ATHAN ULEP DOB: October 17, 1950 MRN: [id number]   Date of Service  01/11/2016  HPI/Events of Note  Agitated on vent   eICU Interventions  Has prn versed / fent > rec up to 50-100 fent and 5 mg versed q1 h prn before trying continuous drips  Soft restraints approp/ ordered      Intervention Category Major Interventions: Delirium, psychosis, severe agitation - evaluation and management  [name] 02/07/2016, 9:04 PM

## 2016-01-24 NOTE — ED Notes (Signed)
MD aware of pt's temperature.

## 2016-01-24 NOTE — ED Notes (Signed)
Pt removed CPAP mask himself. RT at bedside to help replace the mask. O2 saturation of 78% without CPAP mask. Up to 92% after it was replaced.

## 2016-01-24 NOTE — ED Notes (Signed)
Attempted report 

## 2016-01-24 NOTE — ED Notes (Signed)
Patient undressed, in gown, on monitor, continuous pulse oximetry and blood pressure cuff; RT maintaining airway

## 2016-01-25 ENCOUNTER — Inpatient Hospital Stay (HOSPITAL_COMMUNITY): Payer: Medicare Other

## 2016-01-25 DIAGNOSIS — J9600 Acute respiratory failure, unspecified whether with hypoxia or hypercapnia: Secondary | ICD-10-CM

## 2016-01-25 DIAGNOSIS — R0600 Dyspnea, unspecified: Secondary | ICD-10-CM

## 2016-01-25 DIAGNOSIS — R4020 Unspecified coma: Secondary | ICD-10-CM

## 2016-01-25 LAB — GLUCOSE, CAPILLARY
GLUCOSE-CAPILLARY: 76 mg/dL (ref 65–99)
GLUCOSE-CAPILLARY: 90 mg/dL (ref 65–99)
GLUCOSE-CAPILLARY: 117 mg/dL — AB (ref 65–99)
Glucose-Capillary: 118 mg/dL — ABNORMAL HIGH (ref 65–99)
Glucose-Capillary: 82 mg/dL (ref 65–99)
Glucose-Capillary: 98 mg/dL (ref 65–99)

## 2016-01-25 LAB — ECHOCARDIOGRAM COMPLETE
CHL CUP RV SYS PRESS: 29 mmHg
CHL CUP TV REG PEAK VELOCITY: 256 cm/s
EERAT: 9.78
FS: 35 % (ref 28–44)
HEIGHTINCHES: 73 in
IV/PV OW: 0.9
LA diam index: 1.37 cm/m2
LASIZE: 37 mm
LAVOLA4C: 65.8 mL
LEFT ATRIUM END SYS DIAM: 37 mm
LV E/e'average: 9.78
LVEEMED: 9.78
LVELAT: 5.01 cm/s
LVOT area: 5.31 cm2
LVOT diameter: 26 mm
MVPKAVEL: 76.6 m/s
MVPKEVEL: 49 m/s
PW: 11.1 mm — AB (ref 0.6–1.1)
RV LATERAL S' VELOCITY: 10.6 cm/s
TAPSE: 21.4 mm
TDI e' lateral: 5.01
TDI e' medial: 3.26
TRMAXVEL: 256 cm/s
WEIGHTICAEL: 5467.41 [oz_av]

## 2016-01-25 LAB — COMPREHENSIVE METABOLIC PANEL
ALBUMIN: 2.7 g/dL — AB (ref 3.5–5.0)
ALK PHOS: 88 U/L (ref 38–126)
ALT: 44 U/L (ref 17–63)
AST: 34 U/L (ref 15–41)
Anion gap: 10 (ref 5–15)
BILIRUBIN TOTAL: 0.4 mg/dL (ref 0.3–1.2)
BUN: 43 mg/dL — AB (ref 6–20)
CALCIUM: 8.8 mg/dL — AB (ref 8.9–10.3)
CO2: 24 mmol/L (ref 22–32)
CREATININE: 4.7 mg/dL — AB (ref 0.61–1.24)
Chloride: 108 mmol/L (ref 101–111)
GFR calc Af Amer: 14 mL/min — ABNORMAL LOW (ref >60)
GFR, EST NON AFRICAN AMERICAN: 12 mL/min — AB (ref >60)
GLUCOSE: 152 mg/dL — AB (ref 65–99)
Potassium: 4.5 mmol/L (ref 3.5–5.1)
Sodium: 142 mmol/L (ref 135–145)
TOTAL PROTEIN: 5.8 g/dL — AB (ref 6.5–8.1)

## 2016-01-25 LAB — BLOOD GAS, ARTERIAL
Acid-base deficit: 0.5 mmol/L (ref 0.0–2.0)
Bicarbonate: 24.7 mmol/L (ref 20.0–28.0)
DRAWN BY: 362771
FIO2: 70
O2 Saturation: 95.5 %
PEEP: 14 cmH2O
PH ART: 7.328 — AB (ref 7.350–7.450)
Patient temperature: 98.6
RATE: 16 resp/min
VT: 560 mL
pCO2 arterial: 48.5 mmHg — ABNORMAL HIGH (ref 32.0–48.0)
pO2, Arterial: 90.4 mmHg (ref 83.0–108.0)

## 2016-01-25 LAB — RENAL FUNCTION PANEL
ALBUMIN: 2.8 g/dL — AB (ref 3.5–5.0)
ANION GAP: 9 (ref 5–15)
BUN: 33 mg/dL — ABNORMAL HIGH (ref 6–20)
CALCIUM: 9.1 mg/dL (ref 8.9–10.3)
CO2: 24 mmol/L (ref 22–32)
CREATININE: 4 mg/dL — AB (ref 0.61–1.24)
Chloride: 109 mmol/L (ref 101–111)
GFR, EST AFRICAN AMERICAN: 16 mL/min — AB (ref >60)
GFR, EST NON AFRICAN AMERICAN: 14 mL/min — AB (ref >60)
Glucose, Bld: 116 mg/dL — ABNORMAL HIGH (ref 65–99)
PHOSPHORUS: 4.6 mg/dL (ref 2.5–4.6)
Potassium: 4.5 mmol/L (ref 3.5–5.1)
SODIUM: 142 mmol/L (ref 135–145)

## 2016-01-25 LAB — CBC
HEMATOCRIT: 27.1 % — AB (ref 39.0–52.0)
HEMOGLOBIN: 8 g/dL — AB (ref 13.0–17.0)
MCH: 27.9 pg (ref 26.0–34.0)
MCHC: 29.5 g/dL — ABNORMAL LOW (ref 30.0–36.0)
MCV: 94.4 fL (ref 78.0–100.0)
PLATELETS: 147 10*3/uL — AB (ref 150–400)
RBC: 2.87 MIL/uL — AB (ref 4.22–5.81)
RDW: 18.3 % — ABNORMAL HIGH (ref 11.5–15.5)
WBC: 6.1 10*3/uL (ref 4.0–10.5)

## 2016-01-25 LAB — PHOSPHORUS: Phosphorus: 5.1 mg/dL — ABNORMAL HIGH (ref 2.5–4.6)

## 2016-01-25 LAB — POCT I-STAT TROPONIN I: Troponin i, poc: 0.02 ng/mL (ref 0.00–0.08)

## 2016-01-25 LAB — MRSA PCR SCREENING: MRSA by PCR: NEGATIVE

## 2016-01-25 LAB — MAGNESIUM: Magnesium: 2.1 mg/dL (ref 1.7–2.4)

## 2016-01-25 LAB — APTT: aPTT: 35 seconds (ref 24–36)

## 2016-01-25 MED ORDER — VITAL HIGH PROTEIN PO LIQD
1000.0000 mL | ORAL | Status: DC
  Administered 2016-01-25 – 2016-02-07 (×14): 1000 mL
  Filled 2016-01-25: qty 1000

## 2016-01-25 MED ORDER — SODIUM CHLORIDE 0.9 % IV SOLN
INTRAVENOUS | Status: DC
  Administered 2016-01-25 – 2016-02-05 (×2): via INTRAVENOUS

## 2016-01-25 MED ORDER — FENTANYL BOLUS VIA INFUSION
25.0000 ug | INTRAVENOUS | Status: DC | PRN
  Administered 2016-02-03 – 2016-02-04 (×2): 50 ug via INTRAVENOUS
  Administered 2016-02-04 – 2016-02-05 (×3): 25 ug via INTRAVENOUS
  Filled 2016-01-25: qty 25

## 2016-01-25 MED ORDER — PRO-STAT SUGAR FREE PO LIQD
30.0000 mL | Freq: Four times a day (QID) | ORAL | Status: DC
  Administered 2016-01-25 – 2016-02-09 (×51): 30 mL
  Filled 2016-01-25 (×63): qty 30

## 2016-01-25 MED ORDER — SODIUM CHLORIDE 0.9 % IV SOLN
25.0000 ug/h | INTRAVENOUS | Status: DC
  Administered 2016-01-25: 100 ug/h via INTRAVENOUS
  Administered 2016-01-25: 200 ug/h via INTRAVENOUS
  Administered 2016-01-26: 300 ug/h via INTRAVENOUS
  Administered 2016-01-26: 400 ug/h via INTRAVENOUS
  Administered 2016-01-27 – 2016-01-28 (×3): 200 ug/h via INTRAVENOUS
  Administered 2016-01-28: 180 ug/h via INTRAVENOUS
  Administered 2016-01-29 – 2016-01-30 (×4): 200 ug/h via INTRAVENOUS
  Administered 2016-01-31: 150 ug/h via INTRAVENOUS
  Administered 2016-01-31: 200 ug/h via INTRAVENOUS
  Administered 2016-02-01 (×2): 100 ug/h via INTRAVENOUS
  Administered 2016-02-02 – 2016-02-03 (×3): 200 ug/h via INTRAVENOUS
  Administered 2016-02-04 (×2): 250 ug/h via INTRAVENOUS
  Administered 2016-02-05: 300 ug/h via INTRAVENOUS
  Administered 2016-02-05: 325 ug/h via INTRAVENOUS
  Administered 2016-02-06 (×2): 400 ug/h via INTRAVENOUS
  Filled 2016-01-25 (×29): qty 50

## 2016-01-25 MED ORDER — FENTANYL CITRATE (PF) 100 MCG/2ML IJ SOLN
50.0000 ug | Freq: Once | INTRAMUSCULAR | Status: AC
  Administered 2016-01-25: 50 ug via INTRAVENOUS

## 2016-01-25 MED ORDER — DEXTROSE 50 % IV SOLN
INTRAVENOUS | Status: AC
  Administered 2016-01-25: 50 mL
  Filled 2016-01-25: qty 50

## 2016-01-25 MED ORDER — MIDAZOLAM HCL 2 MG/2ML IJ SOLN
1.0000 mg | INTRAMUSCULAR | Status: DC | PRN
  Administered 2016-01-25 (×5): 4 mg via INTRAVENOUS
  Administered 2016-01-25: 2 mg via INTRAVENOUS
  Administered 2016-01-25 (×2): 4 mg via INTRAVENOUS
  Administered 2016-01-25: 2 mg via INTRAVENOUS
  Administered 2016-01-25 – 2016-01-26 (×2): 4 mg via INTRAVENOUS
  Administered 2016-01-26: 2 mg via INTRAVENOUS
  Administered 2016-01-26 (×2): 4 mg via INTRAVENOUS
  Filled 2016-01-25 (×4): qty 4
  Filled 2016-01-25 (×2): qty 2
  Filled 2016-01-25: qty 4
  Filled 2016-01-25: qty 2
  Filled 2016-01-25 (×6): qty 4

## 2016-01-25 MED ORDER — FAMOTIDINE IN NACL 20-0.9 MG/50ML-% IV SOLN
20.0000 mg | INTRAVENOUS | Status: DC
  Administered 2016-01-26 – 2016-02-09 (×15): 20 mg via INTRAVENOUS
  Filled 2016-01-25 (×16): qty 50

## 2016-01-25 NOTE — Progress Notes (Signed)
[facility] Progress Note Patient Name: Miguel [name] DOB: Jun 28, 1950 MRN: [id number]   Date of Service  01/25/2016  HPI/Events of Note  Nurse is concerned that 5 mg pushes of versed PRN is causing hypotension.  eICU Interventions  Will change order for versed to range from 1-5 mg PRN        Lacinda Curvin 01/25/2016, 5:06 AM

## 2016-01-25 NOTE — Progress Notes (Signed)
PCCM Admission Note  Admission date: 01/21/2016 Referring provider: Dr. Sherry Ruffing, ER  CC: feels weak  HPI: Hx from chart.  65 yo male brought to ER 12/17 with generalized weakness.  He had SpO2 78%.  ABG showed hypercapnia.  He was tried on Bipap w/o significant improvement.  He has CKD 5 and CXR showed pulmonary edema.  He had temperature 6F.  He was intubated in ER.  Subjective/Interval Hx:  Remains on CVVHD.  BP soft but slightly improved, not on pressors.   Significant agitation this am, nearly self extubated.   Vital signs: BP (!) 108/59   Pulse 87   Temp 98.4 F (36.9 C) (Oral)   Resp 16   Ht 6\' 1"  (1.854 m)   Wt (!) 155 kg (341 lb 11.4 oz)   SpO2 94%   BMI 45.08 kg/m   Intake/output: I/O last 3 completed shifts: In: 527.5 [I.V.:477.5; IV Piggyback:50] Out: 1083 [Urine:725; Other:358]  General: sedated, NAD on vent, CVVHD  Neuro: significant agitation this morning requiring multiple nurses to prevent self extubation, now sedated RASS -2  HEENT: ETT Cardiac: regular, no murmur Chest: resps even non labored on vent, coarse, bibasilar crackles  Abd: obese, soft, non tender, decreased bowel sounds Ext: 1+ edema Skin: venous stasis changes  CMP Latest Ref Rng & Units 01/25/2016 02/01/2016 08/29/2015  Glucose 65 - 99 mg/dL 152(H) 111(H) 96  BUN 6 - 20 mg/dL 43(H) 56(H) 83(H)  Creatinine 0.61 - 1.24 mg/dL 4.70(H) 6.34(H) 8.76(H)  Sodium 135 - 145 mmol/L 142 144 140  Potassium 3.5 - 5.1 mmol/L 4.5 5.4(H) 4.8  Chloride 101 - 111 mmol/L 108 111 106  CO2 22 - 32 mmol/L 24 24 20(L)  Calcium 8.9 - 10.3 mg/dL 8.8(L) 9.6 9.6  Total Protein 6.5 - 8.1 g/dL 5.8(L) 6.9 7.1  Total Bilirubin 0.3 - 1.2 mg/dL 0.4 0.4 0.3  Alkaline Phos 38 - 126 U/L 88 103 59  AST 15 - 41 U/L 34 35 16  ALT 17 - 63 U/L 44 47 16(L)    CBC Latest Ref Rng & Units 01/25/2016 01/12/2016 02/07/2016  WBC 4.0 - 10.5 K/uL 6.1 4.7 7.6  Hemoglobin 13.0 - 17.0 g/dL 8.0(L) 8.8(L) 9.7(L)  Hematocrit 39.0 -  52.0 % 27.1(L) 28.9(L) 32.8(L)  Platelets 150 - 400 K/uL 147(L) 227 181    ABG    Component Value Date/Time   PHART 7.328 (L) 01/25/2016 0345   PCO2ART 48.5 (H) 01/25/2016 0345   PO2ART 90.4 01/25/2016 0345   HCO3 24.7 01/25/2016 0345   TCO2 25 01/08/2016 1647   ACIDBASEDEF 0.5 01/25/2016 0345   O2SAT 95.5 01/25/2016 0345    CBG (last 3)   Recent Labs  01/25/16 0018 01/25/16 0333 01/25/16 0825  GLUCAP 118* 76 82     Imaging: Ct Head Wo Contrast  Result Date: 01/12/2016 CLINICAL DATA:  Generalized weakness and lethargy EXAM: CT HEAD WITHOUT CONTRAST TECHNIQUE: Contiguous axial images were obtained from the base of the skull through the vertex without intravenous contrast. COMPARISON:  August 24, 2015 FINDINGS: Brain: No subdural, epidural, or subarachnoid hemorrhage. No acute cortical ischemia or infarct. The cerebellum, brainstem, and basal cisterns are normal. No mass, mass effect, or midline shift. Ventricles and sulci are unremarkable. Vascular: Calcified atherosclerosis in the intracranial carotid arteries. Skull: Normal. Negative for fracture or focal lesion. Sinuses/Orbits: Mucous retention cyst in the right maxillary sinus. Sinuses, mastoid air cells, and middle ears otherwise normal. Other: None. IMPRESSION: 1. No acute intracranial process. Electronically Signed  By: Dorise Bullion III M.D   On: 01/11/2016 16:14   Dg Chest Port 1 View  Result Date: 01/25/2016 CLINICAL DATA:  Acute respiratory failure EXAM: PORTABLE CHEST 1 VIEW COMPARISON:  01/16/2016 FINDINGS: Cardiac shadow is stable. Endotracheal tube is again seen in satisfactory position. Nasogastric catheter courses towards the stomach. Bibasilar changes are again seen without significant improvement. No bony abnormality is seen. IMPRESSION: Stable appearance of the chest from the previous day. Electronically Signed   By: Inez Catalina M.D.   On: 01/25/2016 07:27   Dg Chest Port 1 View  Result Date:  01/12/2016 CLINICAL DATA:  Encounter for central line placement. No repeat necessary per MD. EXAM: PORTABLE CHEST 1 VIEW COMPARISON:  01/19/2016 FINDINGS: Endotracheal tube is in place with tip 5.5 cm above the carina. A nasogastric tube is in place, tip beyond the gastroesophageal junction off the image. No central lines are identified. Heart size is enlarged. There are bilateral pleural effusions. Bibasilar opacities have increased consistent with edema and/or infectious process. No pneumothorax. IMPRESSION: 1. Endotracheal tube and nasogastric tube. 2. No central line identified despite history. 3. Increased bibasilar opacities consistent with atelectasis and infiltrates. Electronically Signed   By: Nolon Nations M.D.   On: 01/12/2016 18:41   Dg Chest Portable 1 View  Result Date: 01/16/2016 CLINICAL DATA:  ET tube placement EXAM: PORTABLE CHEST 1 VIEW COMPARISON:  01/28/2016 FINDINGS: Endotracheal tube is 5 cm above the carina. NG tube is in place with the tip in the stomach. Cardiomegaly. Diffuse bilateral airspace disease, likely edema, not significantly changed. IMPRESSION: ET tube 5 cm above the carina. Diffuse bilateral airspace disease compatible with edema/ CHF. Electronically Signed   By: Rolm Baptise M.D.   On: 01/12/2016 15:05   Dg Chest Portable 1 View  Result Date: 01/21/2016 CLINICAL DATA:  Hypoxia.  Altered mental status EXAM: PORTABLE CHEST 1 VIEW COMPARISON:  08/29/2015 FINDINGS: Cardiomegaly. Diffuse bilateral airspace opacities compatible with moderate to severe edema/ CHF. Possible small layering effusions. IMPRESSION: Moderate to severe CHF changes. Electronically Signed   By: Rolm Baptise M.D.   On: 01/10/2016 11:26    Studies: CT head 12/17 >>neg acute   Cultures: Blood 12/17 >> Sputum 12/17 >>  Antitibiotics:  Lines/tubes:  ETT 12/17 >>  Events:  12/17 admit, renal consulted   ASSESSMENT / PLAN:  PULMONARY Acute hypoxic/hypercapnic respiratory failure in  setting of pulmonary edema. P:   Vent support - 8cc/kg  F/u CXR  F/u ABG Volume removal as able   CARDIOVASCULAR Pulmonary edema  Hx of HTN, HLD. Hypothermia. P:  hold norvasc, lasix, crestor, toprol Echo pending  TSH, UDS normal  Cortisol=9.8 Warming blanket  Continue stress steroids   RENAL CKD 5. Hyperkalemia. P:   F/u chem  Renal following  CVVHD ongoing   GASTROINTESTINAL No active issue  P:   PPI  NPO   HEMATOLOGIC Anemia of chronic disease  P:  F/u CBC SCD's   INFECTIOUS No active issue  P:   Trend wbc, fever curve   ENDOCRINE DM   P:   SSI   NEUROLOGIC Acute metabolic encephalopathy. CT head neg  P:   RASS goal 0 to -1 Continue to hold outpt neurontin  Will add fentanyl gtt  Daily WUA   No family available 12/18.    Miguel Madrid, NP 01/25/2016  11:11 AM Pager: [phone] or [phone]  STAFF NOTE: Linwood Dibbles, MD FACP have personally reviewed patient's available data, including medical history,  events of note, physical examination and test results as part of my evaluation. I have discussed with resident/NP and other care providers such as pharmacist, RN and RRT. In addition, I personally evaluated patient and elicited key findings of: sedated rass -1, NOT FC, non purposeful movements, crackles, pcxr improved but with pulm edema, not on pressors, would favor neg 50-100 per hour cvvhd, echo awaited, fent drip likely needs to increase, when to fio2 50% then peep 10 as goal, rate needs increase to 20, abg in am , pcxr in am no infectio noted, I updated daughter in full The patient is critically ill with multiple organ systems failure and requires high complexity decision making for assessment and support, frequent evaluation and titration of therapies, application of advanced monitoring technologies and extensive interpretation of multiple databases.   Critical Care Time devoted to patient care services described in this  note is 35 Minutes. This time reflects time of care of this signee: Merrie Roof, MD FACP. This critical care time does not reflect procedure time, or teaching time or supervisory time of PA/NP/Med student/Med Resident etc but could involve care discussion time. Rest per NP/medical resident whose note is outlined above and that I agree with   Lavon Paganini. Titus Mould, MD, [place] Pgr: [facility] 01/25/2016 12:45 PM

## 2016-01-25 NOTE — Progress Notes (Addendum)
Initial Nutrition Assessment  DOCUMENTATION CODES:   Morbid obesity  INTERVENTION:    Initiate TF via OGT with Vital High Protein at goal rate of 70 ml/h (1680 ml per day) and Prostat 30 ml QID to provide 2080 kcals, 207 gm protein, 1404 ml free water daily.  NUTRITION DIAGNOSIS:   Inadequate oral intake related to inability to eat as evidenced by NPO status.  GOAL:   Provide needs based on ASPEN/SCCM guidelines  MONITOR:   Vent status, Labs, TF tolerance, I & O's  REASON FOR ASSESSMENT:   Rounds, Consult Enteral/tube feeding initiation and management  ASSESSMENT:   65 yo male brought to ER 12/17 with generalized weakness. ABG showed hypercapnia. He was tried on Bipap w/o significant improvement. CXR showed pulmonary edema. Required intubation in ER.  Discussed patient in ICU rounds and with RN today. RD to order TF. Currently on CRRT. Nutrition-Focused physical exam completed. Findings are no fat depletion, no muscle depletion, and mild edema.  Patient is currently intubated on ventilator support Temp (24hrs), Avg:95.9 F (35.5 C), Min:90.2 F (32.3 C), Max:98.5 F (36.9 C)   Diet Order:  Diet NPO time specified  Skin:  Reviewed, no issues  Last BM:  unknown  Height:   Ht Readings from Last 1 Encounters:  01/17/2016 6\' 1"  (1.854 m)    Weight:   Wt Readings from Last 1 Encounters:  01/25/16 (!) 341 lb 11.4 oz (155 kg)    Ideal Body Weight:  83.6 kg  BMI:  Body mass index is 45.08 kg/m.  Estimated Nutritional Needs:   Kcal:  [id number]  Protein:  209 gm  Fluid:  2.2 L  EDUCATION NEEDS:   No education needs identified at this time  Molli Barrows, [place], [place], [place] Pager [phone] After Hours Pager [phone]

## 2016-01-25 NOTE — Progress Notes (Signed)
Patient tried to self extubate while being bathed.  Took 5 staff members to keep patient safe and in the bed.  Patient blood pressure drops when given Versed 5mg , which was not titratable per order.  Patient's blood pressure takes more than an hour to rebound after being given any amount of Versed. Requested titratable rate at start of shift but was not changed.  Also requested blood pressure support medication and sedation drip, but not addressed.  Will continue to monitor patient.

## 2016-01-25 NOTE — Progress Notes (Signed)
[facility] ROUNDING NOTE   Subjective:   Interval History: intubated now on fentanyl  Has been awake    Volume excess    Objective:  Vital signs in last 24 hours:  Temp:  [90.2 F (32.3 C)-98.5 F (36.9 C)] 97.1 F (36.2 C) (12/18 1216) Pulse Rate:  [51-98] 80 (12/18 1230) Resp:  [13-26] 17 (12/18 1230) BP: (62-178)/(41-105) 102/66 (12/18 1230) SpO2:  [82 %-100 %] 97 % (12/18 1230) FiO2 (%):  [60 %-100 %] 70 % (12/18 1200) Weight:  [154.6 kg (340 lb 13.3 oz)-155 kg (341 lb 11.4 oz)] 155 kg (341 lb 11.4 oz) (12/18 0500)  Weight change:  Filed Weights   01/13/2016 1929 01/25/16 0500  Weight: (!) 154.6 kg (340 lb 13.3 oz) (!) 155 kg (341 lb 11.4 oz)    Intake/Output: I/O last 3 completed shifts: In: 527.5 [I.V.:477.5; IV Piggyback:50] Out: 1083 [Urine:725; Other:358]   Intake/Output this shift:  Total I/O In: 227.5 [I.V.:117.5; NG/GT:60; IV Piggyback:50] Out: 282 [Urine:24; Other:258]  Gen intubated, unresponsive, large AAM  BP 100/70 No rash, cyanosis or gangrene Sclera anicteric, throat w ETT  No jvd or bruits Chest coarse rhonchi w scattered wheezing bilat RRR no MRG, distant HS Abd soft ntnd no mass or ascites +bs marked abd obesity GU normal male MS no joint effusions or deformity Ext 2+ diffuse LE edema / no wounds or ulcers LUA AVF no bruit Neuro is unresponsive on the vent   Basic Metabolic Panel:  Recent Labs Lab 01/17/2016 1056 01/25/16 0400  NA 144 142  K 5.4* 4.5  CL 111 108  CO2 24 24  GLUCOSE 111* 152*  BUN 56* 43*  CREATININE 6.34* 4.70*  CALCIUM 9.6 8.8*  MG  --  2.1  PHOS  --  5.1*    Liver Function Tests:  Recent Labs Lab 01/26/2016 1056 01/25/16 0400  AST 35 34  ALT 47 44  ALKPHOS 103 88  BILITOT 0.4 0.4  PROT 6.9 5.8*  ALBUMIN 3.3* 2.7*    Recent Labs Lab 02/01/2016 1056  LIPASE 49   No results for input(s): AMMONIA in the last 168 hours.  CBC:  Recent Labs Lab 02/05/2016 1056 01/25/2016 1907  01/25/16 0400  WBC 7.6 4.7 6.1  NEUTROABS 6.1  --   --   HGB 9.7* 8.8* 8.0*  HCT 32.8* 28.9* 27.1*  MCV 96.8 93.2 94.4  PLT 181 227 147*    Cardiac Enzymes: No results for input(s): CKTOTAL, CKMB, CKMBINDEX, TROPONINI in the last 168 hours.  BNP: Invalid input(s): POCBNP  CBG:  Recent Labs Lab 01/12/2016 2336 01/25/16 0018 01/25/16 0333 01/25/16 0825 01/25/16 1221  GLUCAP 65 118* 76 32 90    Microbiology: Results for orders placed or performed during the hospital encounter of 01/25/2016  MRSA PCR Screening     Status: None   Collection Time: 02/02/2016  9:19 PM  Result Value Ref Range Status   MRSA by PCR NEGATIVE NEGATIVE Final    Comment:        The GeneXpert MRSA Assay (FDA approved for NASAL specimens only), is one component of a comprehensive MRSA colonization surveillance program. It is not intended to diagnose MRSA infection nor to guide or monitor treatment for MRSA infections.     Coagulation Studies: No results for input(s): LABPROT, INR in the last 72 hours.  Urinalysis:  Recent Labs  01/12/2016 1928  COLORURINE YELLOW  LABSPEC 1.013  PHURINE 5.0  GLUCOSEU 150*  HGBUR SMALL*  BILIRUBINUR NEGATIVE  KETONESUR NEGATIVE  PROTEINUR >=300*  NITRITE NEGATIVE  LEUKOCYTESUR NEGATIVE      Imaging: Ct Head Wo Contrast  Result Date: 02/03/2016 CLINICAL DATA:  Generalized weakness and lethargy EXAM: CT HEAD WITHOUT CONTRAST TECHNIQUE: Contiguous axial images were obtained from the base of the skull through the vertex without intravenous contrast. COMPARISON:  August 24, 2015 FINDINGS: Brain: No subdural, epidural, or subarachnoid hemorrhage. No acute cortical ischemia or infarct. The cerebellum, brainstem, and basal cisterns are normal. No mass, mass effect, or midline shift. Ventricles and sulci are unremarkable. Vascular: Calcified atherosclerosis in the intracranial carotid arteries. Skull: Normal. Negative for fracture or focal lesion. Sinuses/Orbits:  Mucous retention cyst in the right maxillary sinus. Sinuses, mastoid air cells, and middle ears otherwise normal. Other: None. IMPRESSION: 1. No acute intracranial process. Electronically Signed   By: Dorise Bullion III M.D   On: 01/08/2016 16:14   Dg Chest Port 1 View  Result Date: 01/25/2016 CLINICAL DATA:  Acute respiratory failure EXAM: PORTABLE CHEST 1 VIEW COMPARISON:  02/02/2016 FINDINGS: Cardiac shadow is stable. Endotracheal tube is again seen in satisfactory position. Nasogastric catheter courses towards the stomach. Bibasilar changes are again seen without significant improvement. No bony abnormality is seen. IMPRESSION: Stable appearance of the chest from the previous day. Electronically Signed   By: Inez Catalina M.D.   On: 01/25/2016 07:27   Dg Chest Port 1 View  Result Date: 02/02/2016 CLINICAL DATA:  Encounter for central line placement. No repeat necessary per MD. EXAM: PORTABLE CHEST 1 VIEW COMPARISON:  01/20/2016 FINDINGS: Endotracheal tube is in place with tip 5.5 cm above the carina. A nasogastric tube is in place, tip beyond the gastroesophageal junction off the image. No central lines are identified. Heart size is enlarged. There are bilateral pleural effusions. Bibasilar opacities have increased consistent with edema and/or infectious process. No pneumothorax. IMPRESSION: 1. Endotracheal tube and nasogastric tube. 2. No central line identified despite history. 3. Increased bibasilar opacities consistent with atelectasis and infiltrates. Electronically Signed   By: Nolon Nations M.D.   On: 01/19/2016 18:41   Dg Chest Portable 1 View  Result Date: 01/21/2016 CLINICAL DATA:  ET tube placement EXAM: PORTABLE CHEST 1 VIEW COMPARISON:  01/31/2016 FINDINGS: Endotracheal tube is 5 cm above the carina. NG tube is in place with the tip in the stomach. Cardiomegaly. Diffuse bilateral airspace disease, likely edema, not significantly changed. IMPRESSION: ET tube 5 cm above the carina.  Diffuse bilateral airspace disease compatible with edema/ CHF. Electronically Signed   By: Rolm Baptise M.D.   On: 01/23/2016 15:05   Dg Chest Portable 1 View  Result Date: 01/27/2016 CLINICAL DATA:  Hypoxia.  Altered mental status EXAM: PORTABLE CHEST 1 VIEW COMPARISON:  08/29/2015 FINDINGS: Cardiomegaly. Diffuse bilateral airspace opacities compatible with moderate to severe edema/ CHF. Possible small layering effusions. IMPRESSION: Moderate to severe CHF changes. Electronically Signed   By: Rolm Baptise M.D.   On: 01/21/2016 11:26     Medications:   . sodium chloride 30 mL/hr at 01/25/16 0600  . feeding supplement (VITAL HIGH PROTEIN)    . fentaNYL infusion INTRAVENOUS 100 mcg/hr (01/25/16 1214)  . dialysis replacement fluid (prismasate) 400 mL/hr at 01/25/16 0927  . dialysis replacement fluid (prismasate) 200 mL/hr at 02/02/2016 2053  . dialysate (PRISMASATE) 2,000 mL/hr at 01/25/16 1214   . chlorhexidine gluconate (MEDLINE KIT)  15 mL Mouth Rinse BID  . [START ON 01/26/2016] famotidine (PEPCID) IV  20 mg Intravenous Q24H  . feeding supplement (PRO-STAT  SUGAR FREE 64)  30 mL Per Tube QID  . hydrocortisone sod succinate (SOLU-CORTEF) inj  50 mg Intravenous Q6H  . insulin aspart  0-20 Units Subcutaneous Q4H  . mouth rinse  15 mL Mouth Rinse QID   alteplase, fentaNYL, fentaNYL (SUBLIMAZE) injection, heparin, midazolam, sodium chloride  Assessment/ Plan:  The patient is a 65 y.o. year-old with history of HTN, DM, gout and CKD dating back to 2009 with records here. Was last here in July 2017 with leg weakness and parox afib, had creat 8 and AVF x 31mo, did not require HD.   1. Renal failure stage 5 - creat 6, was 8 in July. Hx bx-proven FSGS. Has AVF L arm no bruit.  2. Severe acidemia   improved 3. Hypothermia prob sepsis 4. Pulm - diffuse infiltrates, ARDS vs other 5. LE edema  Continue volume removal 100 an hour  6. Hypotension 7. DM2   LOS: 1 Gaylyn Berish W _0 _1 :00 PM

## 2016-01-25 NOTE — Progress Notes (Signed)
  Echocardiogram 2D Echocardiogram has been performed.  Johny Chess 01/25/2016, 10:06 AM

## 2016-01-26 ENCOUNTER — Inpatient Hospital Stay (HOSPITAL_COMMUNITY): Payer: Medicare Other

## 2016-01-26 DIAGNOSIS — R401 Stupor: Secondary | ICD-10-CM

## 2016-01-26 LAB — RENAL FUNCTION PANEL
ALBUMIN: 2.3 g/dL — AB (ref 3.5–5.0)
ANION GAP: 7 (ref 5–15)
Albumin: 2.6 g/dL — ABNORMAL LOW (ref 3.5–5.0)
Anion gap: 9 (ref 5–15)
BUN: 31 mg/dL — ABNORMAL HIGH (ref 6–20)
BUN: 28 mg/dL — ABNORMAL HIGH (ref 6–20)
CALCIUM: 8.6 mg/dL — AB (ref 8.9–10.3)
CHLORIDE: 104 mmol/L (ref 101–111)
CO2: 26 mmol/L (ref 22–32)
CO2: 24 mmol/L (ref 22–32)
CREATININE: 2.81 mg/dL — AB (ref 0.61–1.24)
Calcium: 8.9 mg/dL (ref 8.9–10.3)
Chloride: 107 mmol/L (ref 101–111)
Creatinine, Ser: 2.31 mg/dL — ABNORMAL HIGH (ref 0.61–1.24)
GFR calc non Af Amer: 22 mL/min — ABNORMAL LOW (ref >60)
GFR, EST AFRICAN AMERICAN: 25 mL/min — AB (ref >60)
GFR, EST AFRICAN AMERICAN: 32 mL/min — AB (ref >60)
GFR, EST NON AFRICAN AMERICAN: 28 mL/min — AB (ref >60)
Glucose, Bld: 124 mg/dL — ABNORMAL HIGH (ref 65–99)
Glucose, Bld: 121 mg/dL — ABNORMAL HIGH (ref 65–99)
PHOSPHORUS: 3.2 mg/dL (ref 2.5–4.6)
POTASSIUM: 4.5 mmol/L (ref 3.5–5.1)
POTASSIUM: 4.2 mmol/L (ref 3.5–5.1)
Phosphorus: 3.8 mg/dL (ref 2.5–4.6)
SODIUM: 138 mmol/L (ref 135–145)
Sodium: 139 mmol/L (ref 135–145)

## 2016-01-26 LAB — GLUCOSE, CAPILLARY
GLUCOSE-CAPILLARY: 113 mg/dL — AB (ref 65–99)
GLUCOSE-CAPILLARY: 115 mg/dL — AB (ref 65–99)
GLUCOSE-CAPILLARY: 130 mg/dL — AB (ref 65–99)
GLUCOSE-CAPILLARY: 134 mg/dL — AB (ref 65–99)
GLUCOSE-CAPILLARY: 139 mg/dL — AB (ref 65–99)
Glucose-Capillary: 108 mg/dL — ABNORMAL HIGH (ref 65–99)

## 2016-01-26 LAB — CBC
HEMATOCRIT: 26.6 % — AB (ref 39.0–52.0)
HEMOGLOBIN: 8.2 g/dL — AB (ref 13.0–17.0)
MCH: 28.1 pg (ref 26.0–34.0)
MCHC: 30.8 g/dL (ref 30.0–36.0)
MCV: 91.1 fL (ref 78.0–100.0)
Platelets: 173 10*3/uL (ref 150–400)
RBC: 2.92 MIL/uL — AB (ref 4.22–5.81)
RDW: 18.7 % — ABNORMAL HIGH (ref 11.5–15.5)
WBC: 8.4 10*3/uL (ref 4.0–10.5)

## 2016-01-26 LAB — MAGNESIUM: Magnesium: 2.5 mg/dL — ABNORMAL HIGH (ref 1.7–2.4)

## 2016-01-26 LAB — APTT: APTT: 36 s (ref 24–36)

## 2016-01-26 MED ORDER — DEXTROSE 5 % IV SOLN
2.0000 ug/min | INTRAVENOUS | Status: DC
  Administered 2016-01-26: 5 ug/min via INTRAVENOUS
  Administered 2016-01-27 (×2): 8 ug/min via INTRAVENOUS
  Administered 2016-01-28: 4 ug/min via INTRAVENOUS
  Administered 2016-01-28: 5 ug/min via INTRAVENOUS
  Administered 2016-01-29: 4 ug/min via INTRAVENOUS
  Administered 2016-01-30: 12 ug/min via INTRAVENOUS
  Administered 2016-01-30: 6 ug/min via INTRAVENOUS
  Administered 2016-01-30: 12 ug/min via INTRAVENOUS
  Administered 2016-01-30: 10 ug/min via INTRAVENOUS
  Filled 2016-01-26 (×11): qty 4

## 2016-01-26 MED ORDER — SODIUM CHLORIDE 0.9 % IV SOLN
0.0000 mg/h | INTRAVENOUS | Status: DC
  Administered 2016-01-26 (×2): 5 mg/h via INTRAVENOUS
  Administered 2016-01-27: 2 mg/h via INTRAVENOUS
  Administered 2016-01-27 – 2016-01-30 (×6): 4 mg/h via INTRAVENOUS
  Administered 2016-01-31 – 2016-02-01 (×2): 2 mg/h via INTRAVENOUS
  Administered 2016-02-02: 6 mg/h via INTRAVENOUS
  Administered 2016-02-02: 4 mg/h via INTRAVENOUS
  Administered 2016-02-02 – 2016-02-04 (×4): 6 mg/h via INTRAVENOUS
  Administered 2016-02-04: 5 mg/h via INTRAVENOUS
  Administered 2016-02-04: 7 mg/h via INTRAVENOUS
  Administered 2016-02-05: 8 mg/h via INTRAVENOUS
  Administered 2016-02-05: 6 mg/h via INTRAVENOUS
  Administered 2016-02-06 (×4): 8 mg/h via INTRAVENOUS
  Filled 2016-01-26 (×27): qty 10

## 2016-01-26 MED ORDER — ETOMIDATE 2 MG/ML IV SOLN
INTRAVENOUS | Status: AC
  Filled 2016-01-26: qty 10

## 2016-01-26 MED ORDER — MIDAZOLAM BOLUS VIA INFUSION
1.0000 mg | INTRAVENOUS | Status: DC | PRN
  Administered 2016-01-27 – 2016-02-06 (×8): 2 mg via INTRAVENOUS
  Filled 2016-01-26: qty 2

## 2016-01-26 NOTE — Progress Notes (Signed)
[facility] ROUNDING NOTE   Subjective:   Interval History:  Restless night last night  Very combative   Objective:  Vital signs in last 24 hours:  Temp:  [94 F (34.4 C)-98.9 F (37.2 C)] 94 F (34.4 C) (12/19 0430) Pulse Rate:  [61-89] 66 (12/19 0700) Resp:  [16-23] 20 (12/19 0700) BP: (74-132)/(56-88) 99/70 (12/19 0700) SpO2:  [94 %-100 %] 99 % (12/19 0700) FiO2 (%):  [50 %-70 %] 50 % (12/19 0346) Weight:  [154.2 kg (339 lb 15.2 oz)] 154.2 kg (339 lb 15.2 oz) (12/19 0451)  Weight change: -0.4 kg (-14.1 oz) Filed Weights   01/29/2016 1929 01/25/16 0500 01/26/16 0451  Weight: (!) 154.6 kg (340 lb 13.3 oz) (!) 155 kg (341 lb 11.4 oz) (!) 154.2 kg (339 lb 15.2 oz)    Intake/Output: I/O last 3 completed shifts: In: 2672.5 [I.V.:1117.5; NG/GT:1455; IV [id number]:009] Out: 2330 [Urine:299; Other:4150]   Intake/Output this shift:  No intake/output data recorded.  Gen intubated, unresponsive, large AAM BP 100/70 No rash, cyanosis or gangrene Sclera anicteric, throat w ETT No jvd or bruits Chest coarse rhonchi w scattered wheezing bilat RRR no MRG, distant HS Abd soft ntnd no mass or ascites +bs marked abd obesity GU normal male MS no joint effusions or deformity Ext 2+ diffuse LEedema / no wounds or ulcers LUA AVF no bruit Neuro is unresponsive on the vent   Basic Metabolic Panel:  Recent Labs Lab 01/30/2016 1056 01/25/16 0400 01/25/16 1530 01/26/16 0500  NA 144 142 142 139  K 5.4* 4.5 4.5 4.5  CL 111 108 109 104  CO2 '24 24 24 26  '$ GLUCOSE 111* 152* 116* 124*  BUN 56* 43* 33* 31*  CREATININE 6.34* 4.70* 4.00* 2.81*  CALCIUM 9.6 8.8* 9.1 8.9  MG  --  2.1  --  2.5*  PHOS  --  5.1* 4.6 3.8    Liver Function Tests:  Recent Labs Lab 01/20/2016 1056 01/25/16 0400 01/25/16 1530 01/26/16 0500  AST 35 34  --   --   ALT 47 44  --   --   ALKPHOS 103 88  --   --   BILITOT 0.4 0.4  --   --   PROT 6.9 5.8*  --   --   ALBUMIN 3.3* 2.7* 2.8* 2.6*     Recent Labs Lab 01/27/2016 1056  LIPASE 49   No results for input(s): AMMONIA in the last 168 hours.  CBC:  Recent Labs Lab 02/03/2016 1056 01/25/2016 1907 01/25/16 0400 01/26/16 0500  WBC 7.6 4.7 6.1 8.4  NEUTROABS 6.1  --   --   --   HGB 9.7* 8.8* 8.0* 8.2*  HCT 32.8* 28.9* 27.1* 26.6*  MCV 96.8 93.2 94.4 91.1  PLT 181 227 147* 173    Cardiac Enzymes: No results for input(s): CKTOTAL, CKMB, CKMBINDEX, TROPONINI in the last 168 hours.  BNP: Invalid input(s): POCBNP  CBG:  Recent Labs Lab 01/25/16 1221 01/25/16 1611 01/25/16 2026 01/26/16 0011 01/26/16 0353  GLUCAP 90 98 117* 113* 130*    Microbiology: Results for orders placed or performed during the hospital encounter of 01/09/2016  Culture, blood (routine x 2)     Status: None (Preliminary result)   Collection Time: 02/06/2016  7:05 PM  Result Value Ref Range Status   Specimen Description BLOOD RIGHT HAND  Final   Special Requests IN PEDIATRIC BOTTLE [place]  Final   Culture NO GROWTH < 24 HOURS  Final   Report  Status PENDING  Incomplete  Culture, blood (routine x 2)     Status: None (Preliminary result)   Collection Time: 01/28/2016  7:10 PM  Result Value Ref Range Status   Specimen Description BLOOD LEFT HAND  Final   Special Requests IN PEDIATRIC BOTTLE 1CC  Final   Culture NO GROWTH < 24 HOURS  Final   Report Status PENDING  Incomplete  MRSA PCR Screening     Status: None   Collection Time: 02/01/2016  9:19 PM  Result Value Ref Range Status   MRSA by PCR NEGATIVE NEGATIVE Final    Comment:        The GeneXpert MRSA Assay (FDA approved for NASAL specimens only), is one component of a comprehensive MRSA colonization surveillance program. It is not intended to diagnose MRSA infection nor to guide or monitor treatment for MRSA infections.     Coagulation Studies: No results for input(s): LABPROT, INR in the last 72 hours.  Urinalysis:  Recent Labs  01/18/2016 1928  COLORURINE YELLOW  LABSPEC  1.013  PHURINE 5.0  GLUCOSEU 150*  HGBUR SMALL*  BILIRUBINUR NEGATIVE  KETONESUR NEGATIVE  PROTEINUR >=300*  NITRITE NEGATIVE  LEUKOCYTESUR NEGATIVE      Imaging: Ct Head Wo Contrast  Result Date: 01/08/2016 CLINICAL DATA:  Generalized weakness and lethargy EXAM: CT HEAD WITHOUT CONTRAST TECHNIQUE: Contiguous axial images were obtained from the base of the skull through the vertex without intravenous contrast. COMPARISON:  August 24, 2015 FINDINGS: Brain: No subdural, epidural, or subarachnoid hemorrhage. No acute cortical ischemia or infarct. The cerebellum, brainstem, and basal cisterns are normal. No mass, mass effect, or midline shift. Ventricles and sulci are unremarkable. Vascular: Calcified atherosclerosis in the intracranial carotid arteries. Skull: Normal. Negative for fracture or focal lesion. Sinuses/Orbits: Mucous retention cyst in the right maxillary sinus. Sinuses, mastoid air cells, and middle ears otherwise normal. Other: None. IMPRESSION: 1. No acute intracranial process. Electronically Signed   By: Dorise Bullion III M.D   On: 01/25/2016 16:14   Dg Chest Port 1 View  Result Date: 01/25/2016 CLINICAL DATA:  Acute respiratory failure EXAM: PORTABLE CHEST 1 VIEW COMPARISON:  02/03/2016 FINDINGS: Cardiac shadow is stable. Endotracheal tube is again seen in satisfactory position. Nasogastric catheter courses towards the stomach. Bibasilar changes are again seen without significant improvement. No bony abnormality is seen. IMPRESSION: Stable appearance of the chest from the previous day. Electronically Signed   By: Inez Catalina M.D.   On: 01/25/2016 07:27   Dg Chest Port 1 View  Result Date: 01/23/2016 CLINICAL DATA:  Encounter for central line placement. No repeat necessary per MD. EXAM: PORTABLE CHEST 1 VIEW COMPARISON:  01/19/2016 FINDINGS: Endotracheal tube is in place with tip 5.5 cm above the carina. A nasogastric tube is in place, tip beyond the gastroesophageal junction  off the image. No central lines are identified. Heart size is enlarged. There are bilateral pleural effusions. Bibasilar opacities have increased consistent with edema and/or infectious process. No pneumothorax. IMPRESSION: 1. Endotracheal tube and nasogastric tube. 2. No central line identified despite history. 3. Increased bibasilar opacities consistent with atelectasis and infiltrates. Electronically Signed   By: Nolon Nations M.D.   On: 01/30/2016 18:41   Dg Chest Portable 1 View  Result Date: 01/21/2016 CLINICAL DATA:  ET tube placement EXAM: PORTABLE CHEST 1 VIEW COMPARISON:  01/23/2016 FINDINGS: Endotracheal tube is 5 cm above the carina. NG tube is in place with the tip in the stomach. Cardiomegaly. Diffuse bilateral airspace disease, likely edema,  not significantly changed. IMPRESSION: ET tube 5 cm above the carina. Diffuse bilateral airspace disease compatible with edema/ CHF. Electronically Signed   By: Rolm Baptise M.D.   On: 01/20/2016 15:05   Dg Chest Portable 1 View  Result Date: 02/07/2016 CLINICAL DATA:  Hypoxia.  Altered mental status EXAM: PORTABLE CHEST 1 VIEW COMPARISON:  08/29/2015 FINDINGS: Cardiomegaly. Diffuse bilateral airspace opacities compatible with moderate to severe edema/ CHF. Possible small layering effusions. IMPRESSION: Moderate to severe CHF changes. Electronically Signed   By: Rolm Baptise M.D.   On: 02/07/2016 11:26     Medications:   . sodium chloride 10 mL/hr at 01/26/16 0600  . sodium chloride 10 mL/hr at 01/25/16 1744  . feeding supplement (VITAL HIGH PROTEIN) 1,000 mL (01/26/16 0641)  . fentaNYL infusion INTRAVENOUS 200 mcg/hr (01/25/16 2351)  . dialysis replacement fluid (prismasate) 400 mL/hr at 01/25/16 2257  . dialysis replacement fluid (prismasate) 200 mL/hr at 01/25/16 2257  . dialysate (PRISMASATE) 2,000 mL/hr at 01/26/16 0340   . chlorhexidine gluconate (MEDLINE KIT)  15 mL Mouth Rinse BID  . famotidine (PEPCID) IV  20 mg Intravenous  Q24H  . feeding supplement (PRO-STAT SUGAR FREE 64)  30 mL Per Tube QID  . hydrocortisone sod succinate (SOLU-CORTEF) inj  50 mg Intravenous Q6H  . insulin aspart  0-20 Units Subcutaneous Q4H  . mouth rinse  15 mL Mouth Rinse QID   alteplase, fentaNYL, fentaNYL (SUBLIMAZE) injection, heparin, midazolam, sodium chloride  Assessment/ Plan:  The patient is a 65 y.o.year-old with history of HTN, DM, gout and CKD dating back to 2009 with records here. Was last here in July 2017 with leg weakness and parox afib, had creat 8 and AVF x 91mo, did not require HD.  1. Renal failure stage 5 - creat 6, was 8 in July. Hx bx-proven FSGS. Has AVF L arm no bruit.  2. Severe acidemia   improved 3. Hypothermia prob sepsis 4. Pulm - diffuse infiltrates, ARDS vs other 5. LE edema  Continue volume removal 100 an hour  6. Hypotension 7. DM2 No changes today in the dialysis prescription   LOS: 2 B[place] Lyn W '@TODAY''@7'$ :15 AM

## 2016-01-26 NOTE — Care Management Note (Signed)
Case Management Note  Patient Details  Name: Miguel [name] MRN: [id number] Date of Birth: 04/19/1950  Pt admitted with acute on chronic resp distress                    Action/Plan:  No family at bedside.  Pt is on the ventilator and CRRT.  CM will continue to follow for discharge needs   Expected Discharge Date:   (unknown)               Expected Discharge Plan:     In-House Referral:     Discharge planning Services  CM Consult  Post Acute Care Choice:    Choice offered to:     DME Arranged:    DME Agency:     HH Arranged:    HH Agency:     Status of Service:  In process, will continue to follow  If discussed at Long Length of Stay Meetings, dates discussed:    Additional Comments:  Maryclare Labrador, RN 01/26/2016, 3:56 PM

## 2016-01-26 NOTE — Progress Notes (Signed)
PCCM Admission Note  Admission date: 01/16/2016 Referring provider: Dr. Sherry Ruffing, ER  CC: feels weak  HPI: Hx from chart.  65 yo male brought to ER 12/17 with generalized weakness.  He had SpO2 78%.  ABG showed hypercapnia.  He was tried on Bipap w/o significant improvement.  He has CKD 5 and CXR showed pulmonary edema.  He had temperature 65F.  He was intubated in ER.  Subjective/Interval Hx:  Remains on CVVHD.  BP soft but slightly improved, not on pressors.   Continued agitation this am, versed gtt added.   Vital signs: BP (!) 86/60   Pulse 65   Temp (!) 93 F (33.9 C) (Axillary)   Resp 20   Ht 6\' 1"  (1.854 m)   Wt (!) 339 lb 15.2 oz (154.2 kg)   SpO2 96%   BMI 44.85 kg/m   Intake/output: I/O last 3 completed shifts: In: 2782.5 [I.V.:1157.5; NG/GT:1525; IV Piggyback:100] Out: [id number] [Urine:319; Other:4350]  General: sedated, NAD on vent, CVVHD, needs new access. Neuro: significant agitation,MAE x 4, versed gtt started HEENT: ETT Cardiac: regular, no murmur Chest: resps even non labored on vent, coarse, bibasilar crackles Abd: obese, soft, non tender, decreased bowel sounds Ext: 1+ edema, AVF L arm - bruitt Skin: venous stasis changes  CMP Latest Ref Rng & Units 01/26/2016 01/25/2016 01/25/2016  Glucose 65 - 99 mg/dL 124(H) 116(H) 152(H)  BUN 6 - 20 mg/dL 31(H) 33(H) 43(H)  Creatinine 0.61 - 1.24 mg/dL 2.81(H) 4.00(H) 4.70(H)  Sodium 135 - 145 mmol/L 139 142 142  Potassium 3.5 - 5.1 mmol/L 4.5 4.5 4.5  Chloride 101 - 111 mmol/L 104 109 108  CO2 22 - 32 mmol/L 26 24 24   Calcium 8.9 - 10.3 mg/dL 8.9 9.1 8.8(L)  Total Protein 6.5 - 8.1 g/dL - - 5.8(L)  Total Bilirubin 0.3 - 1.2 mg/dL - - 0.4  Alkaline Phos 38 - 126 U/L - - 88  AST 15 - 41 U/L - - 34  ALT 17 - 63 U/L - - 44    CBC Latest Ref Rng & Units 01/26/2016 01/25/2016 01/31/2016  WBC 4.0 - 10.5 K/uL 8.4 6.1 4.7  Hemoglobin 13.0 - 17.0 g/dL 8.2(L) 8.0(L) 8.8(L)  Hematocrit 39.0 - 52.0 % 26.6(L) 27.1(L)  28.9(L)  Platelets 150 - 400 K/uL 173 147(L) 227    ABG    Component Value Date/Time   PHART 7.328 (L) 01/25/2016 0345   PCO2ART 48.5 (H) 01/25/2016 0345   PO2ART 90.4 01/25/2016 0345   HCO3 24.7 01/25/2016 0345   TCO2 25 01/22/2016 1647   ACIDBASEDEF 0.5 01/25/2016 0345   O2SAT 95.5 01/25/2016 0345    CBG (last 3)   Recent Labs  01/26/16 0011 01/26/16 0353 01/26/16 0840  GLUCAP 113* 130* 108*     Imaging: Ct Head Wo Contrast  Result Date: 02/05/2016 CLINICAL DATA:  Generalized weakness and lethargy EXAM: CT HEAD WITHOUT CONTRAST TECHNIQUE: Contiguous axial images were obtained from the base of the skull through the vertex without intravenous contrast. COMPARISON:  August 24, 2015 FINDINGS: Brain: No subdural, epidural, or subarachnoid hemorrhage. No acute cortical ischemia or infarct. The cerebellum, brainstem, and basal cisterns are normal. No mass, mass effect, or midline shift. Ventricles and sulci are unremarkable. Vascular: Calcified atherosclerosis in the intracranial carotid arteries. Skull: Normal. Negative for fracture or focal lesion. Sinuses/Orbits: Mucous retention cyst in the right maxillary sinus. Sinuses, mastoid air cells, and middle ears otherwise normal. Other: None. IMPRESSION: 1. No acute intracranial process. Electronically Signed  By: Dorise Bullion III M.D   On: 01/28/2016 16:14   Dg Chest Port 1 View  Result Date: 01/25/2016 CLINICAL DATA:  Acute respiratory failure EXAM: PORTABLE CHEST 1 VIEW COMPARISON:  01/13/2016 FINDINGS: Cardiac shadow is stable. Endotracheal tube is again seen in satisfactory position. Nasogastric catheter courses towards the stomach. Bibasilar changes are again seen without significant improvement. No bony abnormality is seen. IMPRESSION: Stable appearance of the chest from the previous day. Electronically Signed   By: Inez Catalina M.D.   On: 01/25/2016 07:27   Dg Chest Port 1 View  Result Date: 01/19/2016 CLINICAL DATA:   Encounter for central line placement. No repeat necessary per MD. EXAM: PORTABLE CHEST 1 VIEW COMPARISON:  01/27/2016 FINDINGS: Endotracheal tube is in place with tip 5.5 cm above the carina. A nasogastric tube is in place, tip beyond the gastroesophageal junction off the image. No central lines are identified. Heart size is enlarged. There are bilateral pleural effusions. Bibasilar opacities have increased consistent with edema and/or infectious process. No pneumothorax. IMPRESSION: 1. Endotracheal tube and nasogastric tube. 2. No central line identified despite history. 3. Increased bibasilar opacities consistent with atelectasis and infiltrates. Electronically Signed   By: Nolon Nations M.D.   On: 01/29/2016 18:41   Dg Chest Portable 1 View  Result Date: 01/16/2016 CLINICAL DATA:  ET tube placement EXAM: PORTABLE CHEST 1 VIEW COMPARISON:  01/23/2016 FINDINGS: Endotracheal tube is 5 cm above the carina. NG tube is in place with the tip in the stomach. Cardiomegaly. Diffuse bilateral airspace disease, likely edema, not significantly changed. IMPRESSION: ET tube 5 cm above the carina. Diffuse bilateral airspace disease compatible with edema/ CHF. Electronically Signed   By: Rolm Baptise M.D.   On: 01/20/2016 15:05   Dg Chest Portable 1 View  Result Date: 02/01/2016 CLINICAL DATA:  Hypoxia.  Altered mental status EXAM: PORTABLE CHEST 1 VIEW COMPARISON:  08/29/2015 FINDINGS: Cardiomegaly. Diffuse bilateral airspace opacities compatible with moderate to severe edema/ CHF. Possible small layering effusions. IMPRESSION: Moderate to severe CHF changes. Electronically Signed   By: Rolm Baptise M.D.   On: 01/17/2016 11:26    Studies: CT head 12/17 >>neg acute  Echo >> 01/26/2016>> EF [id number], Grade 1 diastolic dysfunction  Cultures: Blood 12/17 >> Sputum 12/17 >>  Antitibiotics:  Lines/tubes:  ETT 12/17 >> HD cath R Femoral >> 1219 Left IJ HD 12/19>>>  Events:  12/17 admit, renal  consulted   ASSESSMENT / PLAN:  PULMONARY Acute hypoxic/hypercapnic respiratory failure in setting of pulmonary edema. P:   Vent support - 8cc/kg  Wean FiO2 as able Wean PEEP as able F/u CXR 12/20 F/u ABG as needed Volume removal as able  Goal saturations > 92%  CARDIOVASCULAR Pulmonary edema  Hx of HTN, HLD. Hypothermia resolving EF>> 60-65% ( 12/18) Pressure soft, but no pressors P:  hold norvasc, lasix, crestor, toprol Echo as above TSH, UDS normal  Cortisol=9.8 Warming blanket  Continue stress steroids   RENAL CKD 5. Hyperkalemia. Access with poor flow Goal hourly net neg 100 cc / hr P:   F/u chem  Strict I&O Renal following  CVVHD ongoing  Will re-assess for new CVVHD access Titus Mould)  GASTROINTESTINAL No active issue  P:   PPI  NPO TF at goal   HEMATOLOGIC Anemia of chronic disease No active bleeding  P:  F/u CBC SCD's  Transfuse for HGB >7 Trend coags as needed  INFECTIOUS No active issue  P:   Trend wbc, fever curve  ENDOCRINE DM   P:   SSI   NEUROLOGIC Acute metabolic encephalopathy. CT head neg  P:   RASS goal 0 to -1 Continue to hold outpt neurontin  Will add fentanyl gtt  Daily WUA   No family available 12/19.    Magdalen Spatz, AGACNP-BC [place] Pager # [phone] 01/26/2016  10:25 AM  STAFF NOTE: Linwood Dibbles, MD FACP have personally reviewed patient's available data, including medical history, events of note, physical examination and test results as part of my evaluation. I have discussed with resident/NP and other care providers such as pharmacist, RN and RRT. In addition, I personally evaluated patient and elicited key findings of: awakens with agitation easily, BP wnl in am , crackles remain but he is successfully neg in balance and vent needs have improved, remains on peep 10-8., goal to 5, need to continue neg balance on cvvhd, appears that he needs low dose  pressors for BP support as we pull off fluids, happy to support levophed as we continued to pull fluid off , he is grossly overloaded still, keep same MV on vent, may need additional sedation, echo reassuring, appears we have to add versed in drip form, would like to limit this as able, continued feeds, place new neck HD cath line as fem out now, I updated daughter in full, also attempt PS 10, cpap 8 if able, and upright position The patient is critically ill with multiple organ systems failure and requires high complexity decision making for assessment and support, frequent evaluation and titration of therapies, application of advanced monitoring technologies and extensive interpretation of multiple databases.   Critical Care Time devoted to patient care services described in this note is 35 Minutes. This time reflects time of care of this signee: Merrie Roof, MD FACP. This critical care time does not reflect procedure time, or teaching time or supervisory time of PA/NP/Med student/Med Resident etc but could involve care discussion time. Rest per NP/medical resident whose note is outlined above and that I agree with   Lavon Paganini. Titus Mould, MD, [place] Pgr: [facility] 01/26/2016 5:46 PM

## 2016-01-26 NOTE — Progress Notes (Signed)
eLink Physician-Brief Progress Note Patient Name: BAILEE MACKIN DOB: 12/01/1950 MRN: [id number]   Date of Service  01/26/2016  HPI/Events of Note  Pt on CRRT, with SBP in the 80s, has severe agitation, pulled out vascath earlier today, now with sedation at moderate doses  eICU Interventions  CXR reviewed, b/l interstital edema, goal is net neg 100. Start low dose levophed to maintain SBP during crrt.      Intervention Category Major Interventions: Hypotension - evaluation and management  Laxmi Choung 01/26/2016, 4:29 PM

## 2016-01-26 NOTE — Procedures (Signed)
Central Venous Catheter Insertion Procedure Note Saud Rode Quinteros [id number] [birth year]  Procedure: Insertion of Central Venous Catheter Indications: cvvhd  Procedure Details Consent: Risks of procedure as well as the alternatives and risks of each were explained to the (patient/caregiver).  Consent for procedure obtained. Time Out: Verified patient identification, verified procedure, site/side was marked, verified correct patient position, special equipment/implants available, medications/allergies/relevent history reviewed, required imaging and test results available.  Performed  Maximum sterile technique was used including antiseptics, cap, gloves, gown, hand hygiene, mask and sheet. Skin prep: Chlorhexidine; local anesthetic administered A antimicrobial bonded/coated triple lumen catheter was placed in the left internal jugular vein using the Seldinger technique.  Evaluation Blood flow good Complications: No apparent complications Patient did tolerate procedure well. Chest X-ray ordered to verify placement.  CXR: pending.  Raylene Miyamoto 01/26/2016, 12:24 PM  [place]  Daniel J. Titus Mould, MD, [place] Pgr: [facility]

## 2016-01-27 ENCOUNTER — Inpatient Hospital Stay (HOSPITAL_COMMUNITY): Payer: Medicare Other

## 2016-01-27 DIAGNOSIS — R400 Somnolence: Secondary | ICD-10-CM

## 2016-01-27 LAB — GLUCOSE, CAPILLARY
GLUCOSE-CAPILLARY: 109 mg/dL — AB (ref 65–99)
GLUCOSE-CAPILLARY: 165 mg/dL — AB (ref 65–99)
GLUCOSE-CAPILLARY: 126 mg/dL — AB (ref 65–99)
GLUCOSE-CAPILLARY: 146 mg/dL — AB (ref 65–99)
GLUCOSE-CAPILLARY: 124 mg/dL — AB (ref 65–99)
Glucose-Capillary: 104 mg/dL — ABNORMAL HIGH (ref 65–99)
Glucose-Capillary: 157 mg/dL — ABNORMAL HIGH (ref 65–99)

## 2016-01-27 LAB — CBC
HEMATOCRIT: 26.7 % — AB (ref 39.0–52.0)
Hemoglobin: 8.2 g/dL — ABNORMAL LOW (ref 13.0–17.0)
MCH: 28.1 pg (ref 26.0–34.0)
MCHC: 30.7 g/dL (ref 30.0–36.0)
MCV: 91.4 fL (ref 78.0–100.0)
PLATELETS: 143 10*3/uL — AB (ref 150–400)
RBC: 2.92 MIL/uL — ABNORMAL LOW (ref 4.22–5.81)
RDW: 18.4 % — AB (ref 11.5–15.5)
WBC: 9.3 10*3/uL (ref 4.0–10.5)

## 2016-01-27 LAB — PROTIME-INR
INR: 1.04
Prothrombin Time: 13.7 seconds (ref 11.4–15.2)

## 2016-01-27 LAB — CULTURE, RESPIRATORY W GRAM STAIN

## 2016-01-27 LAB — RENAL FUNCTION PANEL
ALBUMIN: 2.5 g/dL — AB (ref 3.5–5.0)
ANION GAP: 6 (ref 5–15)
Albumin: 2.6 g/dL — ABNORMAL LOW (ref 3.5–5.0)
Anion gap: 8 (ref 5–15)
BUN: 39 mg/dL — ABNORMAL HIGH (ref 6–20)
BUN: 35 mg/dL — AB (ref 6–20)
CHLORIDE: 104 mmol/L (ref 101–111)
CHLORIDE: 104 mmol/L (ref 101–111)
CO2: 27 mmol/L (ref 22–32)
CO2: 26 mmol/L (ref 22–32)
Calcium: 8.9 mg/dL (ref 8.9–10.3)
Calcium: 9 mg/dL (ref 8.9–10.3)
Creatinine, Ser: 3.1 mg/dL — ABNORMAL HIGH (ref 0.61–1.24)
Creatinine, Ser: 2.46 mg/dL — ABNORMAL HIGH (ref 0.61–1.24)
GFR calc Af Amer: 30 mL/min — ABNORMAL LOW (ref >60)
GFR calc non Af Amer: 26 mL/min — ABNORMAL LOW (ref >60)
GFR, EST AFRICAN AMERICAN: 22 mL/min — AB (ref >60)
GFR, EST NON AFRICAN AMERICAN: 19 mL/min — AB (ref >60)
GLUCOSE: 148 mg/dL — AB (ref 65–99)
Glucose, Bld: 125 mg/dL — ABNORMAL HIGH (ref 65–99)
PHOSPHORUS: 4 mg/dL (ref 2.5–4.6)
POTASSIUM: 4.4 mmol/L (ref 3.5–5.1)
Phosphorus: 3.8 mg/dL (ref 2.5–4.6)
Potassium: 4.6 mmol/L (ref 3.5–5.1)
Sodium: 137 mmol/L (ref 135–145)
Sodium: 138 mmol/L (ref 135–145)

## 2016-01-27 LAB — CULTURE, RESPIRATORY: CULTURE: NORMAL

## 2016-01-27 LAB — MAGNESIUM: MAGNESIUM: 2.5 mg/dL — AB (ref 1.7–2.4)

## 2016-01-27 LAB — APTT: APTT: 33 s (ref 24–36)

## 2016-01-27 MED ORDER — QUETIAPINE FUMARATE 25 MG PO TABS
25.0000 mg | ORAL_TABLET | Freq: Two times a day (BID) | ORAL | Status: DC
  Administered 2016-01-27 – 2016-01-29 (×5): 25 mg via ORAL
  Filled 2016-01-27 (×6): qty 1

## 2016-01-27 MED ORDER — CLONAZEPAM 0.5 MG PO TABS
0.5000 mg | ORAL_TABLET | Freq: Two times a day (BID) | ORAL | Status: DC
  Administered 2016-01-27 – 2016-01-28 (×3): 0.5 mg via ORAL
  Filled 2016-01-27 (×3): qty 1

## 2016-01-27 NOTE — Progress Notes (Signed)
PCCM Admission Note  Admission date: 01/23/2016 Referring provider: Dr. Sherry Ruffing, ER  CC: feels weak  HPI: Hx from chart.  65 yo male brought to ER 12/17 with generalized weakness.  He had SpO2 78%.  ABG showed hypercapnia.  He was tried on Bipap w/o significant improvement.  He has CKD 5 and CXR showed pulmonary edema.  He had temperature 57F.  He was intubated in ER.  Subjective/Interval Hx:  Severe agitation yesterday.  Pulled HD cath out.  Versed gtt added. On levophed 101mcg. Remains on CVVHD - pulling 133ml/hr   Vital signs: BP 111/65 (BP Location: Right Arm)   Pulse 66   Temp 97.1 F (36.2 C) (Oral)   Resp 20   Ht 6\' 1"  (1.854 m)   Wt (!) 153.9 kg (339 lb 4.6 oz)   SpO2 100%   BMI 44.76 kg/m   Intake/output: I/O last 3 completed shifts: In: 4179.2 [I.V.:1609.2; NG/GT:2520; IV Piggyback:50] Out: [id number] [Urine:195; Other:5884]  General: sedated, NAD on vent, CVVHD, needs new access. Neuro: RASS -3,  HEENT: ETT Cardiac: regular, no murmur Chest: resps even non labored on vent, coarse, few scattered crackles  Abd: obese, soft, non tender, decreased bowel sounds Ext: 1+ edema, AVF L arm - bruitt Skin: venous stasis changes  CMP Latest Ref Rng & Units 01/27/2016 01/26/2016 01/26/2016  Glucose 65 - 99 mg/dL 125(H) 121(H) 124(H)  BUN 6 - 20 mg/dL 39(H) 28(H) 31(H)  Creatinine 0.61 - 1.24 mg/dL 3.10(H) 2.31(H) 2.81(H)  Sodium 135 - 145 mmol/L 137 138 139  Potassium 3.5 - 5.1 mmol/L 4.6 4.2 4.5  Chloride 101 - 111 mmol/L 104 107 104  CO2 22 - 32 mmol/L 27 24 26   Calcium 8.9 - 10.3 mg/dL 8.9 8.6(L) 8.9  Total Protein 6.5 - 8.1 g/dL - - -  Total Bilirubin 0.3 - 1.2 mg/dL - - -  Alkaline Phos 38 - 126 U/L - - -  AST 15 - 41 U/L - - -  ALT 17 - 63 U/L - - -    CBC Latest Ref Rng & Units 01/27/2016 01/26/2016 01/25/2016  WBC 4.0 - 10.5 K/uL 9.3 8.4 6.1  Hemoglobin 13.0 - 17.0 g/dL 8.2(L) 8.2(L) 8.0(L)  Hematocrit 39.0 - 52.0 % 26.7(L) 26.6(L) 27.1(L)  Platelets 150 -  400 K/uL 143(L) 173 147(L)    ABG    Component Value Date/Time   PHART 7.328 (L) 01/25/2016 0345   PCO2ART 48.5 (H) 01/25/2016 0345   PO2ART 90.4 01/25/2016 0345   HCO3 24.7 01/25/2016 0345   TCO2 25 01/21/2016 1647   ACIDBASEDEF 0.5 01/25/2016 0345   O2SAT 95.5 01/25/2016 0345    CBG (last 3)   Recent Labs  01/27/16 0337 01/27/16 0823 01/27/16 1144  GLUCAP 109* 165* 126*     Imaging: Dg Chest Port 1 View  Result Date: 01/27/2016 CLINICAL DATA:  Hypoxia EXAM: PORTABLE CHEST 1 VIEW COMPARISON:  January 26, 2016 FINDINGS: Endotracheal tube tip is 4.9 cm above the carina. Nasogastric tube tip and side port are below the diaphragm. Central catheter tip is in the left innominate vein, stable. No pneumothorax. There is cardiomegaly with pulmonary venous hypertension. There is interstitial and patchy alveolar edema bilaterally. There are pleural effusions bilaterally. There is aortic atherosclerosis. IMPRESSION: Tube and catheter positions as described without pneumothorax. Evidence of congestive heart failure. Aortic atherosclerosis. Electronically Signed   By: Lowella Grip III M.D.   On: 01/27/2016 08:18   Dg Chest Port 1 View  Result Date: 01/26/2016 CLINICAL  DATA:  Encounter for central line placement. EXAM: PORTABLE CHEST 1 VIEW COMPARISON:  Radiograph January 25, 2016. FINDINGS: Stable cardiomegaly. Endotracheal and nasogastric tubes are unchanged in position. No pneumothorax is noted. Increased bibasilar opacities are noted concerning for worsening edema or atelectasis with associated pleural effusions. Bony thorax is unremarkable. IMPRESSION: Stable support apparatus. Increased bibasilar opacities are noted concerning for worsening edema or atelectasis with associated pleural effusions. Electronically Signed   By: Marijo Conception, M.D.   On: 01/26/2016 13:02    Studies: CT head 12/17 >>neg acute  Echo >> 01/13/2016>> EF [id number], Grade 1 diastolic  dysfunction  Cultures: Blood 12/17 >>ngtd>>> Sputum 12/17 >>normal flora   Antitibiotics:  Lines/tubes:  ETT 12/17 >> HD cath R Femoral >> 12/19 Left IJ HD 12/19>>>  Events:  12/17 admit, renal consulted   ASSESSMENT / PLAN:  PULMONARY Acute hypoxic/hypercapnic respiratory failure in setting of pulmonary edema. P:   Continue volume removal with CVVHD per renal  Vent support - 8cc/kg  Daily SBT  Wean FiO2 as able Continue peep 8  F/u CXR in am  Volume removal as able    CARDIOVASCULAR Pulmonary edema  Hx of HTN, HLD. Hypothermia resolving EF>> 60-65% ( 12/18) Hypotension - P:  Continue low dose levophed as needed to maintain MAP and allow volume removal  hold home norvasc, lasix, crestor, toprol Cortisol=9.8 - Continue stress steroids   RENAL CKD 5. Hyperkalemia. Access with poor flow Goal hourly net neg 100 cc / hr P:   F/u chem  Strict I&O Renal following  CVVHD ongoing  Pressor support as above to allow volume removal   GASTROINTESTINAL No active issue  P:   PPI  NPO TF at goal   HEMATOLOGIC Anemia of chronic disease No active bleeding  P:  F/u CBC SCD's  Transfuse for HGB >7 Trend coags as needed  INFECTIOUS No active issue  P:   Trend wbc, fever curve   ENDOCRINE DM   P:   SSI   NEUROLOGIC Acute metabolic encephalopathy - CT head neg  Agitation  P:   RASS goal 0 to -1 Continue to hold outpt neurontin  fentanyl gtt, versed gtt Daily WUA  Will add low dose enteral sedation (clonazepam 0.5BID and seroquel 25mg  BID) and attempt wean gtts   No family available 12/20.     Miguel Madrid, NP 01/27/2016  11:58 AM Pager: [phone] or [phone]  STAFF NOTE: I, Merrie Roof, MD FACP have personally reviewed patient's available data, including medical history, events of note, physical examination and test results as part of my evaluation. I have discussed with resident/NP and other care providers such as  pharmacist, RN and RRT. In addition, I personally evaluated patient and elicited key findings of: rass -2, NOT FC, crackles remains bases, reduced BS bases, pcxr about unchanged edema, was neg 760 cc, plan is support BP with levophed to map 55-60, Echo re assuring, will have neg balance continued at 100, follow pressor needs closely, WUA, peep remains at 8, goal to 5 today, oral agents added to reduce drips, add sereq, max this agent, if able to cpap 8ps 10 The patient is critically ill with multiple organ systems failure and requires high complexity decision making for assessment and support, frequent evaluation and titration of therapies, application of advanced monitoring technologies and extensive interpretation of multiple databases.   Critical Care Time devoted to patient care services described in this note is30 min  Minutes. This time reflects time of  care of this signee: Merrie Roof, MD FACP. This critical care time does not reflect procedure time, or teaching time or supervisory time of PA/NP/Med student/Med Resident etc but could involve care discussion time. Rest per NP/medical resident whose note is outlined above and that I agree with   Lavon Paganini. Titus Mould, MD, [place] Pgr: [facility] 01/27/2016 12:07 PM

## 2016-01-27 NOTE — Progress Notes (Signed)
[facility] ROUNDING NOTE   Subjective:   Interval History: still sedated this morning  Good fluid removal weight though unchanged since admission  Chest X Ray yesterday showed some increased inflitrates   Objective:  Vital signs in last 24 hours:  Temp:  [93.3 F (34.1 C)-98.8 F (37.1 C)] 97.8 F (36.6 C) (12/20 0827) Pulse Rate:  [52-86] 70 (12/20 0800) Resp:  [17-20] 20 (12/20 0800) BP: (66-140)/(48-75) 85/60 (12/20 0800) SpO2:  [88 %-100 %] 98 % (12/20 0800) FiO2 (%):  [40 %-50 %] 40 % (12/20 0800) Weight:  [153.9 kg (339 lb 4.6 oz)] 153.9 kg (339 lb 4.6 oz) (12/20 0421)  Weight change: -0.3 kg (-10.6 oz) Filed Weights   01/25/16 0500 01/26/16 0451 01/27/16 0421  Weight: (!) 155 kg (341 lb 11.4 oz) (!) 154.2 kg (339 lb 15.2 oz) (!) 153.9 kg (339 lb 4.6 oz)    Intake/Output: I/O last 3 completed shifts: In: 4179.2 [I.V.:1609.2; NG/GT:2520; IV Piggyback:50] Out: 6079 [Urine:195; Other:5884]   Intake/Output this shift:  Total I/O In: 117 [I.V.:47; NG/GT:70] Out: 175 [Other:175]  Gen intubated, unresponsive, large AAM BP 100/70 No rash, cyanosis or gangrene Sclera anicteric, throat w ETT No jvd or bruits Chest coarse rhonchi w scattered wheezing bilat RRR no MRG, distant HS Abd soft ntnd no mass or ascites +bs marked abd obesity GU normal male MS no joint effusions or deformity Ext 2+ diffuse LEedema / no wounds or ulcers LUA AVF no bruit Neuro is unresponsive on the vent   Basic Metabolic Panel:  Recent Labs Lab 01/25/16 0400 01/25/16 1530 01/26/16 0500 01/26/16 1600 01/27/16 0514  NA 142 142 139 138 137  K 4.5 4.5 4.5 4.2 4.6  CL 108 109 104 107 104  CO2 24 24 26 24 27  GLUCOSE 152* 116* 124* 121* 125*  BUN 43* 33* 31* 28* 39*  CREATININE 4.70* 4.00* 2.81* 2.31* 3.10*  CALCIUM 8.8* 9.1 8.9 8.6* 8.9  MG 2.1  --  2.5*  --  2.5*  PHOS 5.1* 4.6 3.8 3.2 4.0    Liver Function Tests:  Recent Labs Lab 01/10/2016 1056  01/25/16 0400 01/25/16 1530 01/26/16 0500 01/26/16 1600 01/27/16 0514  AST 35 34  --   --   --   --   ALT 47 44  --   --   --   --   ALKPHOS 103 88  --   --   --   --   BILITOT 0.4 0.4  --   --   --   --   PROT 6.9 5.8*  --   --   --   --   ALBUMIN 3.3* 2.7* 2.8* 2.6* 2.3* 2.5*    Recent Labs Lab 01/16/2016 1056  LIPASE 49   No results for input(s): AMMONIA in the last 168 hours.  CBC:  Recent Labs Lab 01/31/2016 1056 01/17/2016 1907 01/25/16 0400 01/26/16 0500 01/27/16 0514  WBC 7.6 4.7 6.1 8.4 9.3  NEUTROABS 6.1  --   --   --   --   HGB 9.7* 8.8* 8.0* 8.2* 8.2*  HCT 32.8* 28.9* 27.1* 26.6* 26.7*  MCV 96.8 93.2 94.4 91.1 91.4  PLT 181 227 147* 173 143*    Cardiac Enzymes: No results for input(s): CKTOTAL, CKMB, CKMBINDEX, TROPONINI in the last 168 hours.  BNP: Invalid input(s): POCBNP  CBG:  Recent Labs Lab 01/26/16 1552 01/26/16 1929 01/27/16 0016 01/27/16 0337 01/27/16 0823  GLUCAP 115* 139* 104* 109* 165*      Microbiology: Results for orders placed or performed during the hospital encounter of 01/31/2016  Culture, respiratory (NON-Expectorated)     Status: None (Preliminary result)   Collection Time: 02/03/2016  2:20 PM  Result Value Ref Range Status   Specimen Description ENDOTRACHEAL  Final   Special Requests NONE  Final   Gram Stain   Final    FEW WBC PRESENT, PREDOMINANTLY PMN RARE GRAM POSITIVE COCCI IN CHAINS RARE GRAM NEGATIVE RODS    Culture CULTURE REINCUBATED FOR BETTER GROWTH  Final   Report Status PENDING  Incomplete  Culture, blood (routine x 2)     Status: None (Preliminary result)   Collection Time: 01/16/2016  7:05 PM  Result Value Ref Range Status   Specimen Description BLOOD RIGHT HAND  Final   Special Requests IN PEDIATRIC BOTTLE 1CC  Final   Culture NO GROWTH 2 DAYS  Final   Report Status PENDING  Incomplete  Culture, blood (routine x 2)     Status: None (Preliminary result)   Collection Time: 01/13/2016  7:10 PM  Result Value  Ref Range Status   Specimen Description BLOOD LEFT HAND  Final   Special Requests IN PEDIATRIC BOTTLE 1CC  Final   Culture NO GROWTH 2 DAYS  Final   Report Status PENDING  Incomplete  MRSA PCR Screening     Status: None   Collection Time: 01/27/2016  9:19 PM  Result Value Ref Range Status   MRSA by PCR NEGATIVE NEGATIVE Final    Comment:        The GeneXpert MRSA Assay (FDA approved for NASAL specimens only), is one component of a comprehensive MRSA colonization surveillance program. It is not intended to diagnose MRSA infection nor to guide or monitor treatment for MRSA infections.     Coagulation Studies:  Recent Labs  01/27/16 0514  LABPROT 13.7  INR 1.04    Urinalysis:  Recent Labs  01/27/2016 1928  COLORURINE YELLOW  LABSPEC 1.013  PHURINE 5.0  GLUCOSEU 150*  HGBUR SMALL*  BILIRUBINUR NEGATIVE  KETONESUR NEGATIVE  PROTEINUR >=300*  NITRITE NEGATIVE  LEUKOCYTESUR NEGATIVE      Imaging: Dg Chest Port 1 View  Result Date: 01/27/2016 CLINICAL DATA:  Hypoxia EXAM: PORTABLE CHEST 1 VIEW COMPARISON:  January 26, 2016 FINDINGS: Endotracheal tube tip is 4.9 cm above the carina. Nasogastric tube tip and side port are below the diaphragm. Central catheter tip is in the left innominate vein, stable. No pneumothorax. There is cardiomegaly with pulmonary venous hypertension. There is interstitial and patchy alveolar edema bilaterally. There are pleural effusions bilaterally. There is aortic atherosclerosis. IMPRESSION: Tube and catheter positions as described without pneumothorax. Evidence of congestive heart failure. Aortic atherosclerosis. Electronically Signed   By: William  Woodruff III M.D.   On: 01/27/2016 08:18   Dg Chest Port 1 View  Result Date: 01/26/2016 CLINICAL DATA:  Encounter for central line placement. EXAM: PORTABLE CHEST 1 VIEW COMPARISON:  Radiograph January 25, 2016. FINDINGS: Stable cardiomegaly. Endotracheal and nasogastric tubes are unchanged in  position. No pneumothorax is noted. Increased bibasilar opacities are noted concerning for worsening edema or atelectasis with associated pleural effusions. Bony thorax is unremarkable. IMPRESSION: Stable support apparatus. Increased bibasilar opacities are noted concerning for worsening edema or atelectasis with associated pleural effusions. Electronically Signed   By: James  Green Jr, M.D.   On: 01/26/2016 13:02     Medications:   . sodium chloride 10 mL/hr at 01/27/16 0700  . sodium chloride 10 mL/hr at 01/25/16 1744  .   feeding supplement (VITAL HIGH PROTEIN) 1,000 mL (01/27/16 0700)  . fentaNYL infusion INTRAVENOUS 200 mcg/hr (01/27/16 0700)  . midazolam (VERSED) infusion 4 mg/hr (01/27/16 0830)  . norepinephrine (LEVOPHED) Adult infusion 8 mcg/min (01/27/16 0830)  . dialysis replacement fluid (prismasate) 400 mL/hr at 01/27/16 0437  . dialysis replacement fluid (prismasate) 200 mL/hr at 01/27/16 0451  . dialysate (PRISMASATE) 2,000 mL/hr at 01/27/16 0754   . chlorhexidine gluconate (MEDLINE KIT)  15 mL Mouth Rinse BID  . famotidine (PEPCID) IV  20 mg Intravenous Q24H  . feeding supplement (PRO-STAT SUGAR FREE 64)  30 mL Per Tube QID  . hydrocortisone sod succinate (SOLU-CORTEF) inj  50 mg Intravenous Q6H  . insulin aspart  0-20 Units Subcutaneous Q4H  . mouth rinse  15 mL Mouth Rinse QID   alteplase, fentaNYL, fentaNYL (SUBLIMAZE) injection, heparin, midazolam, sodium chloride  Assessment/ Plan:  The patient is a 65 y.o.year-old with history of HTN, DM, gout and CKD dating back to 2009 with records here. Was last here in July 2017 with leg weakness and parox afib, had creat 8 and AVF x 47mo, did not require HD.  1. Renal failure stage 5 - creat 6, was 8 in July. Hx bx-proven FSGS. Has AVF L arm no bruit.  2. Severe acidemia improved 3. Hypothermia prob sepsis 4. Pulm - diffuse infiltrates, ARDS vs other 5. LE edema Continue volume removal 100 an hour   6. Hypotension 7. DM2 No changes today in the dialysis prescription. Biggest issues seem to surround removal of volume continue in negative fluid balance   LOS: 3 Jameela Michna W _0 _1 :29 AM

## 2016-01-28 DIAGNOSIS — R6521 Severe sepsis with septic shock: Secondary | ICD-10-CM

## 2016-01-28 DIAGNOSIS — A419 Sepsis, unspecified organism: Principal | ICD-10-CM

## 2016-01-28 DIAGNOSIS — N186 End stage renal disease: Secondary | ICD-10-CM

## 2016-01-28 DIAGNOSIS — J9601 Acute respiratory failure with hypoxia: Secondary | ICD-10-CM

## 2016-01-28 DIAGNOSIS — Z992 Dependence on renal dialysis: Secondary | ICD-10-CM

## 2016-01-28 LAB — GLUCOSE, CAPILLARY
GLUCOSE-CAPILLARY: 146 mg/dL — AB (ref 65–99)
GLUCOSE-CAPILLARY: 132 mg/dL — AB (ref 65–99)
Glucose-Capillary: 108 mg/dL — ABNORMAL HIGH (ref 65–99)
Glucose-Capillary: 123 mg/dL — ABNORMAL HIGH (ref 65–99)
Glucose-Capillary: 134 mg/dL — ABNORMAL HIGH (ref 65–99)
Glucose-Capillary: 135 mg/dL — ABNORMAL HIGH (ref 65–99)

## 2016-01-28 LAB — RENAL FUNCTION PANEL
ALBUMIN: 2.5 g/dL — AB (ref 3.5–5.0)
ANION GAP: 10 (ref 5–15)
ANION GAP: 11 (ref 5–15)
Albumin: 2.5 g/dL — ABNORMAL LOW (ref 3.5–5.0)
BUN: 40 mg/dL — ABNORMAL HIGH (ref 6–20)
BUN: 33 mg/dL — ABNORMAL HIGH (ref 6–20)
CALCIUM: 9.2 mg/dL (ref 8.9–10.3)
CALCIUM: 9.3 mg/dL (ref 8.9–10.3)
CO2: 24 mmol/L (ref 22–32)
CO2: 25 mmol/L (ref 22–32)
CREATININE: 2.36 mg/dL — AB (ref 0.61–1.24)
Chloride: 103 mmol/L (ref 101–111)
Chloride: 102 mmol/L (ref 101–111)
Creatinine, Ser: 2.68 mg/dL — ABNORMAL HIGH (ref 0.61–1.24)
GFR calc Af Amer: 27 mL/min — ABNORMAL LOW (ref >60)
GFR calc non Af Amer: 23 mL/min — ABNORMAL LOW (ref >60)
GFR calc non Af Amer: 27 mL/min — ABNORMAL LOW (ref >60)
GFR, EST AFRICAN AMERICAN: 31 mL/min — AB (ref >60)
GLUCOSE: 129 mg/dL — AB (ref 65–99)
GLUCOSE: 144 mg/dL — AB (ref 65–99)
PHOSPHORUS: 4.2 mg/dL (ref 2.5–4.6)
PHOSPHORUS: 3.7 mg/dL (ref 2.5–4.6)
POTASSIUM: 4.4 mmol/L (ref 3.5–5.1)
Potassium: 4.6 mmol/L (ref 3.5–5.1)
SODIUM: 137 mmol/L (ref 135–145)
SODIUM: 138 mmol/L (ref 135–145)

## 2016-01-28 LAB — APTT: APTT: 31 s (ref 24–36)

## 2016-01-28 LAB — MAGNESIUM: Magnesium: 2.4 mg/dL (ref 1.7–2.4)

## 2016-01-28 MED ORDER — BISACODYL 5 MG PO TBEC: 10.0000 mg | DELAYED_RELEASE_TABLET | Freq: Every day | ORAL | Status: DC | PRN

## 2016-01-28 MED ORDER — BISACODYL 10 MG RE SUPP
10.0000 mg | Freq: Every day | RECTAL | Status: DC | PRN
  Administered 2016-01-28: 10 mg via RECTAL
  Filled 2016-01-28: qty 1

## 2016-01-28 NOTE — Progress Notes (Signed)
PCCM Admission Note  Admission date: 02/04/2016 Referring provider: Dr. Sherry Ruffing, ER  CC: feels weak  HPI: Hx from chart.  65 yo male brought to ER 12/17 with generalized weakness.  Intubated for hypoxia/ hypercarbia & hypothermia 89 .He has CKD 5- biopsy proven FSGS  and CXR showed pulmonary edema.    Events:  12/17 admit, renal consulted 12/18 CRRT 12/19 Severe agitation   Pulled HD cath out.  12/20 seroquel + klonopin added  Subjective/Interval Hx:  Remains critically ill  On levophed 51mcg,  on CVVHD - pulling 179ml/hr  Sedated on fent gtt, versed off  Vital signs: BP 97/62   Pulse 88   Temp 97.5 F (36.4 C) (Oral)   Resp 20   Ht 6\' 1"  (1.854 m)   Wt (!) 328 lb 14.8 oz (149.2 kg)   SpO2 94%   BMI 43.40 kg/m   Intake/output: I/O last 3 completed shifts: In: 4864.8 [I.V.:1964.8; NG/GT:2850; IV Piggyback:50] Out: 8169 [Urine:95; Other:8074]  General: sedated, NAD on vent, CVVHD ongoing Neuro: RASS -3,  HEENT: ETT , no pallor, icterus Cardiac: regular, no murmur Chest: resps even non labored on vent, coarse, few scattered crackles  Abd: obese, soft, non tender, decreased bowel sounds Ext: 1+ edema, AVF L arm - bruitt Skin: venous stasis changes  CMP Latest Ref Rng & Units 01/28/2016 01/27/2016 01/27/2016  Glucose 65 - 99 mg/dL 129(H) 148(H) 125(H)  BUN 6 - 20 mg/dL 40(H) 35(H) 39(H)  Creatinine 0.61 - 1.24 mg/dL 2.68(H) 2.46(H) 3.10(H)  Sodium 135 - 145 mmol/L 137 138 137  Potassium 3.5 - 5.1 mmol/L 4.4 4.4 4.6  Chloride 101 - 111 mmol/L 103 104 104  CO2 22 - 32 mmol/L 24 26 27   Calcium 8.9 - 10.3 mg/dL 9.2 9.0 8.9  Total Protein 6.5 - 8.1 g/dL - - -  Total Bilirubin 0.3 - 1.2 mg/dL - - -  Alkaline Phos 38 - 126 U/L - - -  AST 15 - 41 U/L - - -  ALT 17 - 63 U/L - - -    CBC Latest Ref Rng & Units 01/27/2016 01/26/2016 01/25/2016  WBC 4.0 - 10.5 K/uL 9.3 8.4 6.1  Hemoglobin 13.0 - 17.0 g/dL 8.2(L) 8.2(L) 8.0(L)  Hematocrit 39.0 - 52.0 % 26.7(L) 26.6(L)  27.1(L)  Platelets 150 - 400 K/uL 143(L) 173 147(L)    ABG    Component Value Date/Time   PHART 7.328 (L) 01/25/2016 0345   PCO2ART 48.5 (H) 01/25/2016 0345   PO2ART 90.4 01/25/2016 0345   HCO3 24.7 01/25/2016 0345   TCO2 25 01/09/2016 1647   ACIDBASEDEF 0.5 01/25/2016 0345   O2SAT 95.5 01/25/2016 0345    CBG (last 3)   Recent Labs  01/27/16 2318 01/28/16 0323 01/28/16 0812  GLUCAP 124* 135* 123*     Imaging: Dg Chest Port 1 View  Result Date: 01/27/2016 CLINICAL DATA:  Hypoxia EXAM: PORTABLE CHEST 1 VIEW COMPARISON:  January 26, 2016 FINDINGS: Endotracheal tube tip is 4.9 cm above the carina. Nasogastric tube tip and side port are below the diaphragm. Central catheter tip is in the left innominate vein, stable. No pneumothorax. There is cardiomegaly with pulmonary venous hypertension. There is interstitial and patchy alveolar edema bilaterally. There are pleural effusions bilaterally. There is aortic atherosclerosis. IMPRESSION: Tube and catheter positions as described without pneumothorax. Evidence of congestive heart failure. Aortic atherosclerosis. Electronically Signed   By: Lowella Grip III M.D.   On: 01/27/2016 08:18   Dg Chest [facility]  Result Date: 01/26/2016 CLINICAL DATA:  Encounter for central line placement. EXAM: PORTABLE CHEST 1 VIEW COMPARISON:  Radiograph January 25, 2016. FINDINGS: Stable cardiomegaly. Endotracheal and nasogastric tubes are unchanged in position. No pneumothorax is noted. Increased bibasilar opacities are noted concerning for worsening edema or atelectasis with associated pleural effusions. Bony thorax is unremarkable. IMPRESSION: Stable support apparatus. Increased bibasilar opacities are noted concerning for worsening edema or atelectasis with associated pleural effusions. Electronically Signed   By: Marijo Conception, M.D.   On: 01/26/2016 13:02    Studies: CT head 12/17 >>neg acute  Echo >> 02/06/2016>> EF 60-65%, Grade 1  diastolic dysfunction  Cultures: Blood 12/17 >>ngtd>>> Sputum 12/17 >>normal flora   Antitibiotics:  Lines/tubes:  ETT 12/17 >> HD cath R Femoral >> 12/19 Left IJ HD 12/19>>>    ASSESSMENT / PLAN:  Shock of unclear etiology, ? Relative adrenal insuff, now on CRRT  PULMONARY Acute hypoxic/hypercapnic respiratory failure in setting of pulmonary edema. P:   Daily SBT once agitation controlled Wean FiO2 as able Continue peep 8     CARDIOVASCULAR Pulmonary edema  Hx of HTN, HLD. Hypothermia resolving EF>> 60-65% ( 12/18), no pericardial efusion Hypotension - / adrenal insuff P:  Continue low dose levophed as needed to maintain MAP and allow volume removal  hold home norvasc, lasix, crestor, toprol Cortisol=9.8 - Continue stress steroids   RENAL ESRD , now on CRRT Goal hourly net neg 100 cc / hr P:   F/u chem  Strict I&O Renal following ,CVVHD ongoing , will need permacath eventually   GASTROINTESTINAL No active issue  P:   PPI  NPO TF at goal   HEMATOLOGIC Anemia of chronic disease P:  F/u CBC SCD's  Transfuse for HGB >7 Trend coags as needed  INFECTIOUS No active issue  P:   Trend wbc, fever curve  Pct although ? relaiable in ESRD  ENDOCRINE DM   P:   SSI   NEUROLOGIC Acute metabolic encephalopathy - CT head neg  Agitation  P:   RASS goal 0 to -1 Continue to hold outpt neurontin  fentanyl gtt, dc versed gtt Daily WUA  Dc clonazepam 0.5BID and ct  seroquel 25mg  BID) and attempt wean gtts   The patient is critically ill with multiple organ systems failure and requires high complexity decision making for assessment and support, frequent evaluation and titration of therapies, application of advanced monitoring technologies and extensive interpretation of multiple databases. Critical Care Time devoted to patient care services described in this note independent of APP time is 35 minutes.   Kara Mead MD. Shade Flood. [facility] Pager [phone] If no response call [phone]     01/28/2016  11:14 AM

## 2016-01-28 NOTE — Progress Notes (Signed)
[facility] ROUNDING NOTE   Subjective:   Interval History: Still very combative when sedation is reduced    Continues on CRRT with no issues   Objective:  Vital signs in last 24 hours:  Temp:  [94.4 F (34.7 C)-98.5 F (36.9 C)] 97.5 F (36.4 C) (12/21 0819) Pulse Rate:  [59-90] 75 (12/21 0830) Resp:  [14-20] 20 (12/21 0830) BP: (66-121)/(30-72) 92/59 (12/21 0830) SpO2:  [76 %-100 %] 94 % (12/21 0830) FiO2 (%):  [40 %] 40 % (12/21 0815) Weight:  [149.2 kg (328 lb 14.8 oz)] 149.2 kg (328 lb 14.8 oz) (12/21 0415)  Weight change: -4.7 kg (-10 lb 5.8 oz) Filed Weights   01/26/16 0451 01/27/16 0421 01/28/16 0415  Weight: (!) 154.2 kg (339 lb 15.2 oz) (!) 153.9 kg (339 lb 4.6 oz) (!) 149.2 kg (328 lb 14.8 oz)    Intake/Output: I/O last 3 completed shifts: In: 4864.8 [I.V.:1964.8; NG/GT:2850; IV Piggyback:50] Out: 0233 [Urine:95; Other:8074]   Intake/Output this shift:  Total I/O In: 116.5 [I.V.:46.5; NG/GT:70] Out: 227 [Other:227]  General: sedated, NAD on vent, CVVHD  HEENT: ETT Cardiac: regular, no murmur Chest: resps even non labored on vent, coarse, few scattered crackles  Abd: obese, soft, non tender, decreased bowel sounds Ext: 1+ edema, AVF L arm - no bruit Skin: venous stasis changes   Basic Metabolic Panel:  Recent Labs Lab 01/25/16 0400  01/26/16 0500 01/26/16 1600 01/27/16 0514 01/27/16 1823 01/28/16 0355  NA 142  < > 139 138 137 138 137  K 4.5  < > 4.5 4.2 4.6 4.4 4.4  CL 108  < > 104 107 104 104 103  CO2 24  < > '26 24 27 26 24  '$ GLUCOSE 152*  < > 124* 121* 125* 148* 129*  BUN 43*  < > 31* 28* 39* 35* 40*  CREATININE 4.70*  < > 2.81* 2.31* 3.10* 2.46* 2.68*  CALCIUM 8.8*  < > 8.9 8.6* 8.9 9.0 9.2  MG 2.1  --  2.5*  --  2.5*  --  2.4  PHOS 5.1*  < > 3.8 3.2 4.0 3.8 4.2  < > = values in this interval not displayed.  Liver Function Tests:  Recent Labs Lab 01/18/2016 1056 01/25/16 0400  01/26/16 0500 01/26/16 1600  01/27/16 0514 01/27/16 1823 01/28/16 0355  AST 35 34  --   --   --   --   --   --   ALT 47 44  --   --   --   --   --   --   ALKPHOS 103 88  --   --   --   --   --   --   BILITOT 0.4 0.4  --   --   --   --   --   --   PROT 6.9 5.8*  --   --   --   --   --   --   ALBUMIN 3.3* 2.7*  < > 2.6* 2.3* 2.5* 2.6* 2.5*  < > = values in this interval not displayed.  Recent Labs Lab 01/16/2016 1056  LIPASE 49   No results for input(s): AMMONIA in the last 168 hours.  CBC:  Recent Labs Lab 01/29/2016 1056 01/26/2016 1907 01/25/16 0400 01/26/16 0500 01/27/16 0514  WBC 7.6 4.7 6.1 8.4 9.3  NEUTROABS 6.1  --   --   --   --   HGB 9.7* 8.8* 8.0* 8.2* 8.2*  HCT 32.8*  28.9* 27.1* 26.6* 26.7*  MCV 96.8 93.2 94.4 91.1 91.4  PLT 181 227 147* 173 143*    Cardiac Enzymes: No results for input(s): CKTOTAL, CKMB, CKMBINDEX, TROPONINI in the last 168 hours.  BNP: Invalid input(s): POCBNP  CBG:  Recent Labs Lab 01/27/16 1602 01/27/16 2003 01/27/16 2318 01/28/16 0323 01/28/16 0812  GLUCAP 146* 157* 124* 135* 45*    Microbiology: Results for orders placed or performed during the hospital encounter of 02/05/2016  Culture, respiratory (NON-Expectorated)     Status: None   Collection Time: 01/26/2016  2:20 PM  Result Value Ref Range Status   Specimen Description ENDOTRACHEAL  Final   Special Requests NONE  Final   Gram Stain   Final    FEW WBC PRESENT, PREDOMINANTLY PMN RARE GRAM POSITIVE COCCI IN CHAINS RARE GRAM NEGATIVE RODS    Culture Consistent with normal respiratory flora.  Final   Report Status 01/27/2016 FINAL  Final  Culture, blood (routine x 2)     Status: None (Preliminary result)   Collection Time: 01/15/2016  7:05 PM  Result Value Ref Range Status   Specimen Description BLOOD RIGHT HAND  Final   Special Requests IN PEDIATRIC BOTTLE 1CC  Final   Culture NO GROWTH 3 DAYS  Final   Report Status PENDING  Incomplete  Culture, blood (routine x 2)     Status: None (Preliminary  result)   Collection Time: 01/13/2016  7:10 PM  Result Value Ref Range Status   Specimen Description BLOOD LEFT HAND  Final   Special Requests IN PEDIATRIC BOTTLE 1CC  Final   Culture NO GROWTH 3 DAYS  Final   Report Status PENDING  Incomplete  MRSA PCR Screening     Status: None   Collection Time: 01/29/2016  9:19 PM  Result Value Ref Range Status   MRSA by PCR NEGATIVE NEGATIVE Final    Comment:        The GeneXpert MRSA Assay (FDA approved for NASAL specimens only), is one component of a comprehensive MRSA colonization surveillance program. It is not intended to diagnose MRSA infection nor to guide or monitor treatment for MRSA infections.     Coagulation Studies:  Recent Labs  01/27/16 0514  LABPROT 13.7  INR 1.04    Urinalysis: No results for input(s): COLORURINE, LABSPEC, PHURINE, GLUCOSEU, HGBUR, BILIRUBINUR, KETONESUR, PROTEINUR, UROBILINOGEN, NITRITE, LEUKOCYTESUR in the last 72 hours.  Invalid input(s): APPERANCEUR    Imaging: Dg Chest Port 1 View  Result Date: 01/27/2016 CLINICAL DATA:  Hypoxia EXAM: PORTABLE CHEST 1 VIEW COMPARISON:  January 26, 2016 FINDINGS: Endotracheal tube tip is 4.9 cm above the carina. Nasogastric tube tip and side port are below the diaphragm. Central catheter tip is in the left innominate vein, stable. No pneumothorax. There is cardiomegaly with pulmonary venous hypertension. There is interstitial and patchy alveolar edema bilaterally. There are pleural effusions bilaterally. There is aortic atherosclerosis. IMPRESSION: Tube and catheter positions as described without pneumothorax. Evidence of congestive heart failure. Aortic atherosclerosis. Electronically Signed   By: Lowella Grip III M.D.   On: 01/27/2016 08:18   Dg Chest Port 1 View  Result Date: 01/26/2016 CLINICAL DATA:  Encounter for central line placement. EXAM: PORTABLE CHEST 1 VIEW COMPARISON:  Radiograph January 25, 2016. FINDINGS: Stable cardiomegaly. Endotracheal  and nasogastric tubes are unchanged in position. No pneumothorax is noted. Increased bibasilar opacities are noted concerning for worsening edema or atelectasis with associated pleural effusions. Bony thorax is unremarkable. IMPRESSION: Stable support apparatus. Increased bibasilar  opacities are noted concerning for worsening edema or atelectasis with associated pleural effusions. Electronically Signed   By: Marijo Conception, M.D.   On: 01/26/2016 13:02     Medications:   . sodium chloride 10 mL/hr at 01/28/16 0800  . sodium chloride 10 mL/hr at 01/25/16 1744  . feeding supplement (VITAL HIGH PROTEIN) 1,000 mL (01/28/16 0800)  . fentaNYL infusion INTRAVENOUS 180 mcg/hr (01/28/16 0800)  . midazolam (VERSED) infusion 2 mg/hr (01/28/16 0800)  . norepinephrine (LEVOPHED) Adult infusion 3 mcg/min (01/28/16 0801)  . dialysis replacement fluid (prismasate) 400 mL/hr at 01/28/16 0715  . dialysis replacement fluid (prismasate) 200 mL/hr at 01/28/16 0658  . dialysate (PRISMASATE) 2,000 mL/hr at 01/28/16 0654   . chlorhexidine gluconate (MEDLINE KIT)  15 mL Mouth Rinse BID  . clonazePAM  0.5 mg Oral BID  . famotidine (PEPCID) IV  20 mg Intravenous Q24H  . feeding supplement (PRO-STAT SUGAR FREE 64)  30 mL Per Tube QID  . hydrocortisone sod succinate (SOLU-CORTEF) inj  50 mg Intravenous Q6H  . insulin aspart  0-20 Units Subcutaneous Q4H  . mouth rinse  15 mL Mouth Rinse QID  . QUEtiapine  25 mg Oral BID   alteplase, fentaNYL, fentaNYL (SUBLIMAZE) injection, heparin, midazolam, sodium chloride  Assessment/ Plan:  The patient is a 65 y.o.year-old with history of HTN, DM, gout and CKD dating back to 2009 with records here. Was last here in July 2017 with leg weakness and parox afib, had creat 8 and AVF x 90mo, did not require HD.  1. Renal failure stage 5 - creat 6, was 8 in July. Hx bx-proven FSGS. Has AVF L arm no bruit.  2. Severe acidemia improved 3. Hypothermia bear hugger warmer 4. Pulm -  diffuse infiltrates, ARDS vs other 5. LE edema Continue volume removal 100 an hour  6. Hypotension 7. DM2 No changes today in the dialysis prescription. Biggest issues seem to surround removal of volume continue in negative fluid balance. MAP goal 55 will continue to remove fluid   LOS: 4 Arlin Sass W '@TODAY''@9'$ :09 AM

## 2016-01-28 NOTE — Care Management Important Message (Signed)
Important Message  Patient Details  Name: Miguel [name] MRN: [id number] Date of Birth: [birth year]   Medicare Important Message Given:  Other (see comment)    [name], [name] 01/28/2016, 10:30 AM

## 2016-01-29 ENCOUNTER — Inpatient Hospital Stay (HOSPITAL_COMMUNITY): Payer: Medicare Other

## 2016-01-29 DIAGNOSIS — J810 Acute pulmonary edema: Secondary | ICD-10-CM

## 2016-01-29 LAB — RENAL FUNCTION PANEL
ALBUMIN: 2.5 g/dL — AB (ref 3.5–5.0)
ANION GAP: 11 (ref 5–15)
Albumin: 2.4 g/dL — ABNORMAL LOW (ref 3.5–5.0)
Anion gap: 11 (ref 5–15)
BUN: 37 mg/dL — ABNORMAL HIGH (ref 6–20)
BUN: 37 mg/dL — ABNORMAL HIGH (ref 6–20)
CALCIUM: 9.1 mg/dL (ref 8.9–10.3)
CO2: 26 mmol/L (ref 22–32)
CO2: 25 mmol/L (ref 22–32)
Calcium: 9.5 mg/dL (ref 8.9–10.3)
Chloride: 101 mmol/L (ref 101–111)
Chloride: 102 mmol/L (ref 101–111)
Creatinine, Ser: 2.66 mg/dL — ABNORMAL HIGH (ref 0.61–1.24)
Creatinine, Ser: 2.52 mg/dL — ABNORMAL HIGH (ref 0.61–1.24)
GFR calc Af Amer: 27 mL/min — ABNORMAL LOW (ref >60)
GFR calc Af Amer: 29 mL/min — ABNORMAL LOW (ref >60)
GFR calc non Af Amer: 23 mL/min — ABNORMAL LOW (ref >60)
GFR calc non Af Amer: 25 mL/min — ABNORMAL LOW (ref >60)
GLUCOSE: 116 mg/dL — AB (ref 65–99)
GLUCOSE: 135 mg/dL — AB (ref 65–99)
PHOSPHORUS: 3.9 mg/dL (ref 2.5–4.6)
POTASSIUM: 4.8 mmol/L (ref 3.5–5.1)
POTASSIUM: 4.8 mmol/L (ref 3.5–5.1)
Phosphorus: 3.8 mg/dL (ref 2.5–4.6)
SODIUM: 138 mmol/L (ref 135–145)
SODIUM: 138 mmol/L (ref 135–145)

## 2016-01-29 LAB — CULTURE, BLOOD (ROUTINE X 2)
CULTURE: NO GROWTH
Culture: NO GROWTH

## 2016-01-29 LAB — GLUCOSE, CAPILLARY
GLUCOSE-CAPILLARY: 121 mg/dL — AB (ref 65–99)
GLUCOSE-CAPILLARY: 138 mg/dL — AB (ref 65–99)
GLUCOSE-CAPILLARY: 127 mg/dL — AB (ref 65–99)
Glucose-Capillary: 103 mg/dL — ABNORMAL HIGH (ref 65–99)
Glucose-Capillary: 142 mg/dL — ABNORMAL HIGH (ref 65–99)
Glucose-Capillary: 133 mg/dL — ABNORMAL HIGH (ref 65–99)

## 2016-01-29 LAB — CBC
HCT: 26.2 % — ABNORMAL LOW (ref 39.0–52.0)
HEMOGLOBIN: 7.9 g/dL — AB (ref 13.0–17.0)
MCH: 27.9 pg (ref 26.0–34.0)
MCHC: 30.2 g/dL (ref 30.0–36.0)
MCV: 92.6 fL (ref 78.0–100.0)
Platelets: 152 10*3/uL (ref 150–400)
RBC: 2.83 MIL/uL — ABNORMAL LOW (ref 4.22–5.81)
RDW: 18.8 % — ABNORMAL HIGH (ref 11.5–15.5)
WBC: 11.5 10*3/uL — ABNORMAL HIGH (ref 4.0–10.5)

## 2016-01-29 LAB — MAGNESIUM: Magnesium: 2.5 mg/dL — ABNORMAL HIGH (ref 1.7–2.4)

## 2016-01-29 LAB — APTT: aPTT: 30 seconds (ref 24–36)

## 2016-01-29 LAB — PROCALCITONIN: Procalcitonin: 0.31 ng/mL

## 2016-01-29 MED ORDER — VANCOMYCIN HCL 10 G IV SOLR
1500.0000 mg | INTRAVENOUS | Status: DC
  Administered 2016-01-30 – 2016-02-02 (×4): 1500 mg via INTRAVENOUS
  Filled 2016-01-29 (×5): qty 1500

## 2016-01-29 MED ORDER — SENNOSIDES 8.8 MG/5ML PO SYRP
5.0000 mL | ORAL_SOLUTION | Freq: Two times a day (BID) | ORAL | Status: DC
  Administered 2016-01-29 – 2016-02-02 (×9): 5 mL via ORAL
  Filled 2016-01-29 (×11): qty 5

## 2016-01-29 MED ORDER — CLONAZEPAM 0.1 MG/ML ORAL SUSPENSION: 1.0000 mg | Freq: Every day | ORAL | Status: DC

## 2016-01-29 MED ORDER — DOCUSATE SODIUM 50 MG/5ML PO LIQD
100.0000 mg | Freq: Two times a day (BID) | ORAL | Status: DC
  Administered 2016-01-29 – 2016-02-02 (×9): 100 mg via ORAL
  Filled 2016-01-29 (×11): qty 10

## 2016-01-29 MED ORDER — VANCOMYCIN HCL 10 G IV SOLR
2500.0000 mg | Freq: Once | INTRAVENOUS | Status: AC
  Administered 2016-01-29: 2500 mg via INTRAVENOUS
  Filled 2016-01-29: qty 2500

## 2016-01-29 MED ORDER — LACTULOSE 10 GM/15ML PO SOLN
30.0000 g | Freq: Once | ORAL | Status: AC
  Administered 2016-01-29: 30 g
  Filled 2016-01-29: qty 45

## 2016-01-29 MED ORDER — BISACODYL 10 MG RE SUPP
10.0000 mg | Freq: Once | RECTAL | Status: AC
  Administered 2016-01-29: 10 mg via RECTAL
  Filled 2016-01-29: qty 1

## 2016-01-29 MED ORDER — PIPERACILLIN-TAZOBACTAM 3.375 G IVPB 30 MIN
3.3750 g | Freq: Four times a day (QID) | INTRAVENOUS | Status: DC
  Administered 2016-01-29 – 2016-02-05 (×28): 3.375 g via INTRAVENOUS
  Filled 2016-01-29 (×29): qty 50

## 2016-01-29 MED ORDER — CLONAZEPAM 0.5 MG PO TABS
1.0000 mg | ORAL_TABLET | Freq: Every day | ORAL | Status: DC
  Administered 2016-01-29 – 2016-01-30 (×2): 1 mg
  Filled 2016-01-29 (×3): qty 2

## 2016-01-29 MED ORDER — PIPERACILLIN-TAZOBACTAM 3.375 G IVPB
3.3750 g | Freq: Three times a day (TID) | INTRAVENOUS | Status: DC
  Administered 2016-01-29: 3.375 g via INTRAVENOUS
  Filled 2016-01-29: qty 50

## 2016-01-29 MED ORDER — QUETIAPINE FUMARATE 100 MG PO TABS
100.0000 mg | ORAL_TABLET | Freq: Two times a day (BID) | ORAL | Status: DC
  Administered 2016-01-29 – 2016-01-30 (×2): 100 mg via ORAL
  Filled 2016-01-29 (×2): qty 1

## 2016-01-29 NOTE — Progress Notes (Signed)
[facility] ROUNDING NOTE   Subjective:   Interval History:  Still combative   Objective:  Vital signs in last 24 hours:  Temp:  [97.7 F (36.5 C)-99 F (37.2 C)] 98.1 F (36.7 C) (12/22 0840) Pulse Rate:  [65-109] 76 (12/22 0945) Resp:  [18-39] 20 (12/22 0945) BP: (73-125)/(49-68) 88/58 (12/22 0945) SpO2:  [87 %-98 %] 94 % (12/22 0945) FiO2 (%):  [40 %] 40 % (12/22 0800) Weight:  [147.2 kg (324 lb 8.3 oz)] 147.2 kg (324 lb 8.3 oz) (12/22 0445)  Weight change: -2 kg (-4 lb 6.6 oz) Filed Weights   01/27/16 0421 01/28/16 0415 01/29/16 0445  Weight: (!) 153.9 kg (339 lb 4.6 oz) (!) 149.2 kg (328 lb 14.8 oz) (!) 147.2 kg (324 lb 8.3 oz)    Intake/Output: I/O last 3 completed shifts: In: 4487.5 [I.V.:1707.5; NG/GT:2730; IV Piggyback:50] Out: 8010 [Urine:85; Other:7925]   Intake/Output this shift:  Total I/O In: 453.3 [I.V.:143.3; NG/GT:260; IV Piggyback:50] Out: 654 [Urine:10; Other:644]  CVS- RRR RS- CTA few scattered rales  ABD- BS present soft non-distended EXT- chronic stasis changes   AVF L arm bruit    Basic Metabolic Panel:  Recent Labs Lab 01/25/16 0400  01/26/16 0500  01/27/16 0514 01/27/16 1823 01/28/16 0355 01/28/16 1505 01/29/16 0500  NA 142  < > 139  < > 137 138 137 138 138  K 4.5  < > 4.5  < > 4.6 4.4 4.4 4.6 4.8  CL 108  < > 104  < > 104 104 103 102 101  CO2 24  < > 26  < > '27 26 24 25 26  '$ GLUCOSE 152*  < > 124*  < > 125* 148* 129* 144* 116*  BUN 43*  < > 31*  < > 39* 35* 40* 33* 37*  CREATININE 4.70*  < > 2.81*  < > 3.10* 2.46* 2.68* 2.36* 2.66*  CALCIUM 8.8*  < > 8.9  < > 8.9 9.0 9.2 9.3 9.5  MG 2.1  --  2.5*  --  2.5*  --  2.4  --  2.5*  PHOS 5.1*  < > 3.8  < > 4.0 3.8 4.2 3.7 3.9  < > = values in this interval not displayed.  Liver Function Tests:  Recent Labs Lab 01/30/2016 1056 01/25/16 0400  01/27/16 0514 01/27/16 1823 01/28/16 0355 01/28/16 1505 01/29/16 0500  AST 35 34  --   --   --   --   --   --   ALT 47 44   --   --   --   --   --   --   ALKPHOS 103 88  --   --   --   --   --   --   BILITOT 0.4 0.4  --   --   --   --   --   --   PROT 6.9 5.8*  --   --   --   --   --   --   ALBUMIN 3.3* 2.7*  < > 2.5* 2.6* 2.5* 2.5* 2.5*  < > = values in this interval not displayed.  Recent Labs Lab 02/02/2016 1056  LIPASE 49   No results for input(s): AMMONIA in the last 168 hours.  CBC:  Recent Labs Lab 02/05/2016 1056 01/20/2016 1907 01/25/16 0400 01/26/16 0500 01/27/16 0514 01/29/16 0500  WBC 7.6 4.7 6.1 8.4 9.3 11.5*  NEUTROABS 6.1  --   --   --   --   --  HGB 9.7* 8.8* 8.0* 8.2* 8.2* 7.9*  HCT 32.8* 28.9* 27.1* 26.6* 26.7* 26.2*  MCV 96.8 93.2 94.4 91.1 91.4 92.6  PLT 181 227 147* 173 143* 152    Cardiac Enzymes: No results for input(s): CKTOTAL, CKMB, CKMBINDEX, TROPONINI in the last 168 hours.  BNP: Invalid input(s): POCBNP  CBG:  Recent Labs Lab 01/28/16 1507 01/28/16 2006 01/28/16 2347 01/29/16 0353 01/29/16 0837  GLUCAP 132* 134* 108* 103* 121*    Microbiology: Results for orders placed or performed during the hospital encounter of 01/13/2016  Culture, respiratory (NON-Expectorated)     Status: None   Collection Time: 02/01/2016  2:20 PM  Result Value Ref Range Status   Specimen Description ENDOTRACHEAL  Final   Special Requests NONE  Final   Gram Stain   Final    FEW WBC PRESENT, PREDOMINANTLY PMN RARE GRAM POSITIVE COCCI IN CHAINS RARE GRAM NEGATIVE RODS    Culture Consistent with normal respiratory flora.  Final   Report Status 01/27/2016 FINAL  Final  Culture, blood (routine x 2)     Status: None (Preliminary result)   Collection Time: 01/30/2016  7:05 PM  Result Value Ref Range Status   Specimen Description BLOOD RIGHT HAND  Final   Special Requests IN PEDIATRIC BOTTLE 1CC  Final   Culture NO GROWTH 4 DAYS  Final   Report Status PENDING  Incomplete  Culture, blood (routine x 2)     Status: None (Preliminary result)   Collection Time: 02/05/2016  7:10 PM  Result  Value Ref Range Status   Specimen Description BLOOD LEFT HAND  Final   Special Requests IN PEDIATRIC BOTTLE 1CC  Final   Culture NO GROWTH 4 DAYS  Final   Report Status PENDING  Incomplete  MRSA PCR Screening     Status: None   Collection Time: 01/17/2016  9:19 PM  Result Value Ref Range Status   MRSA by PCR NEGATIVE NEGATIVE Final    Comment:        The GeneXpert MRSA Assay (FDA approved for NASAL specimens only), is one component of a comprehensive MRSA colonization surveillance program. It is not intended to diagnose MRSA infection nor to guide or monitor treatment for MRSA infections.     Coagulation Studies:  Recent Labs  01/27/16 0514  LABPROT 13.7  INR 1.04    Urinalysis: No results for input(s): COLORURINE, LABSPEC, PHURINE, GLUCOSEU, HGBUR, BILIRUBINUR, KETONESUR, PROTEINUR, UROBILINOGEN, NITRITE, LEUKOCYTESUR in the last 72 hours.  Invalid input(s): APPERANCEUR    Imaging: Dg Chest Port 1 View  Result Date: 01/29/2016 CLINICAL DATA:  Initial evaluation for acute respiratory failure. EXAM: PORTABLE CHEST 1 VIEW COMPARISON:  Prior radiograph from 01/27/2016. FINDINGS: Endotracheal tube in place, with tip located 4.9 cm above the carina. Enteric tube courses in the the abdomen. Left IJ approach central venous catheter in stable position overlying the left innominate vein. Cardiomegaly stable. Mediastinal silhouette within normal limits. Lungs hypoinflated. Veiling opacities overlying the bilateral hemidiaphragms compatible with effusions. Superimposed pulmonary edema is relatively similar. Bibasilar opacities favored to reflect atelectasis/ edema. No pneumothorax. Osseous structures unchanged. IMPRESSION: 1. Tip of the endotracheal tube 4.9 cm above the carina. Remaining support apparatus as above. 2. Persistent pulmonary edema with bilateral pleural effusions, relatively similar to previous. Bibasilar opacities favored to reflect atelectasis Electronically Signed   By:  Jeannine Boga M.D.   On: 01/29/2016 06:34     Medications:   . sodium chloride 10 mL/hr at 01/29/16 1000  . sodium chloride  Stopped (01/28/16 1900)  . feeding supplement (VITAL HIGH PROTEIN) 1,000 mL (01/29/16 1000)  . fentaNYL infusion INTRAVENOUS 200 mcg/hr (01/29/16 1000)  . midazolam (VERSED) infusion 4 mg/hr (01/29/16 1000)  . norepinephrine (LEVOPHED) Adult infusion 5.013 mcg/min (01/29/16 1000)  . dialysis replacement fluid (prismasate) 400 mL/hr at 01/29/16 0906  . dialysis replacement fluid (prismasate) 200 mL/hr at 01/29/16 0906  . dialysate (PRISMASATE) 2,000 mL/hr at 01/29/16 0906   . chlorhexidine gluconate (MEDLINE KIT)  15 mL Mouth Rinse BID  . famotidine (PEPCID) IV  20 mg Intravenous Q24H  . feeding supplement (PRO-STAT SUGAR FREE 64)  30 mL Per Tube QID  . hydrocortisone sod succinate (SOLU-CORTEF) inj  50 mg Intravenous Q6H  . insulin aspart  0-20 Units Subcutaneous Q4H  . mouth rinse  15 mL Mouth Rinse QID  . QUEtiapine  25 mg Oral BID   alteplase, bisacodyl, fentaNYL, fentaNYL (SUBLIMAZE) injection, heparin, midazolam, sodium chloride  Assessment/ Plan:  The patient is a 65 y.o.year-old with history of HTN, DM, gout and CKD dating back to 2009 with records here. Was last here in July 2017 with leg weakness and parox afib, had creat 8 and AVF x 49mo, did not require HD.  1. Renal failure stage 5 - creat 6, was 8 in July. Hx bx-proven FSGS. Has AVF L arm no bruit.  2. Severe acidemia improved 3. Hypothermia bear hugger warmer 4. Pulm - diffuse infiltrates, ARDS vs other 5. LE edema Continue volume removal 100 an hour  6. Hypotension 7. DM2 No changes today in the dialysis prescription. Biggest issues seem to surround removal of volume continue in negative fluid balance. MAP goal 55 will continue to remove fluid. No changes today. Alarming that he is very combative off sedation    LOS: 5 Sanad Fearnow W '@TODAY''@10'$ :51 AM

## 2016-01-29 NOTE — Progress Notes (Signed)
Pharmacy Antibiotic Note  Miguel [name] is a 65 y.o. male admitted on 02/01/2016 with pneumonia.  Pharmacy has been consulted for Vancomycin and Zosyn dosing. Mild leukocytosis, afebrile. Large increased amounts of secretions from NGT.   Plan: Vancomycin 2500 mg IV x1 then 10 mg/kg (1500mg ) IV every 24 hours while on CRRT.  Zosyn 3.375g IV every 6 hours - each dose over 30 minutes.  Monitor culture results and clinical status.  Monitor CRRT plans   Height: 6\' 1"  (185.4 cm) Weight: (!) 324 lb 8.3 oz (147.2 kg) IBW/kg (Calculated) : 79.9  Temp (24hrs), Avg:98.4 F (36.9 C), Min:97.7 F (36.5 C), Max:99 F (37.2 C)   Recent Labs Lab 01/18/2016 1128 01/15/2016 1522 02/02/2016 1907 01/25/16 0400  01/26/16 0500  01/27/16 0514 01/27/16 1823 01/28/16 0355 01/28/16 1505 01/29/16 0500  WBC  --   --  4.7 6.1  --  8.4  --  9.3  --   --   --  11.5*  CREATININE  --   --   --  4.70*  < > 2.81*  < > 3.10* 2.46* 2.68* 2.36* 2.66*  LATICACIDVEN 1.21 1.14  --   --   --   --   --   --   --   --   --   --   < > = values in this interval not displayed.  Estimated Creatinine Clearance: 40.7 mL/min (by C-G formula based on SCr of 2.66 mg/dL (H)).    No Known Allergies  Antimicrobials this admission: Vancomycin 12/22 >> Zosyn 12/22 >>  Dose adjustments this admission:  Microbiology results: 12/17 MRSA - NEG 12/17 Blood - NEG 12/17 TA - normal flora 12/22 TA:    Thank you for allowing pharmacy to be a part of this patient's care.  Sloan Leiter, PharmD, BCPS Clinical Pharmacist [phone] 01/29/2016 12:51 PM

## 2016-01-29 NOTE — Progress Notes (Signed)
Nutrition Follow-up  DOCUMENTATION CODES:   Morbid obesity  INTERVENTION:    Continue Vital High Protein at 70 ml/h (1680 ml per day) and Prostat 30 ml QID to provide 2080 kcals, 207 gm protein, 1404 ml free water daily.  Bowel regimen (no BM documented since admission)  NUTRITION DIAGNOSIS:   Inadequate oral intake related to inability to eat as evidenced by NPO status.  Ongoing  GOAL:   Provide needs based on ASPEN/SCCM guidelines   Met  MONITOR:   Vent status, Labs, TF tolerance, I & O's  ASSESSMENT:   65 yo male brought to ER 12/17 with generalized weakness. ABG showed hypercapnia. He was tried on Bipap w/o significant improvement. CXR showed pulmonary edema. Required intubation in ER.  Remains on CRRT. Currently receiving Vital High Protein via OGT at 70 ml/h (1680 ml per day) and Prostat 30 ml QID to provide 2080 kcals, 207 gm protein, 1404 ml free water daily. Tolerating well to meet nutrition goals. Patient is currently intubated on ventilator support Temp (24hrs), Avg:98.5 F (36.9 C), Min:97.7 F (36.5 C), Max:99 F (37.2 C)  Labs reviewed: potassium & phosphorus WNL; magnesium elevated. Medications reviewed and include Solu-cortef.  Diet Order:  Diet NPO time specified  Skin:  Reviewed, no issues  Last BM:  unknown  Height:   Ht Readings from Last 1 Encounters:  01/23/2016 _0  (1.854 m)    Weight:   Wt Readings from Last 1 Encounters:  01/29/16 (!) 324 lb 8.3 oz (147.2 kg)    Ideal Body Weight:  83.6 kg  BMI:  Body mass index is 42.81 kg/m.  Estimated Nutritional Needs:   Kcal:  5056-9794  Protein:  209 gm  Fluid:  2.2 L  EDUCATION NEEDS:   No education needs identified at this time  Molli Barrows, [place], [place], [place] Pager [phone] After Hours Pager [phone]

## 2016-01-29 NOTE — Progress Notes (Signed)
PCCM Admission Note  Admission date: 01/13/2016 Referring provider: Dr. Sherry Ruffing, ER  CC: feels weak  HPI: 65 yo male brought to ER 12/17 with generalized weakness.  Intubated for hypoxia/ hypercarbia & hypothermia 89 .He has CKD 5- biopsy proven FSGS  and CXR showed pulmonary edema.    Events:  12/17 admit, renal consulted 12/18 CRRT 12/19 Severe agitation   Pulled HD cath out.  12/20 seroquel + klonopin added  Subjective/Interval Hx:  Remains critically ill  On levophed 15mcg,  on CVVHD - pulling 110ml/hr  Either too agitated or too sedate on fent gtt, versed off   Vital signs: BP (!) 83/55   Pulse 84   Temp 98.1 F (36.7 C) (Oral)   Resp 20   Ht 6\' 1"  (1.854 m)   Wt (!) 324 lb 8.3 oz (147.2 kg)   SpO2 91%   BMI 42.81 kg/m   Intake/output: I/O last 3 completed shifts: In: 4487.5 [I.V.:1707.5; NG/GT:2730; IV Piggyback:50] Out: 8010 [Urine:85; Other:7925]  General: sedated, NAD on vent, CVVHD ongoing Neuro: RASS -3,  HEENT: ETT , no pallor, icterus Cardiac: regular, no murmur Chest: resps even non labored on vent, coarse, few scattered crackles  Abd: obese, soft, non tender, decreased bowel sounds Ext: 1+ edema, AVF L arm - bruitt Skin: venous stasis changes  CMP Latest Ref Rng & Units 01/29/2016 01/28/2016 01/28/2016  Glucose 65 - 99 mg/dL 116(H) 144(H) 129(H)  BUN 6 - 20 mg/dL 37(H) 33(H) 40(H)  Creatinine 0.61 - 1.24 mg/dL 2.66(H) 2.36(H) 2.68(H)  Sodium 135 - 145 mmol/L 138 138 137  Potassium 3.5 - 5.1 mmol/L 4.8 4.6 4.4  Chloride 101 - 111 mmol/L 101 102 103  CO2 22 - 32 mmol/L 26 25 24   Calcium 8.9 - 10.3 mg/dL 9.5 9.3 9.2  Total Protein 6.5 - 8.1 g/dL - - -  Total Bilirubin 0.3 - 1.2 mg/dL - - -  Alkaline Phos 38 - 126 U/L - - -  AST 15 - 41 U/L - - -  ALT 17 - 63 U/L - - -    CBC Latest Ref Rng & Units 01/29/2016 01/27/2016 01/26/2016  WBC 4.0 - 10.5 K/uL 11.5(H) 9.3 8.4  Hemoglobin 13.0 - 17.0 g/dL 7.9(L) 8.2(L) 8.2(L)  Hematocrit 39.0 - 52.0 %  26.2(L) 26.7(L) 26.6(L)  Platelets 150 - 400 K/uL 152 143(L) 173    ABG    Component Value Date/Time   PHART 7.328 (L) 01/25/2016 0345   PCO2ART 48.5 (H) 01/25/2016 0345   PO2ART 90.4 01/25/2016 0345   HCO3 24.7 01/25/2016 0345   TCO2 25 01/18/2016 1647   ACIDBASEDEF 0.5 01/25/2016 0345   O2SAT 95.5 01/25/2016 0345    CBG (last 3)   Recent Labs  01/28/16 2347 01/29/16 0353 01/29/16 0837  GLUCAP 108* 103* 121*     Imaging: Dg Chest Port 1 View  Result Date: 01/29/2016 CLINICAL DATA:  Initial evaluation for acute respiratory failure. EXAM: PORTABLE CHEST 1 VIEW COMPARISON:  Prior radiograph from 01/27/2016. FINDINGS: Endotracheal tube in place, with tip located 4.9 cm above the carina. Enteric tube courses in the the abdomen. Left IJ approach central venous catheter in stable position overlying the left innominate vein. Cardiomegaly stable. Mediastinal silhouette within normal limits. Lungs hypoinflated. Veiling opacities overlying the bilateral hemidiaphragms compatible with effusions. Superimposed pulmonary edema is relatively similar. Bibasilar opacities favored to reflect atelectasis/ edema. No pneumothorax. Osseous structures unchanged. IMPRESSION: 1. Tip of the endotracheal tube 4.9 cm above the carina. Remaining support apparatus as  above. 2. Persistent pulmonary edema with bilateral pleural effusions, relatively similar to previous. Bibasilar opacities favored to reflect atelectasis Electronically Signed   By: Jeannine Boga M.D.   On: 01/29/2016 06:34    Studies: CT head 12/17 >>neg acute  Echo >> 01/17/2016>> EF 123456, Grade 1 diastolic dysfunction  Cultures: Blood 12/17 >>ngtd>>> Sputum 12/17 >>normal flora   Antitibiotics:  Lines/tubes:  ETT 12/17 >> HD cath R Femoral >> 12/19 Left IJ HD 12/19>>>    ASSESSMENT / PLAN:  Shock of unclear etiology, ? Relative adrenal insuff, now on CRRT  PULMONARY Acute hypoxic/hypercapnic respiratory failure in  setting of pulmonary edema. P:   Daily SBT once agitation controlled Wean FiO2 as able drop peep 5     CARDIOVASCULAR Pulmonary edema  Hx of HTN, HLD. Hypothermia resolving EF>> 60-65% ( 12/18), no pericardial efusion Hypotension ?adrenal insuff vs sedation/ vent related P:  Continue low dose levophed as needed to maintain MAP and allow volume removal  hold home norvasc, lasix, crestor, toprol Cortisol=9.8 - Continue stress steroids   RENAL ESRD , now on CRRT, has AVF Goal hourly net neg 100 cc / hr P:   F/u chem  Strict I&O Renal following ,CVVHD ongoing   GASTROINTESTINAL Constipation P:   PPI  NPO TF at goal   HEMATOLOGIC Anemia of chronic disease P:  F/u CBC SCD's  Transfuse for HGB >7 Trend coags as needed  INFECTIOUS ETT secretions P:   Add empiric zosyn vanc Pct although ? reliable in ESRD  ENDOCRINE DM   P:   SSI   NEUROLOGIC Acute metabolic encephalopathy - CT head neg  Agitation  P:   RASS goal 0 to -1 Continue to hold outpt neurontin  fentanyl gtt, dc versed gtt Daily WUA  Add clonazepam 1 daily and increase seroquel 100 BID and attempt wean gtts   The patient is critically ill with multiple organ systems failure and requires high complexity decision making for assessment and support, frequent evaluation and titration of therapies, application of advanced monitoring technologies and extensive interpretation of multiple databases. Critical Care Time devoted to patient care services described in this note independent of APP time is 35 minutes.   Kara Mead MD. Shade Flood. [facility] Pager [phone] If no response call [phone]     01/29/2016  12:12 PM

## 2016-01-30 ENCOUNTER — Inpatient Hospital Stay (HOSPITAL_COMMUNITY): Payer: Medicare Other

## 2016-01-30 DIAGNOSIS — G9340 Encephalopathy, unspecified: Secondary | ICD-10-CM

## 2016-01-30 LAB — RENAL FUNCTION PANEL
ANION GAP: 11 (ref 5–15)
Albumin: 2.6 g/dL — ABNORMAL LOW (ref 3.5–5.0)
Albumin: 2.4 g/dL — ABNORMAL LOW (ref 3.5–5.0)
Anion gap: 12 (ref 5–15)
BUN: 35 mg/dL — ABNORMAL HIGH (ref 6–20)
BUN: 39 mg/dL — ABNORMAL HIGH (ref 6–20)
CALCIUM: 9.6 mg/dL (ref 8.9–10.3)
CHLORIDE: 100 mmol/L — AB (ref 101–111)
CHLORIDE: 100 mmol/L — AB (ref 101–111)
CO2: 25 mmol/L (ref 22–32)
CO2: 24 mmol/L (ref 22–32)
CREATININE: 2.97 mg/dL — AB (ref 0.61–1.24)
CREATININE: 2.55 mg/dL — AB (ref 0.61–1.24)
Calcium: 9.9 mg/dL (ref 8.9–10.3)
GFR calc Af Amer: 24 mL/min — ABNORMAL LOW (ref >60)
GFR calc non Af Amer: 20 mL/min — ABNORMAL LOW (ref >60)
GFR, EST AFRICAN AMERICAN: 28 mL/min — AB (ref >60)
GFR, EST NON AFRICAN AMERICAN: 24 mL/min — AB (ref >60)
Glucose, Bld: 127 mg/dL — ABNORMAL HIGH (ref 65–99)
Glucose, Bld: 256 mg/dL — ABNORMAL HIGH (ref 65–99)
POTASSIUM: 4.6 mmol/L (ref 3.5–5.1)
Phosphorus: 3.9 mg/dL (ref 2.5–4.6)
Phosphorus: 4.5 mg/dL (ref 2.5–4.6)
Potassium: 5 mmol/L (ref 3.5–5.1)
SODIUM: 135 mmol/L (ref 135–145)
Sodium: 137 mmol/L (ref 135–145)

## 2016-01-30 LAB — CBC
HCT: 26.1 % — ABNORMAL LOW (ref 39.0–52.0)
HEMOGLOBIN: 8 g/dL — AB (ref 13.0–17.0)
MCH: 28.3 pg (ref 26.0–34.0)
MCHC: 30.7 g/dL (ref 30.0–36.0)
MCV: 92.2 fL (ref 78.0–100.0)
PLATELETS: 167 10*3/uL (ref 150–400)
RBC: 2.83 MIL/uL — AB (ref 4.22–5.81)
RDW: 18.7 % — ABNORMAL HIGH (ref 11.5–15.5)
WBC: 12 10*3/uL — AB (ref 4.0–10.5)

## 2016-01-30 LAB — GLUCOSE, CAPILLARY
GLUCOSE-CAPILLARY: 133 mg/dL — AB (ref 65–99)
GLUCOSE-CAPILLARY: 174 mg/dL — AB (ref 65–99)
Glucose-Capillary: 122 mg/dL — ABNORMAL HIGH (ref 65–99)
Glucose-Capillary: 233 mg/dL — ABNORMAL HIGH (ref 65–99)
Glucose-Capillary: 243 mg/dL — ABNORMAL HIGH (ref 65–99)

## 2016-01-30 LAB — MAGNESIUM: Magnesium: 2.7 mg/dL — ABNORMAL HIGH (ref 1.7–2.4)

## 2016-01-30 LAB — PROCALCITONIN: Procalcitonin: 0.36 ng/mL

## 2016-01-30 LAB — APTT: aPTT: 29 seconds (ref 24–36)

## 2016-01-30 MED ORDER — BISACODYL 10 MG RE SUPP
10.0000 mg | Freq: Once | RECTAL | Status: AC
  Administered 2016-01-30: 10 mg via RECTAL
  Filled 2016-01-30: qty 1

## 2016-01-30 MED ORDER — LACTULOSE 10 GM/15ML PO SOLN
30.0000 g | Freq: Once | ORAL | Status: AC
  Administered 2016-01-30: 30 g
  Filled 2016-01-30: qty 45

## 2016-01-30 MED ORDER — WHITE PETROLATUM GEL
Status: AC
  Administered 2016-01-30: 17:00:00
  Filled 2016-01-30: qty 1

## 2016-01-30 MED ORDER — CLONAZEPAM 0.5 MG PO TABS
1.0000 mg | ORAL_TABLET | Freq: Two times a day (BID) | ORAL | Status: DC
  Administered 2016-01-30 – 2016-02-04 (×10): 1 mg
  Filled 2016-01-30 (×11): qty 2

## 2016-01-30 MED ORDER — QUETIAPINE FUMARATE 200 MG PO TABS
200.0000 mg | ORAL_TABLET | Freq: Two times a day (BID) | ORAL | Status: DC
  Administered 2016-01-30: 200 mg via ORAL
  Administered 2016-01-30: 100 mg via ORAL
  Administered 2016-01-31 – 2016-02-09 (×15): 200 mg via ORAL
  Filled 2016-01-30 (×21): qty 1

## 2016-01-30 MED ORDER — QUETIAPINE FUMARATE 100 MG PO TABS
100.0000 mg | ORAL_TABLET | Freq: Once | ORAL | Status: AC
  Administered 2016-01-30: 100 mg via ORAL
  Filled 2016-01-30: qty 1

## 2016-01-30 NOTE — Progress Notes (Signed)
Patient seen receiving CRRT  Some hypotension and so have kept even  -- no additional fluid removal  Severe agitation continues  Hb 8.0  K 4.6   Bicarbonate 25   Calcium 9.9  Phos  3.9   Magnesium  2.7    Started on Vancomycin                       Zosyn                                    Empiric treatment

## 2016-01-30 NOTE — Progress Notes (Signed)
PCCM Admission Note  Admission date: 01/18/2016 Referring provider: Dr. Sherry Ruffing, ER  CC: feels weak  HPI: 65 yo Miguel [name] brought to ER 12/17 with generalized weakness.  Intubated for hypoxia/ hypercarbia & hypothermia 89 .He has CKD 5- biopsy proven FSGS  and CXR showed pulmonary edema.    Events:  12/17 admit, renal consulted 12/18 CRRT 12/19 Severe agitation   Pulled HD cath out.  12/20 seroquel + klonopin added  Subjective/Interval Hx:  Remains critically ill  , levophed up to 12 mcg,  on CVVHD - pulling 113ml/hr  Either too agitated or too sedate on lower  fent gtt  Vital signs: BP (!) 98/59   Pulse 91   Temp 98.5 F (36.9 C) (Oral)   Resp 20   Ht 6\' 1"  (1.854 m)   Wt (!) 323 lb 13.7 oz (146.9 kg)   SpO2 99%   BMI 42.73 kg/m   Intake/output: I/O last 3 completed shifts: In: 5233.3 [I.V.:1793.3; NG/GT:2690; IV Piggyback:750] Out: [id number] [Urine:60; Other:7961]  General: sedated, NAD on vent, CVVHD ongoing Neuro: RASS -3, on fent gtt HEENT: ETT , no pallor, icterus Cardiac: regular, no murmur Chest: resps even non labored on vent, coarse, few scattered crackles  Abd: obese, soft, non tender, decreased bowel sounds Ext: 1+ edema, AVF L arm - bruitt Skin: venous stasis changes  CMP Latest Ref Rng & Units 01/30/2016 01/29/2016 01/29/2016  Glucose 65 - 99 mg/dL 127(H) 135(H) 116(H)  BUN 6 - 20 mg/dL 35(H) 37(H) 37(H)  Creatinine 0.61 - 1.24 mg/dL 2.97(H) 2.52(H) 2.66(H)  Sodium 135 - 145 mmol/L 137 138 138  Potassium 3.5 - 5.1 mmol/L 4.6 4.8 4.8  Chloride 101 - 111 mmol/L 100(L) 102 101  CO2 22 - 32 mmol/L 25 25 26   Calcium 8.9 - 10.3 mg/dL 9.9 9.1 9.5  Total Protein 6.5 - 8.1 g/dL - - -  Total Bilirubin 0.3 - 1.2 mg/dL - - -  Alkaline Phos 38 - 126 U/L - - -  AST 15 - 41 U/L - - -  ALT 17 - 63 U/L - - -    CBC Latest Ref Rng & Units 01/30/2016 01/29/2016 01/27/2016  WBC 4.0 - 10.5 K/uL 12.0(H) 11.5(H) 9.3  Hemoglobin 13.0 - 17.0 g/dL 8.0(L) 7.9(L) 8.2(L)   Hematocrit 39.0 - 52.0 % 26.1(L) 26.2(L) 26.7(L)  Platelets 150 - 400 K/uL 167 152 143(L)    ABG    Component Value Date/Time   PHART 7.328 (L) 01/25/2016 0345   PCO2ART 48.5 (H) 01/25/2016 0345   PO2ART 90.4 01/25/2016 0345   HCO3 24.7 01/25/2016 0345   TCO2 25 01/28/2016 1647   ACIDBASEDEF 0.5 01/25/2016 0345   O2SAT 95.5 01/25/2016 0345    CBG (last 3)   Recent Labs  01/29/16 2325 01/30/16 0336 01/30/16 0724  GLUCAP 133* 133* 122*     Imaging: Dg Chest Port 1 View  Result Date: 01/30/2016 CLINICAL DATA:  Acute respiratory failure. EXAM: PORTABLE CHEST 1 VIEW COMPARISON:  01/29/2016 FINDINGS: Patient is rotated to the right. Endotracheal tube has tip 6.5 cm above the carina. Nasogastric tube courses into the region of the stomach and off the inferior portion of the film as tip is not visualized. Left IJ central venous catheter unchanged. Lungs are somewhat hypoinflated with persistent hazy bibasilar opacification likely effusions/atelectasis. There is improved perihilar opacification. Stable cardiomegaly. Remainder of the exam is unchanged. IMPRESSION: Hazy bibasilar opacification slightly improved likely effusions/atelectasis, although cannot exclude infection. Stable cardiomegaly. Tubes and lines as described. Electronically  Signed   By: Marin Olp M.D.   On: 01/30/2016 08:51   Dg Chest Port 1 View  Result Date: 01/29/2016 CLINICAL DATA:  Initial evaluation for acute respiratory failure. EXAM: PORTABLE CHEST 1 VIEW COMPARISON:  Prior radiograph from 01/27/2016. FINDINGS: Endotracheal tube in place, with tip located 4.9 cm above the carina. Enteric tube courses in the the abdomen. Left IJ approach central venous catheter in stable position overlying the left innominate vein. Cardiomegaly stable. Mediastinal silhouette within normal limits. Lungs hypoinflated. Veiling opacities overlying the bilateral hemidiaphragms compatible with effusions. Superimposed pulmonary edema is  relatively similar. Bibasilar opacities favored to reflect atelectasis/ edema. No pneumothorax. Osseous structures unchanged. IMPRESSION: 1. Tip of the endotracheal tube 4.9 cm above the carina. Remaining support apparatus as above. 2. Persistent pulmonary edema with bilateral pleural effusions, relatively similar to previous. Bibasilar opacities favored to reflect atelectasis Electronically Signed   By: Jeannine Boga M.D.   On: 01/29/2016 06:34    Studies: CT head 12/17 >>neg acute  Echo >> 02/05/2016>> EF 123456, Grade 1 diastolic dysfunction  Cultures: Blood 12/17 >>ngtd>>> Sputum 12/17 >>normal flora   Antitibiotics:  Lines/tubes:  ETT 12/17 >> HD cath R Femoral >> 12/19 Left IJ HD 12/19>>>    ASSESSMENT / PLAN:  Shock of unclear etiology, ? Relative adrenal insuff, now on CRRT  PULMONARY Acute hypoxic/hypercapnic respiratory failure in setting of pulmonary edema. P:   Daily SBT once agitation controlled FiO2 /peep down  CARDIOVASCULAR Pulmonary edema  Hx of HTN, HLD. Hypothermia resolving EF>> 60-65% ( 12/18), no pericardial efusion Hypotension ?adrenal insuff vs sedation/ vent related P:  Continue low dose levophed as needed to maintain MAP and allow volume removal  hold home norvasc, lasix, crestor, toprol Cortisol=9.8 - Continue stress steroids   RENAL ESRD , now on CRRT, has AVF Goal hourly net neg 100 cc / hr P:   F/u chem  Strict I&O Renal following ,CVVHD ongoing,keep even since levophed going up   GASTROINTESTINAL Constipation P:   PPI  NPO TF at goal   HEMATOLOGIC Anemia of chronic disease P:  F/u CBC SCD's  Transfuse for HGB >7 Trend coags as needed  INFECTIOUS ETT secretions P:   ct empiric zosyn vanc until resp cx back Pct although ? reliable in ESRD  ENDOCRINE DM   P:   SSI   NEUROLOGIC Acute metabolic encephalopathy - CT head neg  Agitation  P:   RASS goal 0 to -1 Continue to hold outpt neurontin  fentanyl  gtt, dc versed gtt Daily WUA  Titrate clonazepam 1 bid  and increase seroquel 200 BID and attempt wean gtts    Updated daughter 12/23  The patient is critically ill with multiple organ systems failure and requires high complexity decision making for assessment and support, frequent evaluation and titration of therapies, application of advanced monitoring technologies and extensive interpretation of multiple databases. Critical Care Time devoted to patient care services described in this note independent of APP time is 35 minutes.   Kara Mead MD. Shade Flood. [facility] Pager [phone] If no response call [phone]     01/30/2016  9:44 AM

## 2016-01-31 ENCOUNTER — Inpatient Hospital Stay (HOSPITAL_COMMUNITY): Payer: Medicare Other

## 2016-01-31 LAB — RENAL FUNCTION PANEL
ALBUMIN: 2.2 g/dL — AB (ref 3.5–5.0)
ANION GAP: 13 (ref 5–15)
ANION GAP: 9 (ref 5–15)
Albumin: 2.4 g/dL — ABNORMAL LOW (ref 3.5–5.0)
BUN: 37 mg/dL — ABNORMAL HIGH (ref 6–20)
BUN: 44 mg/dL — ABNORMAL HIGH (ref 6–20)
CALCIUM: 10 mg/dL (ref 8.9–10.3)
CO2: 24 mmol/L (ref 22–32)
CO2: 26 mmol/L (ref 22–32)
CREATININE: 2.34 mg/dL — AB (ref 0.61–1.24)
Calcium: 9.4 mg/dL (ref 8.9–10.3)
Chloride: 99 mmol/L — ABNORMAL LOW (ref 101–111)
Chloride: 101 mmol/L (ref 101–111)
Creatinine, Ser: 2.32 mg/dL — ABNORMAL HIGH (ref 0.61–1.24)
GFR calc Af Amer: 32 mL/min — ABNORMAL LOW (ref >60)
GFR calc non Af Amer: 27 mL/min — ABNORMAL LOW (ref >60)
GFR, EST AFRICAN AMERICAN: 31 mL/min — AB (ref >60)
GFR, EST NON AFRICAN AMERICAN: 27 mL/min — AB (ref >60)
GLUCOSE: 133 mg/dL — AB (ref 65–99)
Glucose, Bld: 168 mg/dL — ABNORMAL HIGH (ref 65–99)
PHOSPHORUS: 5.4 mg/dL — AB (ref 2.5–4.6)
POTASSIUM: 4.9 mmol/L (ref 3.5–5.1)
Phosphorus: 4.7 mg/dL — ABNORMAL HIGH (ref 2.5–4.6)
Potassium: 4.9 mmol/L (ref 3.5–5.1)
SODIUM: 136 mmol/L (ref 135–145)
SODIUM: 136 mmol/L (ref 135–145)

## 2016-01-31 LAB — PROCALCITONIN: PROCALCITONIN: 0.44 ng/mL

## 2016-01-31 LAB — CBC
HCT: 27.6 % — ABNORMAL LOW (ref 39.0–52.0)
HEMOGLOBIN: 8.3 g/dL — AB (ref 13.0–17.0)
MCH: 28.1 pg (ref 26.0–34.0)
MCHC: 30.1 g/dL (ref 30.0–36.0)
MCV: 93.6 fL (ref 78.0–100.0)
Platelets: 180 10*3/uL (ref 150–400)
RBC: 2.95 MIL/uL — ABNORMAL LOW (ref 4.22–5.81)
RDW: 18.9 % — ABNORMAL HIGH (ref 11.5–15.5)
WBC: 15 10*3/uL — ABNORMAL HIGH (ref 4.0–10.5)

## 2016-01-31 LAB — MAGNESIUM: Magnesium: 2.8 mg/dL — ABNORMAL HIGH (ref 1.7–2.4)

## 2016-01-31 LAB — GLUCOSE, CAPILLARY
GLUCOSE-CAPILLARY: 164 mg/dL — AB (ref 65–99)
GLUCOSE-CAPILLARY: 173 mg/dL — AB (ref 65–99)
GLUCOSE-CAPILLARY: 204 mg/dL — AB (ref 65–99)
Glucose-Capillary: 146 mg/dL — ABNORMAL HIGH (ref 65–99)
Glucose-Capillary: 146 mg/dL — ABNORMAL HIGH (ref 65–99)
Glucose-Capillary: 167 mg/dL — ABNORMAL HIGH (ref 65–99)
Glucose-Capillary: 199 mg/dL — ABNORMAL HIGH (ref 65–99)

## 2016-01-31 LAB — CULTURE, RESPIRATORY W GRAM STAIN: Culture: NORMAL

## 2016-01-31 LAB — APTT: aPTT: 31 seconds (ref 24–36)

## 2016-01-31 MED ORDER — NOREPINEPHRINE BITARTRATE 1 MG/ML IV SOLN
2.0000 ug/min | INTRAVENOUS | Status: DC
  Administered 2016-01-31: 12 ug/min via INTRAVENOUS
  Administered 2016-02-01 (×2): 5.973 ug/min via INTRAVENOUS
  Administered 2016-02-02: 10 ug/min via INTRAVENOUS
  Administered 2016-02-03: 20 ug/min via INTRAVENOUS
  Administered 2016-02-05: 7 ug/min via INTRAVENOUS
  Administered 2016-02-06: 8 ug/min via INTRAVENOUS
  Administered 2016-02-07: 10 ug/min via INTRAVENOUS
  Administered 2016-02-07: 8 ug/min via INTRAVENOUS
  Administered 2016-02-08: 14 ug/min via INTRAVENOUS
  Filled 2016-01-31 (×10): qty 16

## 2016-01-31 NOTE — Progress Notes (Signed)
PCCM Admission Note  Admission date: 01/09/2016 Referring provider: Dr. Sherry Ruffing, ER  CC: feels weak  HPI: 65 yo male brought to ER 12/17 with generalized weakness.  Intubated for hypoxia/ hypercarbia & hypothermia 89 .He has CKD 5- biopsy proven FSGS  and CXR showed pulmonary edema.    Events:  12/17 admit, renal consulted 12/18 CRRT 12/19 Severe agitation   Pulled HD cath out.  12/20 seroquel + klonopin added  Subjective/Interval Hx:  Remains critically ill  , levophed gtt lower on CVVHD Either too agitated or too sedate on lower  fent gtt  Vital signs: BP (!) 145/76   Pulse 74   Temp 98.4 F (36.9 C) (Oral)   Resp 20   Ht 6\' 1"  (1.854 m)   Wt (!) 307 lb 15.7 oz (139.7 kg)   SpO2 94%   BMI 40.63 kg/m   Intake/output: I/O last 3 completed shifts: In: 5835.1 [I.V.:2295.1; NG/GT:2640; IV [id number] Out: 5365 [Urine:20; [id number]; Stool:3]  General: sedated, NAD on vent, CVVHD ongoing Neuro: RASS -3, on fent gtt HEENT: ETT , no pallor, icterus Cardiac: regular, no murmur Chest: resps even non labored on vent, coarse, few scattered crackles  Abd: obese, soft, non tender, decreased bowel sounds Ext: 1+ edema, AVF L arm - bruitt Skin: venous stasis changes  CMP Latest Ref Rng & Units 01/31/2016 01/30/2016 01/30/2016  Glucose 65 - 99 mg/dL 133(H) 256(H) 127(H)  BUN 6 - 20 mg/dL 37(H) 39(H) 35(H)  Creatinine 0.61 - 1.24 mg/dL 2.32(H) 2.55(H) 2.97(H)  Sodium 135 - 145 mmol/L 136 135 137  Potassium 3.5 - 5.1 mmol/L 4.9 5.0 4.6  Chloride 101 - 111 mmol/L 99(L) 100(L) 100(L)  CO2 22 - 32 mmol/L 24 24 25   Calcium 8.9 - 10.3 mg/dL 10.0 9.6 9.9  Total Protein 6.5 - 8.1 g/dL - - -  Total Bilirubin 0.3 - 1.2 mg/dL - - -  Alkaline Phos 38 - 126 U/L - - -  AST 15 - 41 U/L - - -  ALT 17 - 63 U/L - - -    CBC Latest Ref Rng & Units 01/31/2016 01/30/2016 01/29/2016  WBC 4.0 - 10.5 K/uL 15.0(H) 12.0(H) 11.5(H)  Hemoglobin 13.0 - 17.0 g/dL 8.3(L) 8.0(L) 7.9(L)   Hematocrit 39.0 - 52.0 % 27.6(L) 26.1(L) 26.2(L)  Platelets 150 - 400 K/uL 180 167 152    ABG    Component Value Date/Time   PHART 7.328 (L) 01/25/2016 0345   PCO2ART 48.5 (H) 01/25/2016 0345   PO2ART 90.4 01/25/2016 0345   HCO3 24.7 01/25/2016 0345   TCO2 25 01/28/2016 1647   ACIDBASEDEF 0.5 01/25/2016 0345   O2SAT 95.5 01/25/2016 0345    CBG (last 3)   Recent Labs  01/30/16 2346 01/31/16 0343 01/31/16 0802  GLUCAP 204* 146* 146*     Imaging: Dg Chest Port 1 View  Result Date: 01/31/2016 CLINICAL DATA:  Acute respiratory failure, diabetes mellitus, hypertension, chronic kidney disease EXAM: PORTABLE CHEST 1 VIEW COMPARISON:  Portable exam 0551 hours compared to 01/30/2016 FINDINGS: Tip of endotracheal tube projects 6.6 cm above carina. Nasogastric tube extends below extent of chest radiograph. LEFT jugular central venous catheter with tip projecting over LEFT brachiocephalic vein. Rotated to the RIGHT. Bibasilar lung opacification question atelectasis versus consolidation. Generally low lung volumes. No gross pneumothorax or pleural effusion. IMPRESSION: Low lung volumes with bibasilar atelectasis versus consolidation. Electronically Signed   By: Lavonia Dana M.D.   On: 01/31/2016 08:19   Dg Chest [facility]  Result Date: 01/30/2016 CLINICAL DATA:  Acute respiratory failure. EXAM: PORTABLE CHEST 1 VIEW COMPARISON:  01/29/2016 FINDINGS: Patient is rotated to the right. Endotracheal tube has tip 6.5 cm above the carina. Nasogastric tube courses into the region of the stomach and off the inferior portion of the film as tip is not visualized. Left IJ central venous catheter unchanged. Lungs are somewhat hypoinflated with persistent hazy bibasilar opacification likely effusions/atelectasis. There is improved perihilar opacification. Stable cardiomegaly. Remainder of the exam is unchanged. IMPRESSION: Hazy bibasilar opacification slightly improved likely effusions/atelectasis,  although cannot exclude infection. Stable cardiomegaly. Tubes and lines as described. Electronically Signed   By: Marin Olp M.D.   On: 01/30/2016 08:51    Studies: CT head 12/17 >>neg acute  Echo >> 01/20/2016>> EF 123456, Grade 1 diastolic dysfunction  Cultures: Blood 12/17 >>ngtd>>> Sputum 12/17 >>normal flora   Antitibiotics:  Lines/tubes:  ETT 12/17 >> HD cath R Femoral >> 12/19 Left IJ HD 12/19>>>    ASSESSMENT / PLAN:  Shock of unclear etiology, ? Relative adrenal insuff, now on CRRT  PULMONARY Acute hypoxic/hypercapnic respiratory failure in setting of pulmonary edema. P:   Daily SBT once agitation controlled FiO2 /peep down  CARDIOVASCULAR Pulmonary edema  Hx of HTN, HLD. Hypothermia resolving EF>> 60-65% ( 12/18), no pericardial efusion Hypotension ?adrenal insuff vs sedation/ vent related P:  Continue low dose levophed as needed to maintain MAP and allow volume removal  hold home norvasc, lasix, crestor, toprol - Continue stress steroids   RENAL ESRD , now on CRRT, has AVF Goal hourly net neg 100 cc / hr P:   F/u chem  Strict I&O Renal following ,CVVHD ongoing,keep even since on levophed    GASTROINTESTINAL Constipation P:   PPI  NPO TF at goal   HEMATOLOGIC Anemia of chronic disease P:  F/u CBC SCD's  Transfuse for HGB >7 Trend coags as needed  INFECTIOUS ETT secretions P:   ct empiric zosyn vanc until resp cx back Pct low reassuring  ENDOCRINE DM   Cortisol=9.8  P:   SSI   NEUROLOGIC Acute metabolic encephalopathy - CT head neg  Agitation  P:   RASS goal 0 to -1 Continue to hold outpt neurontin  Minimise fentanyl gtt, dc versed gtt Daily WUA  Titrate clonazepam 1 bid  and increase seroquel 200 BID and attempt wean gtts    Updated daughter 12/23  The patient is critically ill with multiple organ systems failure and requires high complexity decision making for assessment and support, frequent evaluation and  titration of therapies, application of advanced monitoring technologies and extensive interpretation of multiple databases. Critical Care Time devoted to patient care services described in this note independent of APP time is 32 minutes.   Kara Mead MD. Shade Flood. [place] Pager [phone] If no response call [phone]     01/31/2016  11:11 AM

## 2016-01-31 NOTE — Progress Notes (Signed)
65 yo male brought to ER 12/17 with generalized weakness.  Intubated for hypoxia/ hypercarbia & hypothermia 89 .He has CKD 5- biopsy proven FSGS  and CXR showed pulmonary edema.    K 4.9    Bicarbonate 24       Hb 8.3  Continues to be kept even   Treated with Vancomycin and Zosyn

## 2016-02-01 ENCOUNTER — Inpatient Hospital Stay (HOSPITAL_COMMUNITY): Payer: Medicare Other

## 2016-02-01 DIAGNOSIS — J189 Pneumonia, unspecified organism: Secondary | ICD-10-CM

## 2016-02-01 DIAGNOSIS — J80 Acute respiratory distress syndrome: Secondary | ICD-10-CM

## 2016-02-01 LAB — POCT I-STAT 3, ART BLOOD GAS (G3+)
ACID-BASE EXCESS: 3 mmol/L — AB (ref 0.0–2.0)
Acid-Base Excess: 1 mmol/L (ref 0.0–2.0)
Bicarbonate: 25.4 mmol/L (ref 20.0–28.0)
Bicarbonate: 28.7 mmol/L — ABNORMAL HIGH (ref 20.0–28.0)
O2 SAT: 88 %
O2 Saturation: 99 %
PCO2 ART: 32.9 mmHg (ref 32.0–48.0)
PH ART: 7.396 (ref 7.350–7.450)
PO2 ART: 43 mmHg — AB (ref 83.0–108.0)
Patient temperature: 93.9
TCO2: 26 mmol/L (ref 0–100)
TCO2: 30 mmol/L (ref 0–100)
pCO2 arterial: 46.5 mmHg (ref 32.0–48.0)
pH, Arterial: 7.485 — ABNORMAL HIGH (ref 7.350–7.450)
pO2, Arterial: 133 mmHg — ABNORMAL HIGH (ref 83.0–108.0)

## 2016-02-01 LAB — GLUCOSE, CAPILLARY
GLUCOSE-CAPILLARY: 145 mg/dL — AB (ref 65–99)
GLUCOSE-CAPILLARY: 140 mg/dL — AB (ref 65–99)
Glucose-Capillary: 158 mg/dL — ABNORMAL HIGH (ref 65–99)
Glucose-Capillary: 158 mg/dL — ABNORMAL HIGH (ref 65–99)
Glucose-Capillary: 146 mg/dL — ABNORMAL HIGH (ref 65–99)

## 2016-02-01 LAB — RENAL FUNCTION PANEL
Albumin: 2.3 g/dL — ABNORMAL LOW (ref 3.5–5.0)
Anion gap: 12 (ref 5–15)
BUN: 46 mg/dL — ABNORMAL HIGH (ref 6–20)
CALCIUM: 9.7 mg/dL (ref 8.9–10.3)
CO2: 25 mmol/L (ref 22–32)
CREATININE: 2.38 mg/dL — AB (ref 0.61–1.24)
Chloride: 100 mmol/L — ABNORMAL LOW (ref 101–111)
GFR, EST AFRICAN AMERICAN: 31 mL/min — AB (ref >60)
GFR, EST NON AFRICAN AMERICAN: 27 mL/min — AB (ref >60)
Glucose, Bld: 193 mg/dL — ABNORMAL HIGH (ref 65–99)
Phosphorus: 4.4 mg/dL (ref 2.5–4.6)
Potassium: 4.6 mmol/L (ref 3.5–5.1)
SODIUM: 137 mmol/L (ref 135–145)

## 2016-02-01 LAB — CBC
HCT: 25 % — ABNORMAL LOW (ref 39.0–52.0)
Hemoglobin: 7.7 g/dL — ABNORMAL LOW (ref 13.0–17.0)
MCH: 27.8 pg (ref 26.0–34.0)
MCHC: 30.8 g/dL (ref 30.0–36.0)
MCV: 90.3 fL (ref 78.0–100.0)
PLATELETS: 203 10*3/uL (ref 150–400)
RBC: 2.77 MIL/uL — AB (ref 4.22–5.81)
RDW: 18.4 % — ABNORMAL HIGH (ref 11.5–15.5)
WBC: 13 10*3/uL — AB (ref 4.0–10.5)

## 2016-02-01 LAB — APTT: APTT: 31 s (ref 24–36)

## 2016-02-01 LAB — MAGNESIUM: MAGNESIUM: 2.8 mg/dL — AB (ref 1.7–2.4)

## 2016-02-01 NOTE — Progress Notes (Signed)
Endotracheal tube advanced per order from 26 cm @ teeth to 29 cm, no complications, mouth sx.'d prior to advancement with subglottic sx. placed on during procedure b/l b.s. auscultated s/p., pt. tolerated well.

## 2016-02-01 NOTE — Progress Notes (Signed)
eLink Physician-Brief Progress Note Patient Name: Miguel [name] DOB: 1950/03/08 MRN: [id number]   Date of Service  02/01/2016  HPI/Events of Note  Worsening hypoxemia improved by increased peep and fio2 . abg below was before peeop/fio2 adjustment  cxr ett tube 6cm above carina   Recent Labs Lab 01/25/16 0345 02/01/16 0248  PHART 7.328* 7.485*  PCO2ART 48.5* 32.9  PO2ART 90.4 43.0*  HCO3 24.7 25.4  TCO2  --  26  O2SAT 95.5 88.0     eICU Interventions  Advance et tube     Intervention Category Major Interventions: Respiratory failure - evaluation and management  Teriann Livingood 02/01/2016, 3:25 AM

## 2016-02-01 NOTE — Progress Notes (Signed)
Pts. Oxygen saturations trending down t/o shift, obtained order for STAT ABG/after results received FIO2>'d to .80, along with PEEP >'d to 8.0 from 5.0, Dr. Lynford Citizen notified of results, awaiting orders.

## 2016-02-01 NOTE — Procedures (Signed)
65 yo male brought to ER 12/17 with generalized weakness.  Intubated for hypoxia/ hypercarbia & hypothermia 89 .He has CKD 5- biopsy proven FSGS  and CXR showed pulmonary edema.    Renal :  CRRT                      Keeping even     Hemodynamics :  Levophed low dose               PEEP /FiO2  Requirement increased   Worsening hypoxia                  Hb 7.7                   K  4.9     Bicarbonate  26       General: sedated, NAD on vent, CVVHD  HEENT: ETT , no pallor, icterus Cardiac: regular, no murmur Chest:   coarse, few scattered crackles breath sounds and no distress  Abd: obese, soft, non tender, decreased bowel sounds Ext: trace - 1+ edema, AVF L arm - bruitt Skin: venous stasis changes  ASSESSMENT/PLAN  Continues to be in shock with unclear etiology   Continues on empiric zosyn and vancomycin  Continue on CRRT   No anticoagulation and no issues with clotting changing daily  Line  HD Left I J   ---  12/19

## 2016-02-01 NOTE — Progress Notes (Signed)
Pharmacy Antibiotic Note  Miguel [name] is a 65 y.o. male admitted on 02/04/2016 with pneumonia.  Pharmacy has been consulted for Vancomycin and Zosyn dosing. Afebrile, WBC 13.   Plan: Continue vancomycin 1500mg  IV every 24 hours while on CRRT.  Zosyn 3.375g IV every 6 hours - each dose over 30 minutes.  Monitor culture results and clinical status.  Monitor CRRT plans   Height: 6\' 1"  (185.4 cm) Weight: (!) 304 lb 7.3 oz (138.1 kg) IBW/kg (Calculated) : 79.9  Temp (24hrs), Avg:95.9 F (35.5 C), Min:93.9 F (34.4 C), Max:98 F (36.7 C)   Recent Labs Lab 01/27/16 0514  01/29/16 0500 01/29/16 1505 01/30/16 0503 01/30/16 1600 01/31/16 0500 01/31/16 1600 02/01/16 0432  WBC 9.3  --  11.5*  --  12.0*  --  15.0*  --  13.0*  CREATININE 3.10*  < > 2.66* 2.52* 2.97* 2.55* 2.32* 2.34*  --   < > = values in this interval not displayed.  Estimated Creatinine Clearance: 44.7 mL/min (by C-G formula based on SCr of 2.34 mg/dL (H)).    No Known Allergies  Antimicrobials this admission: Vancomycin 12/22 >> Zosyn 12/22 >>  Dose adjustments this admission:  Microbiology results: 12/17 MRSA - NEG 12/17 Blood - NEG 12/17 TA - normal flora 12/22 TA: normal flora   Thank you for allowing pharmacy to be a part of this patient's care.  Angela [name], PharmD, BCPS Pharmacy Resident Pager: [phone] 02/01/2016 9:45 AM

## 2016-02-01 NOTE — Progress Notes (Signed)
PCCM Admission Note  Admission date: 02/06/2016 Referring provider: Dr. Sherry Ruffing, ER  CC: feels weak  HPI: 65 yo male brought to ER 12/17 with generalized weakness.  Intubated for hypoxia/ hypercarbia & hypothermia 89 .He has CKD 5- biopsy proven FSGS  and CXR showed pulmonary edema.    Events:  12/17 admit, renal consulted 12/18 CRRT 12/19 Severe agitation   Pulled HD cath out.  12/20 seroquel + klonopin added 12/25 worse hypoxia + BL ASD  -needing PEEP/ increase FIO2  Subjective/Interval Hx:  Remains critically ill  , levophed gtt On higher PEEP/FIO2 on CVVHD - on lower  fent gtt  Vital signs: BP 107/66   Pulse 71   Temp 97.6 F (36.4 C) (Oral)   Resp 20   Ht 6\' 1"  (1.854 m)   Wt (!) 304 lb 7.3 oz (138.1 kg)   SpO2 96%   BMI 40.17 kg/m   Intake/output: I/O last 3 completed shifts: In: 5215.4 [I.V.:1755.4; NG/GT:2610; IV Piggyback:850] Out: [id number] [Urine:30; [id number]; Stool:2]  General: sedated, NAD on vent, CVVHD  Neuro: RASS -3, on fent gtt HEENT: ETT , no pallor, icterus Cardiac: regular, no murmur Chest: resps even non labored on vent, coarse, few scattered crackles BL Abd: obese, soft, non tender, decreased bowel sounds Ext: 1+ edema, AVF L arm - bruitt Skin: venous stasis changes  CMP Latest Ref Rng & Units 01/31/2016 01/31/2016 01/30/2016  Glucose 65 - 99 mg/dL 168(H) 133(H) 256(H)  BUN 6 - 20 mg/dL 44(H) 37(H) 39(H)  Creatinine 0.61 - 1.24 mg/dL 2.34(H) 2.32(H) 2.55(H)  Sodium 135 - 145 mmol/L 136 136 135  Potassium 3.5 - 5.1 mmol/L 4.9 4.9 5.0  Chloride 101 - 111 mmol/L 101 99(L) 100(L)  CO2 22 - 32 mmol/L 26 24 24   Calcium 8.9 - 10.3 mg/dL 9.4 10.0 9.6  Total Protein 6.5 - 8.1 g/dL - - -  Total Bilirubin 0.3 - 1.2 mg/dL - - -  Alkaline Phos 38 - 126 U/L - - -  AST 15 - 41 U/L - - -  ALT 17 - 63 U/L - - -    CBC Latest Ref Rng & Units 02/01/2016 01/31/2016 01/30/2016  WBC 4.0 - 10.5 K/uL 13.0(H) 15.0(H) 12.0(H)  Hemoglobin 13.0 - 17.0 g/dL  7.7(L) 8.3(L) 8.0(L)  Hematocrit 39.0 - 52.0 % 25.0(L) 27.6(L) 26.1(L)  Platelets 150 - 400 K/uL 203 180 167    ABG    Component Value Date/Time   PHART 7.485 (H) 02/01/2016 0248   PCO2ART 32.9 02/01/2016 0248   PO2ART 43.0 (L) 02/01/2016 0248   HCO3 25.4 02/01/2016 0248   TCO2 26 02/01/2016 0248   ACIDBASEDEF 0.5 01/25/2016 0345   O2SAT 88.0 02/01/2016 0248    CBG (last 3)   Recent Labs  01/31/16 2347 02/01/16 0334 02/01/16 0827  GLUCAP 199* 158* 158*     Imaging: Dg Chest Port 1 View  Result Date: 02/01/2016 CLINICAL DATA:  Endotracheal tube. Kidney disease. Hyperlipidemia. Diabetes. EXAM: PORTABLE CHEST 1 VIEW COMPARISON:  Earlier today at 310 hours. FINDINGS: 0523 hours. Endotracheal tube terminates 4.5 cm above carina.Nasogastric tube extends beyond the inferior aspect of the film. Mildly degraded exam due to AP portable technique and patient body habitus. Midline trachea. Cardiomegaly accentuated by AP portable technique. Left lower chest excluded laterally. Probable small bilateral pleural effusions. No pneumothorax. Right greater than left basilar predominant airspace disease is slightly increased. There may be a left internal jugular line, which is suboptimally visualized centrally. Alternatively, this could be external  to the patient. IMPRESSION: Overall worsened aeration with increased right greater than left airspace disease and probable pleural fluid. Electronically Signed   By: Abigail Miyamoto M.D.   On: 02/01/2016 07:26   Dg Chest Port 1 View  Result Date: 02/01/2016 CLINICAL DATA:  Endotracheal tube position.  Desaturation. EXAM: PORTABLE CHEST 1 VIEW COMPARISON:  Yesterday at 0551 hour FINDINGS: Endotracheal tube is 6.5 cm from the carina at the thoracic inlet. Enteric tube remains in place. Left internal jugular catheter tip is not well visualized. Patient remains rotated. Low lung volumes with hazy bibasilar opacities, right greater than left. No pneumothorax.  IMPRESSION: 1. Endotracheal tube at the thoracic inlet 6.5 cm from the carina. 2. Bibasilar opacities, right greater than left, likely combination of effusions and atelectasis, pneumonia not excluded. Mild worsening at the right lung base since prior exam. Electronically Signed   By: Jeb Levering M.D.   On: 02/01/2016 03:45   Dg Chest Port 1 View  Result Date: 01/31/2016 CLINICAL DATA:  Acute respiratory failure, diabetes mellitus, hypertension, chronic kidney disease EXAM: PORTABLE CHEST 1 VIEW COMPARISON:  Portable exam 0551 hours compared to 01/30/2016 FINDINGS: Tip of endotracheal tube projects 6.6 cm above carina. Nasogastric tube extends below extent of chest radiograph. LEFT jugular central venous catheter with tip projecting over LEFT brachiocephalic vein. Rotated to the RIGHT. Bibasilar lung opacification question atelectasis versus consolidation. Generally low lung volumes. No gross pneumothorax or pleural effusion. IMPRESSION: Low lung volumes with bibasilar atelectasis versus consolidation. Electronically Signed   By: Lavonia Dana M.D.   On: 01/31/2016 08:19    Studies: CT head 12/17 >>neg acute  Echo >> 01/29/2016>> EF 123456, Grade 1 diastolic dysfunction  Cultures: Blood 12/17 >>ngtd Sputum 12/17 >>normal flora  resp 12/22 >>  Antitibiotics: 12/22 zosyn >> 12/22 vanc >>  Lines/tubes:  ETT 12/17 >> HD cath R Femoral >> 12/19 Left IJ HD 12/19>>>    ASSESSMENT / PLAN:  Shock of unclear etiology, ? Relative adrenal insuff, now on CRRT  PULMONARY Acute hypoxic/hypercapnic respiratory failure in setting of pulmonary edema. ARDS - increased PEEP/FIO2 12/25 P:   Full vent support Titrate FiO2 / keep peep @ 10  CARDIOVASCULAR Pulmonary edema  Hx of HTN, HLD. Hypothermia resolving EF>> 60-65% ( 12/18), no pericardial efusion Hypotension ?adrenal insuff vs sedation/ vent related P:  Continue low dose levophed as needed to maintain MAP and allow volume removal   hold home norvasc, lasix, crestor, toprol - Continue stress steroids   RENAL ESRD , now on CRRT, has AVF Goal hourly net neg 100 cc / hr P:   F/u chem  Strict I&O Renal following ,CVVHD ongoing,keep even since on levophed    GASTROINTESTINAL Constipation P:   PPI  NPO TF at goal   HEMATOLOGIC Anemia of chronic disease P:  F/u CBC SCD's  Transfuse for HGB >7 Trend coags as needed  INFECTIOUS HCAP P:   ct empiric zosyn vanc until resp cx back Pct low reassuring  ENDOCRINE DM   Cortisol=9.8  P:   SSI   NEUROLOGIC Acute metabolic encephalopathy - CT head neg  Agitation  P:   RASS goal 0 to -1 Continue to hold outpt neurontin  Daily WUA  Titrate clonazepam 1 bid  and increase seroquel 200 BID and attempt Minimise fentanyl gtt & versed gtt   Updated daughter 12/24  The patient is critically ill with multiple organ systems failure and requires high complexity decision making for assessment and support, frequent evaluation and  titration of therapies, application of advanced monitoring technologies and extensive interpretation of multiple databases. Critical Care Time devoted to patient care services described in this note independent of APP time is 34 minutes.   Kara Mead MD. Shade Flood. [facility] Pager [phone] If no response call [phone]     02/01/2016  9:58 AM

## 2016-02-02 ENCOUNTER — Inpatient Hospital Stay (HOSPITAL_COMMUNITY): Payer: Medicare Other

## 2016-02-02 DIAGNOSIS — I4891 Unspecified atrial fibrillation: Secondary | ICD-10-CM

## 2016-02-02 LAB — BLOOD GAS, ARTERIAL
ACID-BASE EXCESS: 0.3 mmol/L (ref 0.0–2.0)
BICARBONATE: 24.2 mmol/L (ref 20.0–28.0)
DRAWN BY: 24487
FIO2: 70
LHR: 20 {breaths}/min
MECHVT: 560 mL
O2 Saturation: 96.4 %
PEEP/CPAP: 8 cmH2O
Patient temperature: 97.7
pCO2 arterial: 36.9 mmHg (ref 32.0–48.0)
pH, Arterial: 7.43 (ref 7.350–7.450)
pO2, Arterial: 84.6 mmHg (ref 83.0–108.0)

## 2016-02-02 LAB — CBC
HCT: 24.5 % — ABNORMAL LOW (ref 39.0–52.0)
Hemoglobin: 7.6 g/dL — ABNORMAL LOW (ref 13.0–17.0)
MCH: 28.1 pg (ref 26.0–34.0)
MCHC: 31 g/dL (ref 30.0–36.0)
MCV: 90.7 fL (ref 78.0–100.0)
PLATELETS: 245 10*3/uL (ref 150–400)
RBC: 2.7 MIL/uL — ABNORMAL LOW (ref 4.22–5.81)
RDW: 18.7 % — AB (ref 11.5–15.5)
WBC: 14.2 10*3/uL — AB (ref 4.0–10.5)

## 2016-02-02 LAB — GLUCOSE, CAPILLARY
GLUCOSE-CAPILLARY: 185 mg/dL — AB (ref 65–99)
GLUCOSE-CAPILLARY: 186 mg/dL — AB (ref 65–99)
Glucose-Capillary: 159 mg/dL — ABNORMAL HIGH (ref 65–99)
Glucose-Capillary: 169 mg/dL — ABNORMAL HIGH (ref 65–99)
Glucose-Capillary: 182 mg/dL — ABNORMAL HIGH (ref 65–99)
Glucose-Capillary: 182 mg/dL — ABNORMAL HIGH (ref 65–99)

## 2016-02-02 LAB — RENAL FUNCTION PANEL
ALBUMIN: 2.3 g/dL — AB (ref 3.5–5.0)
ANION GAP: 12 (ref 5–15)
Albumin: 2.3 g/dL — ABNORMAL LOW (ref 3.5–5.0)
Anion gap: 9 (ref 5–15)
BUN: 48 mg/dL — ABNORMAL HIGH (ref 6–20)
BUN: 44 mg/dL — AB (ref 6–20)
CALCIUM: 10 mg/dL (ref 8.9–10.3)
CALCIUM: 9.1 mg/dL (ref 8.9–10.3)
CO2: 25 mmol/L (ref 22–32)
CO2: 26 mmol/L (ref 22–32)
CREATININE: 2.14 mg/dL — AB (ref 0.61–1.24)
Chloride: 100 mmol/L — ABNORMAL LOW (ref 101–111)
Chloride: 102 mmol/L (ref 101–111)
Creatinine, Ser: 2.65 mg/dL — ABNORMAL HIGH (ref 0.61–1.24)
GFR calc Af Amer: 35 mL/min — ABNORMAL LOW (ref >60)
GFR, EST AFRICAN AMERICAN: 27 mL/min — AB (ref >60)
GFR, EST NON AFRICAN AMERICAN: 23 mL/min — AB (ref >60)
GFR, EST NON AFRICAN AMERICAN: 30 mL/min — AB (ref >60)
Glucose, Bld: 127 mg/dL — ABNORMAL HIGH (ref 65–99)
Glucose, Bld: 203 mg/dL — ABNORMAL HIGH (ref 65–99)
PHOSPHORUS: 4.7 mg/dL — AB (ref 2.5–4.6)
POTASSIUM: 5 mmol/L (ref 3.5–5.1)
Phosphorus: 4.2 mg/dL (ref 2.5–4.6)
Potassium: 5 mmol/L (ref 3.5–5.1)
SODIUM: 137 mmol/L (ref 135–145)
SODIUM: 137 mmol/L (ref 135–145)

## 2016-02-02 LAB — APTT: APTT: 30 s (ref 24–36)

## 2016-02-02 LAB — MAGNESIUM: Magnesium: 2.8 mg/dL — ABNORMAL HIGH (ref 1.7–2.4)

## 2016-02-02 MED ORDER — AMIODARONE LOAD VIA INFUSION
150.0000 mg | Freq: Once | INTRAVENOUS | Status: AC
  Administered 2016-02-02: 150 mg via INTRAVENOUS
  Filled 2016-02-02: qty 83.34

## 2016-02-02 MED ORDER — AMIODARONE HCL IN DEXTROSE 360-4.14 MG/200ML-% IV SOLN
30.0000 mg/h | INTRAVENOUS | Status: DC
  Administered 2016-02-02 – 2016-02-05 (×6): 30 mg/h via INTRAVENOUS
  Filled 2016-02-02 (×15): qty 200

## 2016-02-02 MED ORDER — VECURONIUM BROMIDE 10 MG IV SOLR
INTRAVENOUS | Status: AC
  Administered 2016-02-02: 10 mg
  Filled 2016-02-02: qty 10

## 2016-02-02 MED ORDER — AMIODARONE HCL IN DEXTROSE 360-4.14 MG/200ML-% IV SOLN
60.0000 mg/h | INTRAVENOUS | Status: AC
  Administered 2016-02-02 (×2): 60 mg/h via INTRAVENOUS
  Filled 2016-02-02: qty 200

## 2016-02-02 NOTE — Progress Notes (Signed)
CVVHD Management.  On 1 pressor.  BP marginal.  Metabolically stable though K sl higher at 5.  If K higher with afternoon labs then Dc K in replacement fluid.

## 2016-02-02 NOTE — Progress Notes (Signed)
PCCM Admission Note  Admission date: 01/10/2016 Referring provider: Dr. Sherry Ruffing, ER  CC: feels weak  HPI: 65 yo male brought to ER 12/17 with generalized weakness.  Intubated for hypoxia/ hypercarbia & hypothermia 89 .He has CKD 5- biopsy proven FSGS  and CXR showed pulmonary edema.    Events:  12/17 admit, renal consulted 12/18 CRRT 12/19 Severe agitation   Pulled HD cath out.  12/20 seroquel + klonopin added 12/25 worse hypoxia + BL ASD  -needing PEEP/ increase FIO2  Subjective/Interval Hx:  Remains critically ill  , levophed gtt Down to 70%/PEEP 8 on CVVHD Sedated on versed/ fent gtt   Vital signs: BP (!) 87/72   Pulse 99   Temp 97 F (36.1 C) (Oral)   Resp (!) 24   Ht 6\' 1"  (1.854 m)   Wt (!) 313 lb 7.9 oz (142.2 kg)   SpO2 (!) 88%   BMI 41.36 kg/m   Intake/output: I/O last 3 completed shifts: In: 4423.7 [I.V.:1053.7; NG/GT:2470; IV [id number] Out: [id number] [Other:4274]  General: sedated, NAD on vent, CVVHD  Neuro: RASS -3, on fent gtt HEENT: ETT , no pallor, icterus Cardiac: regular, no murmur Chest: resps even non labored on vent, coarse, few scattered crackles BL Abd: obese, soft, non tender, decreased bowel sounds Ext: 1+ edema, AVF L arm - bruitt Skin: venous stasis changes  CMP Latest Ref Rng & Units 02/02/2016 02/01/2016 01/31/2016  Glucose 65 - 99 mg/dL 127(H) 193(H) 168(H)  BUN 6 - 20 mg/dL 48(H) 46(H) 44(H)  Creatinine 0.61 - 1.24 mg/dL 2.65(H) 2.38(H) 2.34(H)  Sodium 135 - 145 mmol/L 137 137 136  Potassium 3.5 - 5.1 mmol/L 5.0 4.6 4.9  Chloride 101 - 111 mmol/L 100(L) 100(L) 101  CO2 22 - 32 mmol/L 25 25 26   Calcium 8.9 - 10.3 mg/dL 10.0 9.7 9.4  Total Protein 6.5 - 8.1 g/dL - - -  Total Bilirubin 0.3 - 1.2 mg/dL - - -  Alkaline Phos 38 - 126 U/L - - -  AST 15 - 41 U/L - - -  ALT 17 - 63 U/L - - -    CBC Latest Ref Rng & Units 02/02/2016 02/01/2016 01/31/2016  WBC 4.0 - 10.5 K/uL 14.2(H) 13.0(H) 15.0(H)  Hemoglobin 13.0 - 17.0 g/dL  7.6(L) 7.7(L) 8.3(L)  Hematocrit 39.0 - 52.0 % 24.5(L) 25.0(L) 27.6(L)  Platelets 150 - 400 K/uL 245 203 180    ABG    Component Value Date/Time   PHART 7.430 02/02/2016 0315   PCO2ART 36.9 02/02/2016 0315   PO2ART 84.6 02/02/2016 0315   HCO3 24.2 02/02/2016 0315   TCO2 30 02/01/2016 1205   ACIDBASEDEF 0.5 01/25/2016 0345   O2SAT 96.4 02/02/2016 0315    CBG (last 3)   Recent Labs  02/01/16 2029 02/02/16 0027 02/02/16 0324  GLUCAP 146* 182* 169*     Imaging: Dg Chest Port 1 View  Result Date: 02/02/2016 CLINICAL DATA:  Respiratory failure. EXAM: PORTABLE CHEST 1 VIEW COMPARISON:  02/01/2016. FINDINGS: Endotracheal tube and NG tube in stable position. Stable cardiomegaly. Persistent basilar atelectasis. Persistent but slightly improving bibasilar infiltrates. Small bilateral pleural effusions cannot be excluded. No pneumothorax. IMPRESSION: 1. Lines and tubes in stable position. 2. Persistent low lung volumes. Persistent basilar infiltrates with slight improvement from prior exam. Small bilateral persistent pleural effusions. 3. Stable cardiomegaly. Electronically Signed   By: Marcello Moores  Register   On: 02/02/2016 07:15   Dg Chest Port 1 View  Result Date: 02/01/2016 CLINICAL DATA:  Endotracheal tube.  Kidney disease. Hyperlipidemia. Diabetes. EXAM: PORTABLE CHEST 1 VIEW COMPARISON:  Earlier today at 310 hours. FINDINGS: 0523 hours. Endotracheal tube terminates 4.5 cm above carina.Nasogastric tube extends beyond the inferior aspect of the film. Mildly degraded exam due to AP portable technique and patient body habitus. Midline trachea. Cardiomegaly accentuated by AP portable technique. Left lower chest excluded laterally. Probable small bilateral pleural effusions. No pneumothorax. Right greater than left basilar predominant airspace disease is slightly increased. There may be a left internal jugular line, which is suboptimally visualized centrally. Alternatively, this could be external  to the patient. IMPRESSION: Overall worsened aeration with increased right greater than left airspace disease and probable pleural fluid. Electronically Signed   By: Abigail Miyamoto M.D.   On: 02/01/2016 07:26   Dg Chest Port 1 View  Result Date: 02/01/2016 CLINICAL DATA:  Endotracheal tube position.  Desaturation. EXAM: PORTABLE CHEST 1 VIEW COMPARISON:  Yesterday at 0551 hour FINDINGS: Endotracheal tube is 6.5 cm from the carina at the thoracic inlet. Enteric tube remains in place. Left internal jugular catheter tip is not well visualized. Patient remains rotated. Low lung volumes with hazy bibasilar opacities, right greater than left. No pneumothorax. IMPRESSION: 1. Endotracheal tube at the thoracic inlet 6.5 cm from the carina. 2. Bibasilar opacities, right greater than left, likely combination of effusions and atelectasis, pneumonia not excluded. Mild worsening at the right lung base since prior exam. Electronically Signed   By: Jeb Levering M.D.   On: 02/01/2016 03:45    Studies: CT head 12/17 >>neg acute  Echo >> 02/03/2016>> EF 123456, Grade 1 diastolic dysfunction  Cultures: Blood 12/17 >>ngtd Sputum 12/17 >>normal flora  resp 12/22 >>  Antitibiotics: 12/22 zosyn >> 12/22 vanc >>  Lines/tubes:  ETT 12/17 >> HD cath R Femoral >> 12/19 Left IJ HD 12/19>>>    ASSESSMENT / PLAN:  Shock of unclear etiology, ? Relative adrenal insuff, now on CRRT  PULMONARY Acute hypoxic/hypercapnic respiratory failure in setting of pulmonary edema. ARDS - increased PEEP/FIO2 12/25 P:   Full vent support Titrate FiO2 / keep peep @ 10-12 No SBTs  CARDIOVASCULAR Pulmonary edema  Hx of HTN, HLD. Hypothermia resolving EF>> 60-65% ( 12/18), no pericardial efusion Hypotension ?adrenal insuff vs sedation/ vent related 12/26 AF-RVR P:  Continue low dose levophed as needed to maintain MAP and allow volume removal  hold home norvasc, lasix, crestor, toprol - Continue stress steroids  Amio  bolus & gtt  RENAL ESRD , now on CRRT, has AVF Goal hourly net neg 100 cc / hr P:   F/u chem  Strict I&O Renal following ,CVVHD ongoing,keep even since on levophed    GASTROINTESTINAL Constipation P:   PPI  NPO TF at goal   HEMATOLOGIC Anemia of chronic disease P:  F/u CBC SCD's  Transfuse for HGB >7 Trend coags as needed  INFECTIOUS HCAP P:   ct empiric zosyn vanc until resp cx back Pct low reassuring  ENDOCRINE DM   Cortisol=9.8  P:   SSI  Stress dose steroids  NEUROLOGIC Acute metabolic encephalopathy - CT head neg  Agitation  P:   RASS goal 0 to -1 hold outpt neurontin  Daily WUA  Titrated clonazepam 1 bid  and increase seroquel 200 BID and attempt Minimise fentanyl gtt & versed gtt   Updated daughter 12/24  The patient is critically ill with multiple organ systems failure and requires high complexity decision making for assessment and support, frequent evaluation and titration of therapies, application of advanced  monitoring technologies and extensive interpretation of multiple databases. Critical Care Time devoted to patient care services described in this note independent of APP time is 32 minutes.   Kara Mead MD. Shade Flood. [facility] Pager [phone] If no response call [phone]     02/02/2016  8:16 AM

## 2016-02-02 NOTE — Procedures (Signed)
Central Venous Catheter Insertion Procedure Note Adriane Brincks Dispenza [id number] 31-Dec-1950  Procedure: Insertion of Central Venous Catheter Indications: Assessment of intravascular volume, Drug and/or fluid administration and Frequent blood sampling  Procedure Details Consent: Risks of procedure as well as the alternatives and risks of each were explained to the (patient/caregiver).  Consent for procedure obtained. Time Out: Verified patient identification, verified procedure, site/side was marked, verified correct patient position, special equipment/implants available, medications/allergies/relevent history reviewed, required imaging and test results available.  Performed  Maximum sterile technique was used including antiseptics, cap, gloves, gown, hand hygiene, mask and sheet. Skin prep: Chlorhexidine; local anesthetic administered A antimicrobial bonded/coated triple lumen catheter was placed in the right internal jugular vein using the Seldinger technique. Ultrasound guidance used.Yes.   Catheter placed to 17 cm. Blood aspirated via all 3 ports and then flushed x 3. Line sutured x 2 and dressing applied.  Evaluation Blood flow good Complications: No apparent complications Patient did tolerate procedure well. Chest X-ray ordered to verify placement.  CXR: pending.  Richardson Landry Minor ACNP [facility] PCCM Pager [phone] till 3 pm If no answer page [phone] 02/02/2016, 11:29 AM

## 2016-02-03 ENCOUNTER — Inpatient Hospital Stay (HOSPITAL_COMMUNITY): Payer: Medicare Other

## 2016-02-03 LAB — RENAL FUNCTION PANEL
ALBUMIN: 2.2 g/dL — AB (ref 3.5–5.0)
ALBUMIN: 2.4 g/dL — AB (ref 3.5–5.0)
ANION GAP: 12 (ref 5–15)
Anion gap: 12 (ref 5–15)
BUN: 47 mg/dL — AB (ref 6–20)
BUN: 50 mg/dL — AB (ref 6–20)
CO2: 26 mmol/L (ref 22–32)
CO2: 25 mmol/L (ref 22–32)
CREATININE: 2.43 mg/dL — AB (ref 0.61–1.24)
Calcium: 9.8 mg/dL (ref 8.9–10.3)
Calcium: 9.4 mg/dL (ref 8.9–10.3)
Chloride: 99 mmol/L — ABNORMAL LOW (ref 101–111)
Chloride: 98 mmol/L — ABNORMAL LOW (ref 101–111)
Creatinine, Ser: 2.21 mg/dL — ABNORMAL HIGH (ref 0.61–1.24)
GFR calc Af Amer: 34 mL/min — ABNORMAL LOW (ref >60)
GFR calc Af Amer: 30 mL/min — ABNORMAL LOW (ref >60)
GFR calc non Af Amer: 29 mL/min — ABNORMAL LOW (ref >60)
GFR, EST NON AFRICAN AMERICAN: 26 mL/min — AB (ref >60)
GLUCOSE: 126 mg/dL — AB (ref 65–99)
GLUCOSE: 221 mg/dL — AB (ref 65–99)
PHOSPHORUS: 4.4 mg/dL (ref 2.5–4.6)
PHOSPHORUS: 5.4 mg/dL — AB (ref 2.5–4.6)
POTASSIUM: 5.1 mmol/L (ref 3.5–5.1)
POTASSIUM: 5 mmol/L (ref 3.5–5.1)
SODIUM: 137 mmol/L (ref 135–145)
SODIUM: 135 mmol/L (ref 135–145)

## 2016-02-03 LAB — CBC
HCT: 25.8 % — ABNORMAL LOW (ref 39.0–52.0)
HEMOGLOBIN: 7.8 g/dL — AB (ref 13.0–17.0)
MCH: 28.2 pg (ref 26.0–34.0)
MCHC: 30.2 g/dL (ref 30.0–36.0)
MCV: 93.1 fL (ref 78.0–100.0)
Platelets: 247 10*3/uL (ref 150–400)
RBC: 2.77 MIL/uL — AB (ref 4.22–5.81)
RDW: 18.5 % — ABNORMAL HIGH (ref 11.5–15.5)
WBC: 13.5 10*3/uL — ABNORMAL HIGH (ref 4.0–10.5)

## 2016-02-03 LAB — MAGNESIUM: Magnesium: 2.9 mg/dL — ABNORMAL HIGH (ref 1.7–2.4)

## 2016-02-03 LAB — GLUCOSE, CAPILLARY
GLUCOSE-CAPILLARY: 138 mg/dL — AB (ref 65–99)
GLUCOSE-CAPILLARY: 180 mg/dL — AB (ref 65–99)
GLUCOSE-CAPILLARY: 203 mg/dL — AB (ref 65–99)
GLUCOSE-CAPILLARY: 218 mg/dL — AB (ref 65–99)
Glucose-Capillary: 163 mg/dL — ABNORMAL HIGH (ref 65–99)
Glucose-Capillary: 135 mg/dL — ABNORMAL HIGH (ref 65–99)
Glucose-Capillary: 230 mg/dL — ABNORMAL HIGH (ref 65–99)

## 2016-02-03 LAB — APTT: aPTT: 31 seconds (ref 24–36)

## 2016-02-03 LAB — BLOOD GAS, ARTERIAL
Acid-Base Excess: 0.8 mmol/L (ref 0.0–2.0)
Bicarbonate: 25.1 mmol/L (ref 20.0–28.0)
DRAWN BY: 24487
FIO2: 80
O2 SAT: 86.8 %
PATIENT TEMPERATURE: 97.5
PCO2 ART: 40.4 mmHg (ref 32.0–48.0)
PEEP: 8 cmH2O
PH ART: 7.407 (ref 7.350–7.450)
PO2 ART: 55.8 mmHg — AB (ref 83.0–108.0)
RATE: 20 resp/min
VT: 560 mL

## 2016-02-03 MED ORDER — VASOPRESSIN 20 UNIT/ML IV SOLN
0.0300 [IU]/min | INTRAVENOUS | Status: DC
  Administered 2016-02-03 – 2016-02-09 (×6): 0.03 [IU]/min via INTRAVENOUS
  Filled 2016-02-03 (×7): qty 2

## 2016-02-03 MED ORDER — PRISMASOL BGK 0/2.5 32-2.5 MEQ/L IV SOLN
INTRAVENOUS | Status: AC
  Administered 2016-02-03 – 2016-02-04 (×2): via INTRAVENOUS_CENTRAL
  Filled 2016-02-03 (×5): qty 5000

## 2016-02-03 MED ORDER — PRISMASOL BGK 0/2.5 32-2.5 MEQ/L IV SOLN
INTRAVENOUS | Status: AC
  Administered 2016-02-03 – 2016-02-05 (×4): via INTRAVENOUS_CENTRAL
  Filled 2016-02-03 (×6): qty 5000

## 2016-02-03 MED ORDER — VECURONIUM BROMIDE 10 MG IV SOLR
INTRAVENOUS | Status: AC
  Filled 2016-02-03: qty 10

## 2016-02-03 MED ORDER — VECURONIUM BROMIDE 10 MG IV SOLR
10.0000 mg | Freq: Once | INTRAVENOUS | Status: AC
  Administered 2016-02-03: 10 mg via INTRAVENOUS

## 2016-02-03 NOTE — Progress Notes (Signed)
CVVHD Management.  Still on 1 pressor .  K 5.1, will DC K in pre and post filter K.  Cont to keep even as volume status appears OK and CVP 9.

## 2016-02-03 NOTE — Procedures (Signed)
Arterial Catheter Insertion Procedure Note Yekusiel Vanotterloo Dust [id number] 1950/08/07  Procedure: Insertion of Arterial Catheter  Indications: Blood pressure monitoring and Frequent blood sampling  Procedure Details Consent: Risks of procedure as well as the alternatives and risks of each were explained to the (patient/caregiver).  Consent for procedure obtained. Time Out: Verified patient identification, verified procedure, site/side was marked, verified correct patient position, special equipment/implants available, medications/allergies/relevent history reviewed, required imaging and test results available.  Performed  Maximum sterile technique was used including antiseptics, cap, gloves, gown, hand hygiene, mask and sheet. Skin prep: Chlorhexidine; local anesthetic administered 20 gauge catheter was inserted into right femoral artery using the Seldinger technique.  Evaluation Blood flow good; BP tracing good. Complications: No apparent complications.   Richardson Landry Minor ACNP [facility] PCCM Pager [phone] till 3 pm If no answer page [phone] 02/03/2016, 11:52 AM

## 2016-02-03 NOTE — Progress Notes (Signed)
Order was placed to place arterial line in patient.  RT attempted maximum amount of times, all unsuccessful.  MD is aware and will place a femoral aline.

## 2016-02-03 NOTE — Progress Notes (Signed)
Pharmacy Antibiotic Note  Miguel [name] is a 65 y.o. male admitted on 01/17/2016 with pneumonia.  Pharmacy has been consulted for Vancomycin and Zosyn dosing. Pt on CRRT  Cx neg shock?  Plan: DC vanc Zosyn 3.375g IV every 6 hours - each dose over 30 minutes.  Monitor culture results and clinical status.  Monitor CRRT plans   Height: 6\' 1"  (185.4 cm) Weight: (!) 308 lb (139.7 kg) IBW/kg (Calculated) : 79.9  Temp (24hrs), Avg:96.4 F (35.8 C), Min:94.1 F (34.5 C), Max:97.8 F (36.6 C)   Recent Labs Lab 01/30/16 0503  01/31/16 0500 01/31/16 1600 02/01/16 0432 02/01/16 1046 02/02/16 0443 02/02/16 1515 02/03/16 0457  WBC 12.0*  --  15.0*  --  13.0*  --  14.2*  --  13.5*  CREATININE 2.97*  < > 2.32* 2.34*  --  2.38* 2.65* 2.14* 2.21*  < > = values in this interval not displayed.  Estimated Creatinine Clearance: 47.6 mL/min (by C-G formula based on SCr of 2.21 mg/dL (H)).    No Known Allergies  Levester Fresh, PharmD, BCPS, BCCCP Clinical Pharmacist Clinical phone for 02/03/2016 from 7a-3:30p: [phone] If after 3:30p, please call main pharmacy at: [phone] 02/03/2016 10:58 AM

## 2016-02-03 NOTE — Progress Notes (Signed)
PCCM Admission Note  Admission date: 01/15/2016 Referring provider: Dr. Sherry Ruffing, ER  CC: feels weak  HPI: 65 yo male brought to ER 12/17 with generalized weakness.  Intubated for hypoxia/ hypercarbia & hypothermia 89 .He has CKD 5- biopsy proven FSGS  and CXR showed pulmonary edema.    Events:  12/17 admit, renal consulted 12/18 CRRT 12/19 Severe agitation   Pulled HD cath out.  12/20 seroquel + klonopin added 12/25 worse hypoxia + BL ASD  -needing PEEP/ increase FIO2 12/26 increased levo  Subjective/Interval Hx:  Remains critically ill  , on high dose levophed gtt @ 30 Down to 70%/PEEP 8 on CVVHD Sedated on versed/ fent gtt   Vital signs: BP 96/67   Pulse (!) 101   Temp 97.8 F (36.6 C) (Oral)   Resp 20   Ht 6\' 1"  (1.854 m)   Wt (!) 308 lb (139.7 kg)   SpO2 92%   BMI 40.64 kg/m   Intake/output: I/O last 3 completed shifts: In: 5956.5 [I.V.:1936.5; NG/GT:3270; IV Piggyback:750] Out: [id number] [Urine:30; Other:5960; Stool:125]  General: sedated, NAD on vent, CVVHD  Neuro: RASS -3, on fent gtt HEENT: ETT , no pallor, icterus Cardiac: regular, no murmur Chest: resps even non labored on vent, coarse, few scattered crackles BL Abd: obese, soft, non tender, decreased bowel sounds Ext: 1+ edema, AVF L arm - bruitt Skin: venous stasis changes  CMP Latest Ref Rng & Units 02/03/2016 02/02/2016 02/02/2016  Glucose 65 - 99 mg/dL 126(H) 203(H) 127(H)  BUN 6 - 20 mg/dL 47(H) 44(H) 48(H)  Creatinine 0.61 - 1.24 mg/dL 2.21(H) 2.14(H) 2.65(H)  Sodium 135 - 145 mmol/L 137 137 137  Potassium 3.5 - 5.1 mmol/L 5.1 5.0 5.0  Chloride 101 - 111 mmol/L 99(L) 102 100(L)  CO2 22 - 32 mmol/L 26 26 25   Calcium 8.9 - 10.3 mg/dL 9.8 9.1 10.0  Total Protein 6.5 - 8.1 g/dL - - -  Total Bilirubin 0.3 - 1.2 mg/dL - - -  Alkaline Phos 38 - 126 U/L - - -  AST 15 - 41 U/L - - -  ALT 17 - 63 U/L - - -    CBC Latest Ref Rng & Units 02/03/2016 02/02/2016 02/01/2016  WBC 4.0 - 10.5 K/uL 13.5(H)  14.2(H) 13.0(H)  Hemoglobin 13.0 - 17.0 g/dL 7.8(L) 7.6(L) 7.7(L)  Hematocrit 39.0 - 52.0 % 25.8(L) 24.5(L) 25.0(L)  Platelets 150 - 400 K/uL 247 245 203    ABG    Component Value Date/Time   PHART 7.407 02/03/2016 0355   PCO2ART 40.4 02/03/2016 0355   PO2ART 55.8 (L) 02/03/2016 0355   HCO3 25.1 02/03/2016 0355   TCO2 30 02/01/2016 1205   ACIDBASEDEF 0.5 01/25/2016 0345   O2SAT 86.8 02/03/2016 0355    CBG (last 3)   Recent Labs  02/03/16 0032 02/03/16 0346 02/03/16 0810  GLUCAP 180* 138* 163*     Imaging: Dg Chest Port 1 View  Result Date: 02/03/2016 CLINICAL DATA:  Respiratory failure.  Shortness of breath. EXAM: PORTABLE CHEST 1 VIEW COMPARISON:  Twelve 11/13/2015 . FINDINGS: Endotracheal tube, NG tube, right IJ line in stable position. Cardiomegaly. Low lung volumes with basilar atelectasis. Bibasilar infiltrates noted on today's exam. No pleural effusion or pneumothorax. IMPRESSION: 1. Lines and tubes in stable position. 2. Low lung volumes with basilar atelectasis. Progressive bibasilar pulmonary infiltrates are noted. Electronically Signed   By: Marcello Moores  Register   On: 02/03/2016 06:56   Dg Chest Port 1 View  Result Date: 02/02/2016 CLINICAL  DATA:  Right central line placement EXAM: PORTABLE CHEST 1 VIEW COMPARISON:  02/02/2016 FINDINGS: Endotracheal tube with the tip 5 cm above the carina. Right jugular central venous catheter with the tip projecting over the SVC. Left jugular central venous catheter in unchanged position. Nasogastric coursing below the diaphragm. Low lung volumes. Bilateral interstitial thickening. No pneumothorax. Small left pleural effusion. Stable cardiomediastinal silhouette. IMPRESSION: Interval placement of a right jugular central venous catheter with the tip projecting over the SVC. No pneumothorax. Electronically Signed   By: Kathreen Devoid   On: 02/02/2016 12:03   Dg Chest Port 1 View  Result Date: 02/02/2016 CLINICAL DATA:  Respiratory  failure. EXAM: PORTABLE CHEST 1 VIEW COMPARISON:  02/01/2016. FINDINGS: Endotracheal tube and NG tube in stable position. Stable cardiomegaly. Persistent basilar atelectasis. Persistent but slightly improving bibasilar infiltrates. Small bilateral pleural effusions cannot be excluded. No pneumothorax. IMPRESSION: 1. Lines and tubes in stable position. 2. Persistent low lung volumes. Persistent basilar infiltrates with slight improvement from prior exam. Small bilateral persistent pleural effusions. 3. Stable cardiomegaly. Electronically Signed   By: Marcello Moores  Register   On: 02/02/2016 07:15    Studies: CT head 12/17 >>neg acute  Echo >> 02/01/2016>> EF 123456, Grade 1 diastolic dysfunction  Cultures: Blood 12/17 >>ngtd Sputum 12/17 >>normal flora  resp 12/22 >>  Antitibiotics: 12/22 zosyn >> 12/22 vanc >>  Lines/tubes:  ETT 12/17 >> HD cath R Femoral >> 12/19 Left IJ HD 12/19>>> RIJ 12/26 >>    ASSESSMENT / PLAN:  Shock of unclear etiology, ? Relative adrenal insuff, now on CRRT  PULMONARY Acute hypoxic/hypercapnic respiratory failure in setting of pulmonary edema. ARDS - increased PEEP/FIO2 12/25 P:   Full vent support Titrate FiO2 / keep peep @ 10-12 No SBTs  CARDIOVASCULAR Pulmonary edema  Hx of HTN, HLD. Hypothermia resolving EF>> 60-65% ( 12/18), no pericardial efusion Hypotension ?adrenal insuff vs sedation/ vent related 12/26 AF-RVR P:  Continue  levophed as needed to maintain MAP  Add vaso & place a -line hold home norvasc, lasix, crestor, toprol - Continue stress steroids  Amio bolus & gtt started 12/26  RENAL ESRD , now on CRRT, has AVF Goal hourly net neg 100 cc / hr P:   F/u chem  Strict I&O Renal following ,CVVHD ongoing,keep even since on levophed    GASTROINTESTINAL Constipation P:   PPI  NPO TF at goal   HEMATOLOGIC Anemia of chronic disease P:  F/u CBC SCD's  Transfuse for HGB >7 Trend coags as needed  INFECTIOUS HCAP P:   ct  empiric zosyn  until resp cx back Dc vanc Pct low reassuring  ENDOCRINE DM   Cortisol=9.8  P:   SSI  Stress dose steroids  NEUROLOGIC Acute metabolic encephalopathy - CT head neg  Agitation  P:   RASS goal 0 to -1 hold outpt neurontin  Daily WUA  Titrated clonazepam 1 bid  and increase seroquel 200 BID and attempt Minimise fentanyl gtt & versed gtt   Updated daughter   The patient is critically ill with multiple organ systems failure and requires high complexity decision making for assessment and support, frequent evaluation and titration of therapies, application of advanced monitoring technologies and extensive interpretation of multiple databases. Critical Care Time devoted to patient care services described in this note independent of APP time is 35 minutes.   Kara Mead MD. Shade Flood. [facility] Pager [phone] If no response call [phone]     02/03/2016  10:56  AM    

## 2016-02-04 ENCOUNTER — Inpatient Hospital Stay (HOSPITAL_COMMUNITY): Payer: Medicare Other

## 2016-02-04 DIAGNOSIS — J9601 Acute respiratory failure with hypoxia: Secondary | ICD-10-CM

## 2016-02-04 DIAGNOSIS — R6521 Severe sepsis with septic shock: Secondary | ICD-10-CM

## 2016-02-04 DIAGNOSIS — R579 Shock, unspecified: Secondary | ICD-10-CM

## 2016-02-04 DIAGNOSIS — A419 Sepsis, unspecified organism: Secondary | ICD-10-CM

## 2016-02-04 DIAGNOSIS — J80 Acute respiratory distress syndrome: Secondary | ICD-10-CM

## 2016-02-04 DIAGNOSIS — N179 Acute kidney failure, unspecified: Secondary | ICD-10-CM

## 2016-02-04 LAB — RENAL FUNCTION PANEL
ALBUMIN: 2.5 g/dL — AB (ref 3.5–5.0)
Albumin: 2.4 g/dL — ABNORMAL LOW (ref 3.5–5.0)
Anion gap: 14 (ref 5–15)
Anion gap: 14 (ref 5–15)
BUN: 51 mg/dL — ABNORMAL HIGH (ref 6–20)
BUN: 45 mg/dL — AB (ref 6–20)
CHLORIDE: 97 mmol/L — AB (ref 101–111)
CHLORIDE: 99 mmol/L — AB (ref 101–111)
CO2: 23 mmol/L (ref 22–32)
CO2: 23 mmol/L (ref 22–32)
CREATININE: 2.28 mg/dL — AB (ref 0.61–1.24)
CREATININE: 2.25 mg/dL — AB (ref 0.61–1.24)
Calcium: 9.5 mg/dL (ref 8.9–10.3)
Calcium: 9.6 mg/dL (ref 8.9–10.3)
GFR calc Af Amer: 33 mL/min — ABNORMAL LOW (ref >60)
GFR calc non Af Amer: 28 mL/min — ABNORMAL LOW (ref >60)
GFR, EST AFRICAN AMERICAN: 32 mL/min — AB (ref >60)
GFR, EST NON AFRICAN AMERICAN: 28 mL/min — AB (ref >60)
Glucose, Bld: 187 mg/dL — ABNORMAL HIGH (ref 65–99)
Glucose, Bld: 197 mg/dL — ABNORMAL HIGH (ref 65–99)
PHOSPHORUS: 4.4 mg/dL (ref 2.5–4.6)
Phosphorus: 5.3 mg/dL — ABNORMAL HIGH (ref 2.5–4.6)
Potassium: 4.6 mmol/L (ref 3.5–5.1)
Potassium: 4.6 mmol/L (ref 3.5–5.1)
SODIUM: 134 mmol/L — AB (ref 135–145)
Sodium: 136 mmol/L (ref 135–145)

## 2016-02-04 LAB — BLOOD GAS, ARTERIAL
ACID-BASE EXCESS: 0.2 mmol/L (ref 0.0–2.0)
BICARBONATE: 25.1 mmol/L (ref 20.0–28.0)
Drawn by: 252031
FIO2: 80
LHR: 20 {breaths}/min
MECHVT: 560 mL
O2 SAT: 90.6 %
PATIENT TEMPERATURE: 98.6
PCO2 ART: 47.1 mmHg (ref 32.0–48.0)
PEEP/CPAP: 10 cmH2O
PH ART: 7.347 — AB (ref 7.350–7.450)
PO2 ART: 66.4 mmHg — AB (ref 83.0–108.0)

## 2016-02-04 LAB — GLUCOSE, CAPILLARY
Glucose-Capillary: 193 mg/dL — ABNORMAL HIGH (ref 65–99)
Glucose-Capillary: 187 mg/dL — ABNORMAL HIGH (ref 65–99)
Glucose-Capillary: 190 mg/dL — ABNORMAL HIGH (ref 65–99)
Glucose-Capillary: 179 mg/dL — ABNORMAL HIGH (ref 65–99)
Glucose-Capillary: 175 mg/dL — ABNORMAL HIGH (ref 65–99)
Glucose-Capillary: 155 mg/dL — ABNORMAL HIGH (ref 65–99)

## 2016-02-04 LAB — POCT ACTIVATED CLOTTING TIME
ACTIVATED CLOTTING TIME: 125 s
ACTIVATED CLOTTING TIME: 142 s
ACTIVATED CLOTTING TIME: 120 s
Activated Clotting Time: 142 seconds

## 2016-02-04 LAB — CBC
HCT: 25.6 % — ABNORMAL LOW (ref 39.0–52.0)
Hemoglobin: 7.8 g/dL — ABNORMAL LOW (ref 13.0–17.0)
MCH: 27.7 pg (ref 26.0–34.0)
MCHC: 30.5 g/dL (ref 30.0–36.0)
MCV: 90.8 fL (ref 78.0–100.0)
PLATELETS: 290 10*3/uL (ref 150–400)
RBC: 2.82 MIL/uL — ABNORMAL LOW (ref 4.22–5.81)
RDW: 18.3 % — AB (ref 11.5–15.5)
WBC: 20.2 10*3/uL — ABNORMAL HIGH (ref 4.0–10.5)

## 2016-02-04 LAB — PROTIME-INR
INR: 1.23
Prothrombin Time: 15.6 seconds — ABNORMAL HIGH (ref 11.4–15.2)

## 2016-02-04 LAB — APTT: APTT: 31 s (ref 24–36)

## 2016-02-04 LAB — MAGNESIUM: MAGNESIUM: 2.9 mg/dL — AB (ref 1.7–2.4)

## 2016-02-04 MED ORDER — VANCOMYCIN HCL 10 G IV SOLR
1250.0000 mg | INTRAVENOUS | Status: DC
  Administered 2016-02-04: 1250 mg via INTRAVENOUS
  Filled 2016-02-04 (×2): qty 1250

## 2016-02-04 MED ORDER — PRISMASOL BGK 0/2.5 32-2.5 MEQ/L IV SOLN
INTRAVENOUS | Status: AC
  Administered 2016-02-04 – 2016-02-05 (×9): via INTRAVENOUS_CENTRAL
  Filled 2016-02-04 (×14): qty 5000

## 2016-02-04 MED ORDER — SODIUM CHLORIDE 0.9 % IJ SOLN
250.0000 [IU]/h | INTRAMUSCULAR | Status: DC
  Administered 2016-02-04: 1000 [IU]/h via INTRAVENOUS_CENTRAL
  Administered 2016-02-04: 250 [IU]/h via INTRAVENOUS_CENTRAL
  Administered 2016-02-04: 1000 [IU]/h via INTRAVENOUS_CENTRAL
  Administered 2016-02-05: 1550 [IU]/h via INTRAVENOUS_CENTRAL
  Administered 2016-02-05: 750 [IU]/h via INTRAVENOUS_CENTRAL
  Administered 2016-02-05: 1500 [IU]/h via INTRAVENOUS_CENTRAL
  Administered 2016-02-07: 1400 [IU]/h via INTRAVENOUS_CENTRAL
  Administered 2016-02-07: 250 [IU]/h via INTRAVENOUS_CENTRAL
  Administered 2016-02-07: 1350 [IU]/h via INTRAVENOUS_CENTRAL
  Administered 2016-02-07: 1150 [IU]/h via INTRAVENOUS_CENTRAL
  Administered 2016-02-07: 1250 [IU]/h via INTRAVENOUS_CENTRAL
  Administered 2016-02-07: 1050 [IU]/h via INTRAVENOUS_CENTRAL
  Administered 2016-02-08 (×2): 1500 [IU]/h via INTRAVENOUS_CENTRAL
  Administered 2016-02-08: 1450 [IU]/h via INTRAVENOUS_CENTRAL
  Administered 2016-02-08 (×2): 1500 [IU]/h via INTRAVENOUS_CENTRAL
  Administered 2016-02-09: 1450 [IU]/h via INTRAVENOUS_CENTRAL
  Administered 2016-02-09: 1350 [IU]/h via INTRAVENOUS_CENTRAL
  Filled 2016-02-04 (×20): qty 2

## 2016-02-04 MED ORDER — SODIUM CHLORIDE 0.9 % IV SOLN
0.3000 ug/kg | Freq: Once | INTRAVENOUS | Status: AC
  Administered 2016-02-04: 39.2 ug via INTRAVENOUS
  Filled 2016-02-04: qty 9.8

## 2016-02-04 MED ORDER — PRISMASOL BGK 0/2.5 32-2.5 MEQ/L IV SOLN
INTRAVENOUS | Status: DC
  Filled 2016-02-04 (×4): qty 5000

## 2016-02-04 MED ORDER — FENTANYL CITRATE (PF) 100 MCG/2ML IJ SOLN
200.0000 ug | Freq: Once | INTRAMUSCULAR | Status: AC
  Administered 2016-02-04: 200 ug via INTRAVENOUS

## 2016-02-04 MED ORDER — ETOMIDATE 2 MG/ML IV SOLN
40.0000 mg | Freq: Once | INTRAVENOUS | Status: AC
  Administered 2016-02-04: 20 mg via INTRAVENOUS
  Filled 2016-02-04: qty 20

## 2016-02-04 MED ORDER — VECURONIUM BROMIDE 10 MG IV SOLR
10.0000 mg | Freq: Once | INTRAVENOUS | Status: AC
  Administered 2016-02-04: 10 mg via INTRAVENOUS
  Filled 2016-02-04: qty 10

## 2016-02-04 MED ORDER — HEPARIN BOLUS VIA INFUSION (CRRT)
1000.0000 [IU] | INTRAVENOUS | Status: DC | PRN
  Administered 2016-02-04 – 2016-02-07 (×7): 1000 [IU] via INTRAVENOUS_CENTRAL
  Filled 2016-02-04: qty 1000

## 2016-02-04 MED ORDER — MIDAZOLAM HCL 2 MG/2ML IJ SOLN
4.0000 mg | Freq: Once | INTRAMUSCULAR | Status: AC
  Administered 2016-02-04: 4 mg via INTRAVENOUS

## 2016-02-04 NOTE — Care Management Important Message (Signed)
Important Message  Patient Details  Name: Miguel [name] MRN: [id number] Date of Birth: [birth year]   Medicare Important Message Given:  Other (see comment)    Ahmeek, Fisher Hargadon Abena 02/04/2016, 10:45 AM

## 2016-02-04 NOTE — Progress Notes (Signed)
PCCM Admission Note  Admission date: 01/27/2016 Referring provider: Dr. Sherry Ruffing, ER  CC: feels weak  HPI: 65 yo male brought to ER 12/17 with generalized weakness.  Intubated for hypoxia/ hypercarbia & hypothermia 89 .He has CKD 5- biopsy proven FSGS  and CXR showed pulmonary edema.    Events:  12/17 admit, renal consulted 12/18 CRRT 12/19 Severe agitation   Pulled HD cath out.  12/20 seroquel + klonopin added 12/25 worse hypoxia + BL ASD  -needing PEEP/ increase FIO2 12/26 increased levo  Subjective/Interval Hx:  Remains critically ill , on high dose levophed gtt 10 and vaso Down to 60%/PEEP 10 on CRRT starting to pull 21ml/Hr Sedated on versed/ fent gtt Developed cuff leak overnight   Vital signs: BP 139/73 (BP Location: Right Arm)   Pulse 88   Temp 99 F (37.2 C) (Oral)   Resp 20   Ht 6\' 1"  (1.854 m)   Wt 130.2 kg (287 lb)   SpO2 96%   BMI 37.87 kg/m   Intake/output: I/O last 3 completed shifts: In: 5806.8 [I.V.:2616.8; Other:30; NG/GT:2960; IV [id number] Out: [id number] [Urine:5; Other:5765]  General: sedated, NAD on vent, CVVHD  Neuro: RASS -2, on versed, fent. Intermittent agitation HEENT: ETT , no pallor, icterus Cardiac: regular, no murmur Chest: resps even non labored on vent, coarse, few scattered crackles BL Abd: obese, soft, non tender, decreased bowel sounds Ext: trace edema, AVF L arm - bruitt Skin: venous stasis changes  CMP Latest Ref Rng & Units 02/04/2016 02/03/2016 02/03/2016  Glucose 65 - 99 mg/dL 187(H) 221(H) 126(H)  BUN 6 - 20 mg/dL 51(H) 50(H) 47(H)  Creatinine 0.61 - 1.24 mg/dL 2.28(H) 2.43(H) 2.21(H)  Sodium 135 - 145 mmol/L 134(L) 135 137  Potassium 3.5 - 5.1 mmol/L 4.6 5.0 5.1  Chloride 101 - 111 mmol/L 97(L) 98(L) 99(L)  CO2 22 - 32 mmol/L 23 25 26   Calcium 8.9 - 10.3 mg/dL 9.5 9.4 9.8  Total Protein 6.5 - 8.1 g/dL - - -  Total Bilirubin 0.3 - 1.2 mg/dL - - -  Alkaline Phos 38 - 126 U/L - - -  AST 15 - 41 U/L - - -  ALT 17 -  63 U/L - - -    CBC Latest Ref Rng & Units 02/04/2016 02/03/2016 02/02/2016  WBC 4.0 - 10.5 K/uL 20.2(H) 13.5(H) 14.2(H)  Hemoglobin 13.0 - 17.0 g/dL 7.8(L) 7.8(L) 7.6(L)  Hematocrit 39.0 - 52.0 % 25.6(L) 25.8(L) 24.5(L)  Platelets 150 - 400 K/uL 290 247 245    ABG    Component Value Date/Time   PHART 7.347 (L) 02/04/2016 0350   PCO2ART 47.1 02/04/2016 0350   PO2ART 66.4 (L) 02/04/2016 0350   HCO3 25.1 02/04/2016 0350   TCO2 30 02/01/2016 1205   ACIDBASEDEF 0.5 01/25/2016 0345   O2SAT 90.6 02/04/2016 0350    CBG (last 3)   Recent Labs  02/04/16 0317 02/04/16 0849 02/04/16 1158  GLUCAP 193* 187* 190*     Imaging: Dg Chest Port 1 View  Result Date: 02/04/2016 CLINICAL DATA:  Acute respiratory failure and shortness of breath. EXAM: PORTABLE CHEST 1 VIEW COMPARISON:  One-view chest x-ray 02/03/2016 FINDINGS: Endotracheal tube is stable in position. The NG tube courses off the inferior border the film. Bilateral IJ lines remain in place. The heart is enlarged. Atherosclerotic calcifications are present at the aortic arch. Bilateral pleural effusions and basilar airspace disease is similar the prior exam. Moderate pulmonary vascular congestion persists. IMPRESSION: 1. Cardiomegaly with moderate pulmonary vascular congestion,  bilateral pleural effusions, and bibasilar airspace disease is similar to the prior exam. This likely represents congestive heart failure. Infection is not excluded. 2. Support apparatus is stable. 3. Aortic atherosclerosis Electronically Signed   By: San Morelle M.D.   On: 02/04/2016 07:05   Dg Chest Port 1 View  Result Date: 02/03/2016 CLINICAL DATA:  Respiratory failure.  Shortness of breath. EXAM: PORTABLE CHEST 1 VIEW COMPARISON:  Twelve 11/13/2015 . FINDINGS: Endotracheal tube, NG tube, right IJ line in stable position. Cardiomegaly. Low lung volumes with basilar atelectasis. Bibasilar infiltrates noted on today's exam. No pleural effusion or  pneumothorax. IMPRESSION: 1. Lines and tubes in stable position. 2. Low lung volumes with basilar atelectasis. Progressive bibasilar pulmonary infiltrates are noted. Electronically Signed   By: Marcello Moores  Register   On: 02/03/2016 06:56    Studies: CT head 12/17 >>neg acute  Echo >> 01/26/2016>> EF 123456, Grade 1 diastolic dysfunction  Cultures: Blood 12/17 >>ngtd Sputum 12/17 >>normal flora  resp 12/22 >>nml flora  Blood 12/28 > Urine 12/28 >  Sputum 12/28 >  Antitibiotics: zosyn 12/22 >> vanc 12/22 > 2/25, 2/28 >>>  Lines/tubes:  ETT 12/17 >> HD cath R Femoral >> 12/19 Left IJ HD 12/19>>> RIJ 12/26 >> Fem art line 12/27 >    ASSESSMENT / PLAN:   PULMONARY Acute hypoxic/hypercapnic respiratory failure in setting of pulmonary edema. ARDS - increased PEEP/FIO2 12/25 P:   Full vent support Titrate FiO2 / keep peep @ 10-12 No SBTs ETT day 11, will consider trach  CARDIOVASCULAR Pulmonary edema  Hx of HTN, HLD. Hypothermia resolving EF>> 60-65% ( 12/18), no pericardial efusion Hypotension ?adrenal insuff vs sedation/ vent related 12/26 AF-RVR P:  Continue  levophed as needed to maintain MAP  Vaso now  hold home norvasc, lasix, crestor, toprol Continue stress steroids  Amio started 12/26 converted to NSR 12/27, maintain gtt.  RENAL ESRD , now on CRRT, has AVF Goal hourly net neg 100 cc / hr P:   F/u chem  Strict I&O Renal following ,CVVHD ongoing now pulling 50cc/hr as tolerated.  GASTROINTESTINAL Constipation P:   PPI  NPO TF at goal   HEMATOLOGIC Anemia of chronic disease P:  F/u CBC SCD's  Transfuse for HGB >7 Trend coags as needed  INFECTIOUS HCAP >WBC jump to 20k 12/28 P:   ct empiric zosyn  Add back vanco Pan culture 12/28  ENDOCRINE DM   Cortisol=9.8  P:   SSI  Stress dose steroids  NEUROLOGIC Acute metabolic encephalopathy - CT head neg  Agitation  P:   RASS goal 0 to -1 Hold outpt neurontin  Daily WUA  Titrated  clonazepam 1 bid  and increase seroquel 200 BID and attempt Minimise fentanyl gtt & versed gtt   SUMMARY 65 year old male admitted with respiratory failure secondary to PNA requiring intubation. Developed shock requiring pressors. CKD 5 at baseline now CRRT. ARDS vs Edema at this point. CRRT for renal replacement and gentle volume removal. Pressor demands improving 12/28. WBC bumped so adding vanc and sending cultures. Cuff leak. Will eval for trach and if as suspected cannot be done today will need to change ETT.  APP critical care time 35 mins  Georgann Housekeeper, [facility] [place] [facility] Pager [phone] or [phone]  02/04/2016 12:22 PM

## 2016-02-04 NOTE — Progress Notes (Signed)
PCCM Admission Note  Admission date: 01/20/2016 Referring provider: Dr. Sherry Ruffing, ER  CC: feels weak  HPI: 65 yo male brought to ER 12/17 with generalized weakness.  Intubated for hypoxia/ hypercarbia & hypothermia 89 .He has CKD 5- biopsy proven FSGS  and CXR showed pulmonary edema.    Events:  12/17 admit, renal consulted 12/18 CRRT 12/19 Severe agitation   Pulled HD cath out.  12/20 seroquel + klonopin added 12/25 worse hypoxia + BL ASD  -needing PEEP/ increase FIO2 12/26 increased levo  Subjective/Interval Hx:  On fio2 0.70 + PEEP 10 Remains on norepi 11, vasopressin, amiodarone Sedated w fentanyl 250 + versed 6   Vital signs: BP 139/73 (BP Location: Right Arm)   Pulse 88   Temp 99 F (37.2 C) (Oral)   Resp 20   Ht 6\' 1"  (1.854 m)   Wt 130.2 kg (287 lb)   SpO2 96%   BMI 37.87 kg/m   Intake/output: I/O last 3 completed shifts: In: 5806.8 [I.V.:2616.8; Other:30; NG/GT:2960; IV [id number] Out: [id number] [Urine:5; Other:5765]  General: sedated, NAD on vent, CVVHD  Neuro: RASS -3, on fent gtt + versed gtt HEENT: ETT , no pallor, icterus Cardiac: regular, no murmur Chest: resps even non labored on vent, coarse bilaterally, basilar insp crackles  Abd: obese, soft, non tender, decreased bowel sounds Ext: 1+ edema, AVF L arm - bruit  Skin: venous stasis changes   Recent Labs Lab 01/31/16 0500  02/01/16 0432  02/02/16 0443 02/02/16 1515 02/03/16 0457 02/03/16 1550 02/04/16 0315  NA 136  < >  --   < > 137 137 137 135 134*  K 4.9  < >  --   < > 5.0 5.0 5.1 5.0 4.6  CL 99*  < >  --   < > 100* 102 99* 98* 97*  CO2 24  < >  --   < > 25 26 26 25 23   GLUCOSE 133*  < >  --   < > 127* 203* 126* 221* 187*  BUN 37*  < >  --   < > 48* 44* 47* 50* 51*  CREATININE 2.32*  < >  --   < > 2.65* 2.14* 2.21* 2.43* 2.28*  CALCIUM 10.0  < >  --   < > 10.0 9.1 9.8 9.4 9.5  MG 2.8*  --  2.8*  --  2.8*  --  2.9*  --  2.9*  PHOS 4.7*  < >  --   < > 4.2 4.7* 4.4 5.4* 5.3*  < > =  values in this interval not displayed.   Recent Labs Lab 02/02/16 0443 02/03/16 0457 02/04/16 0315  HGB 7.6* 7.8* 7.8*  HCT 24.5* 25.8* 25.6*  WBC 14.2* 13.5* 20.2*  PLT 245 247 290     ABG    Component Value Date/Time   PHART 7.347 (L) 02/04/2016 0350   PCO2ART 47.1 02/04/2016 0350   PO2ART 66.4 (L) 02/04/2016 0350   HCO3 25.1 02/04/2016 0350   TCO2 30 02/01/2016 1205   ACIDBASEDEF 0.5 01/25/2016 0345   O2SAT 90.6 02/04/2016 0350    CBG (last 3)   Recent Labs  02/04/16 0317 02/04/16 0849 02/04/16 1158  GLUCAP 193* 187* 190*     Imaging: Dg Chest Port 1 View  Result Date: 02/04/2016 CLINICAL DATA:  Acute respiratory failure and shortness of breath. EXAM: PORTABLE CHEST 1 VIEW COMPARISON:  One-view chest x-ray 02/03/2016 FINDINGS: Endotracheal tube is stable in position. The NG tube courses off  the inferior border the film. Bilateral IJ lines remain in place. The heart is enlarged. Atherosclerotic calcifications are present at the aortic arch. Bilateral pleural effusions and basilar airspace disease is similar the prior exam. Moderate pulmonary vascular congestion persists. IMPRESSION: 1. Cardiomegaly with moderate pulmonary vascular congestion, bilateral pleural effusions, and bibasilar airspace disease is similar to the prior exam. This likely represents congestive heart failure. Infection is not excluded. 2. Support apparatus is stable. 3. Aortic atherosclerosis Electronically Signed   By: San Morelle M.D.   On: 02/04/2016 07:05   Dg Chest Port 1 View  Result Date: 02/03/2016 CLINICAL DATA:  Respiratory failure.  Shortness of breath. EXAM: PORTABLE CHEST 1 VIEW COMPARISON:  Twelve 11/13/2015 . FINDINGS: Endotracheal tube, NG tube, right IJ line in stable position. Cardiomegaly. Low lung volumes with basilar atelectasis. Bibasilar infiltrates noted on today's exam. No pleural effusion or pneumothorax. IMPRESSION: 1. Lines and tubes in stable position. 2. Low  lung volumes with basilar atelectasis. Progressive bibasilar pulmonary infiltrates are noted. Electronically Signed   By: Marcello Moores  Register   On: 02/03/2016 06:56    Studies: CT head 12/17 >>neg acute  Echo >> 01/25/2016>> EF 123456, Grade 1 diastolic dysfunction  Cultures: Blood 12/17 >>negative  Sputum 12/17 >>normal flora  resp 12/22 >> normal flora   Antitibiotics: 12/22 zosyn >> (planned 12/29)  12/22 vanc >> 12/27   Lines/tubes:  ETT 12/17 >> HD cath R Femoral >> 12/19 Left IJ HD 12/19>>> RIJ 12/26 >> Femoral art line 12/27 >>     ASSESSMENT / PLAN:  Shock of unclear etiology, ? Relative adrenal insuff, now on CRRT  PULMONARY Acute hypoxic/hypercapnic respiratory failure in setting of pulmonary edema. ARDS - increased PEEP/FIO2 12/25 ? HCAP  P:   Full vent support Titrate FiO2 / keep peep @ 10-12 for now  No SBTs until PEEP and Fio2 at goal  Follow CXR  CARDIOVASCULAR Cardiogenic pulmonary edema  Hx of HTN, HLD. Hypothermia, resolved  EF>> 60-65% ( 12/18), no pericardial efusion Hypotension ?adrenal insuff vs sedation/ vent related 12/26 AF-RVR P:  Continue levophed + vasopressin to maintain MAP > 65 hold home norvasc, lasix, crestor, toprol Continue stress steroids  Amio bolus & gtt started 12/26  RENAL ESRD , now on CRRT, has AVF Goal hourly net neg 100 cc / hr P:   F/u chem  Strict I&O Renal following ,CVVHD ongoing, keep even while on levophed   GASTROINTESTINAL Constipation P:   PPI  NPO TF at goal   HEMATOLOGIC Anemia of chronic disease P:  F/u CBC SCD's  Transfuse for HGB >7 Trend coags as needed  INFECTIOUS HCAP P:   Continue zosyn 8 days total (d/c on 12/29)  Dc'd vanc0 12/27 Pct low reassuring  ENDOCRINE DM   Cortisol=9.8  P:   SSI  Stress dose steroids ordered   NEUROLOGIC Acute metabolic encephalopathy - CT head neg  Agitation  P:   RASS goal 0 to -1 holding outpt neurontin  Daily WUA  clonazepam 1 bid  and seroquel 200 BID, attempt Minimize fentanyl gtt & versed gtt   Updated daughter 12/27  Independent Cc time 35 minutes.   Baltazar Apo, MD, PhD 02/04/2016, 12:28 PM  Pulmonary and Critical Care [phone] or if no answer [phone]

## 2016-02-04 NOTE — Procedures (Signed)
Percutaneous Tracheostomy Placement  Consent from family.  Patient sedated, paralyzed and position.  Placed on 100% FiO2 and RR matched.  Area cleaned and draped.  Lidocaine/epi injected.  Skin incision done followed by blunt dissection.  Trachea palpated then punctured, catheter passed and visualized bronchoscopically.  Wire placed and visualized.  Catheter removed.  Airway then crushed and dilated.  Size 6 cuffed shiley trach placed and visualized bronchoscopically well above carina.  Good volume returns.  Patient tolerated the procedure well without complications.  Minimal blood loss.  CXR ordered and pending.  Wesam G. Yacoub, M.D. [facility]. Pager: [phone]. After hours pager: [phone]. 

## 2016-02-04 NOTE — Progress Notes (Signed)
CVVHD Management.  Metabolically stable.   CVP 8.  CXR still shows fluid.  Will try for 50cc/hr.  Add heparin.

## 2016-02-04 NOTE — Progress Notes (Signed)
Pharmacy Antibiotic Note  Miguel [name] is a 65 y.o. male admitted on 01/23/2016 with pneumonia.  Pharmacy has been consulted for Vancomycin and Zosyn dosing. Pt on CRRT  Cx neg shock - pt has multiple lines with bump in WBC today  Previously stopped vanc with last dose 12/26  Plan: Continue Zosyn 3.375g IV Q6  Add vanc 1250 mg q24h (last dose 12/26) Monitor clinical picture, renal function/CRRT status, vanc levels prn F/U new cx?  Height: 6\' 1"  (185.4 cm) Weight: 287 lb (130.2 kg) IBW/kg (Calculated) : 79.9  Temp (24hrs), Avg:98 F (36.7 C), Min:97.4 F (36.3 C), Max:99 F (37.2 C)   Recent Labs Lab 01/31/16 0500  02/01/16 0432  02/02/16 0443 02/02/16 1515 02/03/16 0457 02/03/16 1550 02/04/16 0315  WBC 15.0*  --  13.0*  --  14.2*  --  13.5*  --  20.2*  CREATININE 2.32*  < >  --   < > 2.65* 2.14* 2.21* 2.43* 2.28*  < > = values in this interval not displayed.  Estimated Creatinine Clearance: 44.5 mL/min (by C-G formula based on SCr of 2.28 mg/dL (H)).    No Known Allergies  Levester Fresh, PharmD, BCPS, BCCCP Clinical Pharmacist Clinical phone for 02/04/2016 from 7a-3:30p: [phone] If after 3:30p, please call main pharmacy at: [phone] 02/04/2016 12:44 PM

## 2016-02-04 NOTE — Procedures (Signed)
Bronchoscopy Procedure Note Miguel [name] [id number] 1950-07-15  Procedure: Bronchoscopy Indications: To facilitate percutaneous tracheostomy  Procedure Details Consent: Risks of procedure as well as the alternatives and risks of each were explained to the (patient/caregiver).  Consent for procedure obtained. Time Out: Verified patient identification, verified procedure, site/side was marked, verified correct patient position, special equipment/implants available, medications/allergies/relevent history reviewed, required imaging and test results available.  Performed  In preparation for procedure, patient was given 100% FiO2. Sedation: Benzodiazepines, Muscle relaxants and Etomidate  Airway entered and the following bronchi were examined: RUL, RML, RLL, LUL and LLL.    FOB performed to facilitate percutaneous trach placement. Please refer also to Dr Pura Spice procedure note. I visualized the trach placement in full from the ETT including cannulation of the trachea, serial dilations and trach insertion. There was no injury to the posterior wall of the trachea from the needle or guidewire. FOB was withdrawn and placed via new trach. No evidence bleeding either proximal or distal to the trach. All airways inspected, no evidence significant secretions or endobronchial lesions. A BAL was performed in anterior segment of the RUL for cx data.   Evaluation Hemodynamic Status: BP stable throughout; O2 sats: stable throughout Patient's Current Condition: stable Specimens:  BAL RUL anterior segment  Complications: No apparent complications Patient did tolerate procedure well.   Baltazar Apo, MD, PhD 02/04/2016, 3:43 PM [facility] [phone] or if no answer [phone]

## 2016-02-04 NOTE — Procedures (Signed)
Bedside Tracheostomy Insertion Procedure Note   Patient Details:   Name: Miguel [name] DOB: 1950-12-24 MRN: [id number]  Procedure: Tracheostomy  Pre Procedure Assessment: ET Tube Size:7.5 ET Tube secured at lip (cm):24 Bite block in place: No Breath Sounds: Clear  Post Procedure Assessment: BP 112/68 (BP Location: Right Arm)   Pulse 76   Temp 99 F (37.2 C) (Oral)   Resp 20   Ht 6\' 1"  (1.854 m)   Wt 289 lb (131.1 kg)   SpO2 95%   BMI 38.13 kg/m  O2 sats: stable throughout Complications: No apparent complications Patient did tolerate procedure well Tracheostomy Brand:Shiley Tracheostomy Style:Cuffed Tracheostomy Size: 6.0 Tracheostomy Secured [id number] Tracheostomy Placement Confirmation:Trach cuff visualized and in place    Phillis Knack [facility] 02/04/2016, 3:50 PM

## 2016-02-04 NOTE — Progress Notes (Signed)
Nutrition Follow-up  DOCUMENTATION CODES:   Morbid obesity  INTERVENTION:  Diet advancement per SLP/MD  If unable to advance diet, recommend placing Cortrak for short term nutrition support.    NUTRITION DIAGNOSIS:   Inadequate oral intake related to inability to eat as evidenced by NPO status.  ongoing  GOAL:   Provide needs based on ASPEN/SCCM guidelines  Met/exceeded  MONITOR:   Vent status, Labs, TF tolerance, I & O's  REASON FOR ASSESSMENT:   Rounds, Consult Enteral/tube feeding initiation and management  ASSESSMENT:   65 yo male brought to ER 12/17 with generalized weakness. ABG showed hypercapnia. He was tried on Bipap w/o significant improvement. CXR showed pulmonary edema. Required intubation in ER.  Pt's weight has decreased 37 lbs in the past week. He remains intubated and on CVVHD. Per RN, plan is for patient to have trach placed and tube feeding is now on hold. Energy and protein needs re-estimated for new weight and for post-extubation. Will need re-assessment once pt receives trach to assess PO diet advancement vs. TF needs.   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  Estimated Nutritional Needs:   Kcal:  9983-3825 (needs when extubated: 2300-2500 kcal)  Protein:  167-209 grams (needs when extubated: 115-135 grams)  Fluid:  2.2 L  EDUCATION NEEDS:   No education needs identified at this time  Scarlette Ar RD, CSP, LDN Inpatient Clinical Dietitian Pager: [phone] After Hours Pager: [phone]

## 2016-02-05 ENCOUNTER — Inpatient Hospital Stay (HOSPITAL_COMMUNITY): Payer: Medicare Other

## 2016-02-05 LAB — GLUCOSE, CAPILLARY
GLUCOSE-CAPILLARY: 124 mg/dL — AB (ref 65–99)
GLUCOSE-CAPILLARY: 128 mg/dL — AB (ref 65–99)
GLUCOSE-CAPILLARY: 118 mg/dL — AB (ref 65–99)
GLUCOSE-CAPILLARY: 116 mg/dL — AB (ref 65–99)
GLUCOSE-CAPILLARY: 139 mg/dL — AB (ref 65–99)
Glucose-Capillary: 155 mg/dL — ABNORMAL HIGH (ref 65–99)

## 2016-02-05 LAB — POCT ACTIVATED CLOTTING TIME
ACTIVATED CLOTTING TIME: 147 s
ACTIVATED CLOTTING TIME: 164 s
ACTIVATED CLOTTING TIME: 175 s
ACTIVATED CLOTTING TIME: 175 s
Activated Clotting Time: 153 seconds
Activated Clotting Time: 153 seconds
Activated Clotting Time: 158 seconds
Activated Clotting Time: 169 seconds
Activated Clotting Time: 180 seconds
Activated Clotting Time: 181 seconds
Activated Clotting Time: 208 seconds
Activated Clotting Time: 213 seconds
Activated Clotting Time: 224 seconds

## 2016-02-05 LAB — MAGNESIUM: Magnesium: 2.8 mg/dL — ABNORMAL HIGH (ref 1.7–2.4)

## 2016-02-05 LAB — RENAL FUNCTION PANEL
ANION GAP: 13 (ref 5–15)
Albumin: 2.4 g/dL — ABNORMAL LOW (ref 3.5–5.0)
Albumin: 2.4 g/dL — ABNORMAL LOW (ref 3.5–5.0)
Anion gap: 12 (ref 5–15)
BUN: 39 mg/dL — AB (ref 6–20)
BUN: 45 mg/dL — AB (ref 6–20)
CALCIUM: 9.9 mg/dL (ref 8.9–10.3)
CHLORIDE: 99 mmol/L — AB (ref 101–111)
CO2: 23 mmol/L (ref 22–32)
CO2: 23 mmol/L (ref 22–32)
CREATININE: 2.32 mg/dL — AB (ref 0.61–1.24)
CREATININE: 2.86 mg/dL — AB (ref 0.61–1.24)
Calcium: 9.6 mg/dL (ref 8.9–10.3)
Chloride: 103 mmol/L (ref 101–111)
GFR calc Af Amer: 25 mL/min — ABNORMAL LOW (ref >60)
GFR calc non Af Amer: 27 mL/min — ABNORMAL LOW (ref >60)
GFR, EST AFRICAN AMERICAN: 32 mL/min — AB (ref >60)
GFR, EST NON AFRICAN AMERICAN: 21 mL/min — AB (ref >60)
Glucose, Bld: 151 mg/dL — ABNORMAL HIGH (ref 65–99)
Glucose, Bld: 126 mg/dL — ABNORMAL HIGH (ref 65–99)
PHOSPHORUS: 4.4 mg/dL (ref 2.5–4.6)
PHOSPHORUS: 4.9 mg/dL — AB (ref 2.5–4.6)
POTASSIUM: 4 mmol/L (ref 3.5–5.1)
Potassium: 3.9 mmol/L (ref 3.5–5.1)
Sodium: 135 mmol/L (ref 135–145)
Sodium: 138 mmol/L (ref 135–145)

## 2016-02-05 LAB — CBC
HEMATOCRIT: 23.8 % — AB (ref 39.0–52.0)
Hemoglobin: 7.5 g/dL — ABNORMAL LOW (ref 13.0–17.0)
MCH: 28.6 pg (ref 26.0–34.0)
MCHC: 31.5 g/dL (ref 30.0–36.0)
MCV: 90.8 fL (ref 78.0–100.0)
PLATELETS: 261 10*3/uL (ref 150–400)
RBC: 2.62 MIL/uL — AB (ref 4.22–5.81)
RDW: 18.6 % — AB (ref 11.5–15.5)
WBC: 18.3 10*3/uL — AB (ref 4.0–10.5)

## 2016-02-05 LAB — ACID FAST SMEAR (AFB, MYCOBACTERIA): Acid Fast Smear: NEGATIVE

## 2016-02-05 LAB — APTT: APTT: 54 s — AB (ref 24–36)

## 2016-02-05 MED ORDER — PRISMASOL BGK 4/2.5 32-4-2.5 MEQ/L IV SOLN
INTRAVENOUS | Status: DC
  Filled 2016-02-05: qty 5000

## 2016-02-05 MED ORDER — CLONAZEPAM 0.5 MG PO TABS
0.5000 mg | ORAL_TABLET | Freq: Two times a day (BID) | ORAL | Status: DC
  Administered 2016-02-06 – 2016-02-09 (×6): 0.5 mg
  Filled 2016-02-05 (×6): qty 1

## 2016-02-05 MED ORDER — PRISMASOL BGK 0/2.5 32-2.5 MEQ/L IV SOLN: INTRAVENOUS | Status: DC

## 2016-02-05 MED ORDER — PRISMASOL BGK 0/2.5 32-2.5 MEQ/L IV SOLN
INTRAVENOUS | Status: DC
  Administered 2016-02-05: 17:00:00 via INTRAVENOUS_CENTRAL
  Filled 2016-02-05: qty 5000

## 2016-02-05 MED ORDER — PRISMASOL BGK 4/2.5 32-4-2.5 MEQ/L IV SOLN
INTRAVENOUS | Status: DC
  Administered 2016-02-05 – 2016-02-09 (×29): via INTRAVENOUS_CENTRAL
  Filled 2016-02-05 (×46): qty 5000

## 2016-02-05 MED ORDER — PRISMASOL BGK 0/2.5 32-2.5 MEQ/L IV SOLN
INTRAVENOUS | Status: DC
  Administered 2016-02-05 – 2016-02-06 (×2): via INTRAVENOUS_CENTRAL
  Filled 2016-02-05 (×2): qty 5000

## 2016-02-05 NOTE — Progress Notes (Signed)
PCCM Admission Note  Admission date: 01/19/2016 Referring provider: Dr. Sherry Ruffing, ER  CC: feels weak  HPI: 65 yo male brought to ER 12/17 with generalized weakness.  Intubated for hypoxia/ hypercarbia & hypothermia 89 .He has CKD 5- biopsy proven FSGS  and CXR showed pulmonary edema.    Events:  12/17 admit, renal consulted 12/18 CRRT 12/19 Severe agitation   Pulled HD cath out.  12/20 seroquel + klonopin added 12/25 worse hypoxia + BL ASD  -needing PEEP/ increase FIO2 12/26 increased levo  Subjective/Interval Hx:  On fio2 0.60 + PEEP 10 Remains on norepi, vasopressin, amiodarone Bedside trach placed 12/28 L IJ HD catheter not functioning   Vital signs: BP 113/60   Pulse 72   Temp 97.3 F (36.3 C) (Oral)   Resp 20   Ht 6\' 1"  (1.854 m)   Wt 123.8 kg (273 lb)   SpO2 92%   BMI 36.02 kg/m   Intake/output: I/O last 3 completed shifts: In: 5121.5 [I.V.:2871.5; Other:40; NG/GT:1560; IV Piggyback:650] Out: [id number] [Urine:11; [id number]  General: sedated, NAD on vent, CVVHD  Neuro: RASS -3, on fent gtt + versed gtt HEENT: ETT , no pallor, icterus Cardiac: regular, no murmur Chest: resps even non labored on vent, coarse bilaterally, basilar insp crackles  Abd: obese, soft, non tender, decreased bowel sounds Ext: 1+ edema, AVF L arm - bruit  Skin: venous stasis changes   Recent Labs Lab 02/01/16 0432  02/02/16 0443  02/03/16 0457 02/03/16 1550 02/04/16 0315 02/04/16 1600 02/05/16 0322 02/05/16 0323  NA  --   < > 137  < > 137 135 134* 136  --  135  K  --   < > 5.0  < > 5.1 5.0 4.6 4.6  --  3.9  CL  --   < > 100*  < > 99* 98* 97* 99*  --  99*  CO2  --   < > 25  < > 26 25 23 23   --  23  GLUCOSE  --   < > 127*  < > 126* 221* 187* 197*  --  151*  BUN  --   < > 48*  < > 47* 50* 51* 45*  --  39*  CREATININE  --   < > 2.65*  < > 2.21* 2.43* 2.28* 2.25*  --  2.32*  CALCIUM  --   < > 10.0  < > 9.8 9.4 9.5 9.6  --  9.9  MG 2.8*  --  2.8*  --  2.9*  --  2.9*  --  2.8*  --    PHOS  --   < > 4.2  < > 4.4 5.4* 5.3* 4.4  --  4.4  < > = values in this interval not displayed.   Recent Labs Lab 02/03/16 0457 02/04/16 0315 02/05/16 0322  HGB 7.8* 7.8* 7.5*  HCT 25.8* 25.6* 23.8*  WBC 13.5* 20.2* 18.3*  PLT 247 290 261     ABG    Component Value Date/Time   PHART 7.347 (L) 02/04/2016 0350   PCO2ART 47.1 02/04/2016 0350   PO2ART 66.4 (L) 02/04/2016 0350   HCO3 25.1 02/04/2016 0350   TCO2 30 02/01/2016 1205   ACIDBASEDEF 0.5 01/25/2016 0345   O2SAT 90.6 02/04/2016 0350    CBG (last 3)   Recent Labs  02/05/16 0323 02/05/16 0726 02/05/16 1104  GLUCAP 155* 124* 128*     Imaging: Dg Chest Port 1 View  Result Date: 02/05/2016 CLINICAL DATA:  Adult respiratory distress syndrome EXAM: PORTABLE CHEST 1 VIEW COMPARISON:  February 04, 2016 FINDINGS: Tracheostomy catheter tip is 8.1 cm above the carina. Central catheter tip is in the superior vena cava. Left jugular catheter tip is at the expected junction of the left jugular vein and left subclavian vein. No pneumothorax. There is patchy airspace consolidation in the left lower lobe, stable. There is mild right base atelectasis. There is new patchy opacity in the left mid lung. Heart size and pulmonary vascularity are normal. No adenopathy. No bone lesions. IMPRESSION: New patchy airspace opacity in the left mid lung. Stable patchy consolidation left base. Stable atelectasis right base. Stable cardiac silhouette. Tube and catheter positions as described without pneumothorax. Electronically Signed   By: Lowella Grip III M.D.   On: 02/05/2016 07:59   Dg Chest Port 1 View  Result Date: 02/04/2016 CLINICAL DATA:  Tracheostomy EXAM: PORTABLE CHEST 1 VIEW COMPARISON:  02/04/2016 FINDINGS: Interval placement of tracheostomy in satisfactory position. No pneumothorax. Left jugular central venous catheter in the left jugular vein. Right jugular catheter in the SVC. Bibasilar airspace disease shows mild interval  improvement. No significant edema or effusion IMPRESSION: Tracheostomy in good position Mild improvement in bibasilar atelectasis/infiltrate. Electronically Signed   By: Franchot Gallo M.D.   On: 02/04/2016 17:09   Dg Chest Port 1 View  Result Date: 02/04/2016 CLINICAL DATA:  Acute respiratory failure and shortness of breath. EXAM: PORTABLE CHEST 1 VIEW COMPARISON:  One-view chest x-ray 02/03/2016 FINDINGS: Endotracheal tube is stable in position. The NG tube courses off the inferior border the film. Bilateral IJ lines remain in place. The heart is enlarged. Atherosclerotic calcifications are present at the aortic arch. Bilateral pleural effusions and basilar airspace disease is similar the prior exam. Moderate pulmonary vascular congestion persists. IMPRESSION: 1. Cardiomegaly with moderate pulmonary vascular congestion, bilateral pleural effusions, and bibasilar airspace disease is similar to the prior exam. This likely represents congestive heart failure. Infection is not excluded. 2. Support apparatus is stable. 3. Aortic atherosclerosis Electronically Signed   By: San Morelle M.D.   On: 02/04/2016 07:05    Studies: CT head 12/17 >>neg acute  Echo >> 02/07/2016>> EF [id number], Grade 1 diastolic dysfunction  Cultures: Blood 12/17 >>negative  Sputum 12/17 >>normal flora  resp 12/22 >> normal flora   Antitibiotics: 12/22 zosyn >> 12/29 12/22 vanc >> 12/27   Lines/tubes:  ETT 12/17 >> HD cath R Femoral >> 12/19 Left IJ HD 12/19>>> RIJ 12/26 >> Femoral art line 12/27 >>     ASSESSMENT / PLAN:  Shock of unclear etiology, ? Relative adrenal insuff, now on CRRT  PULMONARY Acute hypoxic/hypercapnic respiratory failure in setting of pulmonary edema. ARDS - increased PEEP/FIO2 12/25 ? HCAP  P:   Full vent support Titrate FiO2 / keep peep @ 10-12 for now  No SBTs until PEEP and Fio2 at goal  Follow CXR  CARDIOVASCULAR Cardiogenic pulmonary edema  Hx of HTN,  HLD. Hypothermia, resolved  EF>> 60-65% ( 12/18), no pericardial efusion Hypotension ?adrenal insuff vs sedation/ vent related 12/26 AF-RVR P:  Continue levophed + vasopressin to maintain MAP > 65 hold home norvasc, lasix, crestor, toprol Continue stress steroids  Amio bolus & gtt started 12/26  RENAL ESRD , now on CRRT, has AVF Goal hourly net neg 100 cc / hr P:   F/u chem  Strict I&O Renal following ,CVVHD ongoing, keep even while on levophed  May need to change HD catheter if non-functional  GASTROINTESTINAL Constipation P:  PPI  NPO TF at goal   HEMATOLOGIC Anemia of chronic disease P:  F/u CBC SCD's  Transfuse for HGB >7 Trend coags as needed  INFECTIOUS HCAP P:   Completed zosyn x 8 days 12/29 Dc'd vanc0 12/27 Pct low reassuring  ENDOCRINE DM   Cortisol=9.8  P:   SSI  Stress dose steroids ordered   NEUROLOGIC Acute metabolic encephalopathy - CT head neg  Agitation  P:   RASS goal 0 to -1 holding outpt neurontin  Daily WUA  clonazepam 1 bid and seroquel 200 BID, attempt Minimize fentanyl gtt & versed gtt   Updated daughter 12/27  Independent Cc time 35 minutes.   Baltazar Apo, MD, PhD 02/05/2016, 2:10 PM [place] [phone] or if no answer [phone]

## 2016-02-05 NOTE — Progress Notes (Signed)
Notified MD Mercy Moore in regards to Left HD Cath malfunctioning. Blue port will not aspirate blood back but flushes. Red port works fine. MD ordered to try cath flow. If this is not successful, then patient will need new HD Catheter. Notified MD Byrum in regards to also and said okay for cath flow per IV team. CRRT off since 1123. Patient family aware as well. Will continue to monitor and assess.

## 2016-02-05 NOTE — Care Management Note (Addendum)
Case Management Note  Patient Details  Name: Miguel [name] MRN: [id number] Date of Birth: 11-13-1950  Pt admitted with acute on chronic resp distress                    Action/Plan:  No family at bedside.  Pt is on the ventilator and CRRT.  CM will continue to follow for discharge needs   Expected Discharge Date:   (unknown)               Expected Discharge Plan:     In-House Referral:     Discharge planning Services  CM Consult  Post Acute Care Choice:    Choice offered to:     DME Arranged:    DME Agency:     HH Arranged:    HH Agency:     Status of Service:  In process, will continue to follow  If discussed at Long Length of Stay Meetings, dates discussed:    Additional Comments: 02/05/2016 Elenor Quinones, RN, BSN [phone] Pt trached yesterday - attending anticipates that pt will wean quickly.  Pt remains on CRRT.   CM will continue to follow for discharge needs.  Maryclare Labrador, RN 02/05/2016, 2:00 PM

## 2016-02-05 NOTE — Progress Notes (Signed)
CVVHD Management.  Metabolically stable.   On vaso and levo.  CVP 6.  Will try for 100cc/hr, if BP decreases then will  back off.  + bruit in AVF.

## 2016-02-06 ENCOUNTER — Inpatient Hospital Stay (HOSPITAL_COMMUNITY): Payer: Medicare Other

## 2016-02-06 LAB — RENAL FUNCTION PANEL
ANION GAP: 12 (ref 5–15)
Albumin: 2.4 g/dL — ABNORMAL LOW (ref 3.5–5.0)
Albumin: 2.5 g/dL — ABNORMAL LOW (ref 3.5–5.0)
Anion gap: 14 (ref 5–15)
BUN: 33 mg/dL — AB (ref 6–20)
BUN: 36 mg/dL — ABNORMAL HIGH (ref 6–20)
CHLORIDE: 102 mmol/L (ref 101–111)
CO2: 23 mmol/L (ref 22–32)
CO2: 23 mmol/L (ref 22–32)
CREATININE: 2.21 mg/dL — AB (ref 0.61–1.24)
Calcium: 9.7 mg/dL (ref 8.9–10.3)
Calcium: 9.8 mg/dL (ref 8.9–10.3)
Chloride: 104 mmol/L (ref 101–111)
Creatinine, Ser: 2.76 mg/dL — ABNORMAL HIGH (ref 0.61–1.24)
GFR calc Af Amer: 34 mL/min — ABNORMAL LOW (ref >60)
GFR calc Af Amer: 26 mL/min — ABNORMAL LOW (ref >60)
GFR calc non Af Amer: 22 mL/min — ABNORMAL LOW (ref >60)
GFR, EST NON AFRICAN AMERICAN: 29 mL/min — AB (ref >60)
GLUCOSE: 139 mg/dL — AB (ref 65–99)
GLUCOSE: 113 mg/dL — AB (ref 65–99)
POTASSIUM: 3.3 mmol/L — AB (ref 3.5–5.1)
Phosphorus: 4 mg/dL (ref 2.5–4.6)
Phosphorus: 4.6 mg/dL (ref 2.5–4.6)
Potassium: 3.6 mmol/L (ref 3.5–5.1)
Sodium: 139 mmol/L (ref 135–145)
Sodium: 139 mmol/L (ref 135–145)

## 2016-02-06 LAB — GLUCOSE, CAPILLARY
GLUCOSE-CAPILLARY: 130 mg/dL — AB (ref 65–99)
GLUCOSE-CAPILLARY: 118 mg/dL — AB (ref 65–99)
GLUCOSE-CAPILLARY: 94 mg/dL (ref 65–99)
Glucose-Capillary: 76 mg/dL (ref 65–99)
Glucose-Capillary: 117 mg/dL — ABNORMAL HIGH (ref 65–99)

## 2016-02-06 LAB — CBC
HEMATOCRIT: 23.7 % — AB (ref 39.0–52.0)
HEMOGLOBIN: 7.3 g/dL — AB (ref 13.0–17.0)
MCH: 28 pg (ref 26.0–34.0)
MCHC: 30.8 g/dL (ref 30.0–36.0)
MCV: 90.8 fL (ref 78.0–100.0)
Platelets: 299 10*3/uL (ref 150–400)
RBC: 2.61 MIL/uL — ABNORMAL LOW (ref 4.22–5.81)
RDW: 19.6 % — ABNORMAL HIGH (ref 11.5–15.5)
WBC: 20.3 10*3/uL — ABNORMAL HIGH (ref 4.0–10.5)

## 2016-02-06 LAB — POCT ACTIVATED CLOTTING TIME
ACTIVATED CLOTTING TIME: 219 s
ACTIVATED CLOTTING TIME: 224 s
ACTIVATED CLOTTING TIME: 230 s
Activated Clotting Time: 208 seconds

## 2016-02-06 LAB — CULTURE, BAL-QUANTITATIVE W GRAM STAIN

## 2016-02-06 LAB — MAGNESIUM: Magnesium: 2.7 mg/dL — ABNORMAL HIGH (ref 1.7–2.4)

## 2016-02-06 LAB — CULTURE, BAL-QUANTITATIVE: CULTURE: NO GROWTH

## 2016-02-06 LAB — APTT: aPTT: 106 seconds — ABNORMAL HIGH (ref 24–36)

## 2016-02-06 MED ORDER — FENTANYL 2500MCG IN NS 250ML (10MCG/ML) PREMIX INFUSION
25.0000 ug/h | INTRAVENOUS | Status: DC
  Administered 2016-02-06: 400 ug/h via INTRAVENOUS
  Administered 2016-02-07: 325 ug/h via INTRAVENOUS
  Administered 2016-02-07: 400 ug/h via INTRAVENOUS
  Administered 2016-02-07: 325.2 ug/h via INTRAVENOUS
  Administered 2016-02-08 (×2): 325 ug/h via INTRAVENOUS
  Administered 2016-02-09: 300 ug/h via INTRAVENOUS
  Administered 2016-02-09: 400 ug/h via INTRAVENOUS
  Administered 2016-02-09: 325 ug/h via INTRAVENOUS
  Filled 2016-02-06 (×10): qty 250

## 2016-02-06 MED ORDER — ATROPINE SULFATE 1 MG/10ML IJ SOSY
PREFILLED_SYRINGE | INTRAMUSCULAR | Status: AC
  Filled 2016-02-06: qty 10

## 2016-02-06 MED ORDER — PRISMASOL BGK 4/2.5 32-4-2.5 MEQ/L IV SOLN
INTRAVENOUS | Status: DC
  Administered 2016-02-06 – 2016-02-09 (×4): via INTRAVENOUS_CENTRAL
  Filled 2016-02-06 (×7): qty 5000

## 2016-02-06 MED ORDER — POTASSIUM CHLORIDE 2 MEQ/ML IV SOLN
30.0000 meq | Freq: Once | INTRAVENOUS | Status: AC
  Administered 2016-02-06: 30 meq via INTRAVENOUS
  Filled 2016-02-06: qty 15

## 2016-02-06 MED ORDER — "THROMBI-PAD 3""X3"" EX PADS"
1.0000 | MEDICATED_PAD | Freq: Once | CUTANEOUS | Status: AC
  Administered 2016-02-06: 1 via TOPICAL
  Filled 2016-02-06: qty 1

## 2016-02-06 MED ORDER — PRISMASOL BGK 4/2.5 32-4-2.5 MEQ/L IV SOLN
INTRAVENOUS | Status: DC
  Administered 2016-02-06 – 2016-02-09 (×2): via INTRAVENOUS_CENTRAL
  Filled 2016-02-06 (×4): qty 5000

## 2016-02-06 NOTE — Progress Notes (Signed)
CVVHD Management.  Metabolically stable.   On levo and vaso.  CVP 3.  Pulling 50-100cc/hr but if BP drops will keep even.   Currently off heparin due to bleeding from trach.  AVF bruit difficult to hear.

## 2016-02-06 NOTE — Progress Notes (Signed)
PCCM Admission Note  Admission date: 02/01/2016 Referring provider: Dr. Sherry Ruffing, ER  CC: feels weak  HPI: 65 yo male brought to ER 12/17 with generalized weakness.  Intubated for hypoxia/ hypercarbia & hypothermia 89 .He has CKD 5- biopsy proven FSGS  and CXR showed pulmonary edema.    Events:  12/17 admit, renal consulted 12/18 CRRT 12/19 Severe agitation   Pulled HD cath out.  12/20 seroquel + klonopin added 12/25 worse hypoxia + BL ASD  -needing PEEP/ increase FIO2 12/26 increased levo  Subjective/Interval Hx:  Bleeding from trach site, heparin stopped in CVVHD Remains on low dose pressors.    Vital signs: BP 92/69   Pulse 89   Temp 97.1 F (36.2 C) (Oral)   Resp 20   Ht 6\' 1"  (1.854 m)   Wt 120.7 kg (266 lb)   SpO2 94%   BMI 35.09 kg/m   Intake/output: I/O last 3 completed shifts: In: 2977.5 [I.V.:2747.5; Other:30; IV Piggyback:200] Out: 4306 [Urine:6; Other:4300]  General: sedated, NAD on vent, CVVHD  Neuro: RASS -2, on fent gtt + versed gtt HEENT: trach with some oozing around stoma, no pallor, icterus Cardiac: regular, no murmur Chest: resps even non labored on vent, coarse bilaterally, basilar insp crackles  Abd: obese, soft, non tender, decreased bowel sounds Ext: 1+ edema, AVF L arm - bruit  Skin: venous stasis changes   Recent Labs Lab 02/02/16 0443  02/03/16 0457  02/04/16 0315 02/04/16 1600 02/05/16 0322 02/05/16 0323 02/05/16 1704 02/06/16 0400  NA 137  < > 137  < > 134* 136  --  135 138 139  K 5.0  < > 5.1  < > 4.6 4.6  --  3.9 4.0 3.6  CL 100*  < > 99*  < > 97* 99*  --  99* 103 102  CO2 25  < > 26  < > 23 23  --  23 23 23   GLUCOSE 127*  < > 126*  < > 187* 197*  --  151* 126* 139*  BUN 48*  < > 47*  < > 51* 45*  --  39* 45* 33*  CREATININE 2.65*  < > 2.21*  < > 2.28* 2.25*  --  2.32* 2.86* 2.21*  CALCIUM 10.0  < > 9.8  < > 9.5 9.6  --  9.9 9.6 9.7  MG 2.8*  --  2.9*  --  2.9*  --  2.8*  --   --  2.7*  PHOS 4.2  < > 4.4  < > 5.3* 4.4   --  4.4 4.9* 4.0  < > = values in this interval not displayed.   Recent Labs Lab 02/04/16 0315 02/05/16 0322 02/06/16 0400  HGB 7.8* 7.5* 7.3*  HCT 25.6* 23.8* 23.7*  WBC 20.2* 18.3* 20.3*  PLT 290 261 299     ABG    Component Value Date/Time   PHART 7.347 (L) 02/04/2016 0350   PCO2ART 47.1 02/04/2016 0350   PO2ART 66.4 (L) 02/04/2016 0350   HCO3 25.1 02/04/2016 0350   TCO2 30 02/01/2016 1205   ACIDBASEDEF 0.5 01/25/2016 0345   O2SAT 90.6 02/04/2016 0350    CBG (last 3)   Recent Labs  02/05/16 2314 02/06/16 0343 02/06/16 0854  GLUCAP 139* 130* 118*     Imaging: Dg Chest Port 1 View  Result Date: 02/06/2016 CLINICAL DATA:  Acute respiratory failure EXAM: PORTABLE CHEST 1 VIEW COMPARISON:  02/05/2016 FINDINGS: Tracheostomy and right central line remain in place, unchanged. There  is cardiomegaly with vascular congestion, low lung volumes, and bilateral perihilar and lower lobe opacities. Possible small layering effusions. No acute bony abnormality. IMPRESSION: Low lung volumes with stable perihilar and lower lobe opacities. Possible small layering effusions. Electronically Signed   By: Rolm Baptise M.D.   On: 02/06/2016 08:10   Dg Chest Port 1 View  Result Date: 02/05/2016 CLINICAL DATA:  Adult respiratory distress syndrome EXAM: PORTABLE CHEST 1 VIEW COMPARISON:  February 04, 2016 FINDINGS: Tracheostomy catheter tip is 8.1 cm above the carina. Central catheter tip is in the superior vena cava. Left jugular catheter tip is at the expected junction of the left jugular vein and left subclavian vein. No pneumothorax. There is patchy airspace consolidation in the left lower lobe, stable. There is mild right base atelectasis. There is new patchy opacity in the left mid lung. Heart size and pulmonary vascularity are normal. No adenopathy. No bone lesions. IMPRESSION: New patchy airspace opacity in the left mid lung. Stable patchy consolidation left base. Stable atelectasis  right base. Stable cardiac silhouette. Tube and catheter positions as described without pneumothorax. Electronically Signed   By: Lowella Grip III M.D.   On: 02/05/2016 07:59   Dg Chest Port 1 View  Result Date: 02/04/2016 CLINICAL DATA:  Tracheostomy EXAM: PORTABLE CHEST 1 VIEW COMPARISON:  02/04/2016 FINDINGS: Interval placement of tracheostomy in satisfactory position. No pneumothorax. Left jugular central venous catheter in the left jugular vein. Right jugular catheter in the SVC. Bibasilar airspace disease shows mild interval improvement. No significant edema or effusion IMPRESSION: Tracheostomy in good position Mild improvement in bibasilar atelectasis/infiltrate. Electronically Signed   By: Franchot Gallo M.D.   On: 02/04/2016 17:09    Studies: CT head 12/17 >>neg acute  Echo >> 02/01/2016>> EF 123456, Grade 1 diastolic dysfunction  Cultures: Blood 12/17 >>negative  Sputum 12/17 >>normal flora  resp 12/22 >> normal flora   Antitibiotics: 12/22 zosyn >> 12/29 12/22 vanc >> 12/27   Lines/tubes:  ETT 12/17 >> HD cath R Femoral >> 12/19 Left IJ HD 12/19>>> RIJ 12/26 >> Femoral art line 12/27 >>     ASSESSMENT / PLAN:  Shock of unclear etiology, possibly sepsis, ? Relative adrenal insuff, now on CRRT  PULMONARY Acute hypoxic/hypercapnic respiratory failure in setting of pulmonary edema. ARDS - increased PEEP/FIO2 12/25 ? HCAP  P:   Full vent support Titrate FiO2 / keep peep 10 for now No SBTs until PEEP and Fio2 at goal  Follow CXR  CARDIOVASCULAR Cardiogenic pulmonary edema  Hx of HTN, HLD. Hypothermia, resolved  EF>> 60-65% ( 12/18), no pericardial efusion Hypotension ?adrenal insuff vs sedation/ vent related 12/26 AF-RVR P:  Continue levophed + vasopressin to maintain MAP > 65 hold home norvasc, lasix, crestor, toprol Continue stress steroids  Amio bolus & gtt started 12/26  RENAL ESRD , now on CRRT, has AVF Goal hourly net neg 100 cc / hr P:    F/u chem  Strict I&O Renal following ,CVVHD ongoing, keep even while on levophed   GASTROINTESTINAL Constipation P:   PPI  NPO TF at goal   HEMATOLOGIC Anemia of chronic disease Bleeding from trach site P:  F/u CBC SCD's  Transfuse for HGB >7 Thrombi-pad to trach stoma, heparin on hold  INFECTIOUS HCAP P:   Completed zosyn x 8 days 12/29 Dc'd vanc0 12/27 Pct low reassuring  ENDOCRINE DM   Cortisol=9.8  P:   SSI  Stress dose steroids ordered   NEUROLOGIC Acute metabolic encephalopathy - CT head  neg  Agitation  P:   RASS goal 0 to -1 holding outpt neurontin  Daily WUA  Will replace OGT to allow clonazepam 1 bid and seroquel 200 BID as ordered, attempt Minimize fentanyl gtt & versed gtt   Updated daughter 12/27  Independent Cc time 35 minutes.   Baltazar Apo, MD, PhD 02/06/2016, 11:45 AM [place] [facility] [phone] or if no answer [phone]

## 2016-02-07 LAB — RENAL FUNCTION PANEL
ALBUMIN: 2.4 g/dL — AB (ref 3.5–5.0)
ANION GAP: 15 (ref 5–15)
Albumin: 2.3 g/dL — ABNORMAL LOW (ref 3.5–5.0)
Anion gap: 12 (ref 5–15)
BUN: 39 mg/dL — AB (ref 6–20)
BUN: 35 mg/dL — ABNORMAL HIGH (ref 6–20)
CHLORIDE: 102 mmol/L (ref 101–111)
CO2: 24 mmol/L (ref 22–32)
CO2: 21 mmol/L — AB (ref 22–32)
Calcium: 9.3 mg/dL (ref 8.9–10.3)
Calcium: 9.2 mg/dL (ref 8.9–10.3)
Chloride: 101 mmol/L (ref 101–111)
Creatinine, Ser: 2.66 mg/dL — ABNORMAL HIGH (ref 0.61–1.24)
Creatinine, Ser: 2.07 mg/dL — ABNORMAL HIGH (ref 0.61–1.24)
GFR calc Af Amer: 27 mL/min — ABNORMAL LOW (ref >60)
GFR calc Af Amer: 36 mL/min — ABNORMAL LOW (ref >60)
GFR calc non Af Amer: 23 mL/min — ABNORMAL LOW (ref >60)
GFR calc non Af Amer: 31 mL/min — ABNORMAL LOW (ref >60)
GLUCOSE: 181 mg/dL — AB (ref 65–99)
GLUCOSE: 197 mg/dL — AB (ref 65–99)
PHOSPHORUS: 4.4 mg/dL (ref 2.5–4.6)
POTASSIUM: 4 mmol/L (ref 3.5–5.1)
POTASSIUM: 3.9 mmol/L (ref 3.5–5.1)
Phosphorus: 5.2 mg/dL — ABNORMAL HIGH (ref 2.5–4.6)
Sodium: 138 mmol/L (ref 135–145)
Sodium: 137 mmol/L (ref 135–145)

## 2016-02-07 LAB — GLUCOSE, CAPILLARY
GLUCOSE-CAPILLARY: 196 mg/dL — AB (ref 65–99)
GLUCOSE-CAPILLARY: 191 mg/dL — AB (ref 65–99)
GLUCOSE-CAPILLARY: 160 mg/dL — AB (ref 65–99)
Glucose-Capillary: 175 mg/dL — ABNORMAL HIGH (ref 65–99)
Glucose-Capillary: 176 mg/dL — ABNORMAL HIGH (ref 65–99)
Glucose-Capillary: 180 mg/dL — ABNORMAL HIGH (ref 65–99)
Glucose-Capillary: 209 mg/dL — ABNORMAL HIGH (ref 65–99)

## 2016-02-07 LAB — CBC
HCT: 21.8 % — ABNORMAL LOW (ref 39.0–52.0)
HEMOGLOBIN: 6.8 g/dL — AB (ref 13.0–17.0)
MCH: 28.9 pg (ref 26.0–34.0)
MCHC: 31.2 g/dL (ref 30.0–36.0)
MCV: 92.8 fL (ref 78.0–100.0)
Platelets: 295 10*3/uL (ref 150–400)
RBC: 2.35 MIL/uL — ABNORMAL LOW (ref 4.22–5.81)
RDW: 20.2 % — ABNORMAL HIGH (ref 11.5–15.5)
WBC: 20.4 10*3/uL — ABNORMAL HIGH (ref 4.0–10.5)

## 2016-02-07 LAB — POCT ACTIVATED CLOTTING TIME
ACTIVATED CLOTTING TIME: 125 s
ACTIVATED CLOTTING TIME: 142 s
ACTIVATED CLOTTING TIME: 147 s
ACTIVATED CLOTTING TIME: 164 s
Activated Clotting Time: 131 seconds
Activated Clotting Time: 164 seconds

## 2016-02-07 LAB — PREPARE RBC (CROSSMATCH)

## 2016-02-07 LAB — HEMOGLOBIN AND HEMATOCRIT, BLOOD
HCT: 26 % — ABNORMAL LOW (ref 39.0–52.0)
Hemoglobin: 8.1 g/dL — ABNORMAL LOW (ref 13.0–17.0)

## 2016-02-07 LAB — MAGNESIUM: Magnesium: 2.9 mg/dL — ABNORMAL HIGH (ref 1.7–2.4)

## 2016-02-07 LAB — APTT: aPTT: 31 seconds (ref 24–36)

## 2016-02-07 MED ORDER — SODIUM CHLORIDE 0.9 % IV SOLN
Freq: Once | INTRAVENOUS | Status: AC
  Administered 2016-02-07: 07:00:00 via INTRAVENOUS

## 2016-02-07 MED ORDER — SODIUM CHLORIDE 0.9 % IV SOLN
0.0000 mg/h | INTRAVENOUS | Status: DC
  Administered 2016-02-07: 8 mg/h via INTRAVENOUS
  Administered 2016-02-08: 2 mg/h via INTRAVENOUS
  Filled 2016-02-07 (×2): qty 20

## 2016-02-07 NOTE — Progress Notes (Signed)
eLink Physician-Brief Progress Note Patient Name: Miguel [name] DOB: July 19, 1950 MRN: [id number]   Date of Service  02/07/2016  HPI/Events of Note  Hgb drop from 7.3 to 6.8  eICU Interventions  Plan: Transfuse 1 unit pRBC Post-transfusion CBC     Intervention Category Intermediate Interventions: Other:  Quintavius Niebuhr 02/07/2016, 5:46 AM

## 2016-02-07 NOTE — Progress Notes (Signed)
CVVHD Management.  Electrolytes stable on current dialysate solutions (all 4K/2.5Ca).  Was off of heparin due to bleeding from trach but now clotting filters and dropping H/H due to losing rinse back.  Will resume heparin and follow. LUE AVF +T/B (faint but present).  Continues on levo and vaso.  Trach without evidence of bleeding.

## 2016-02-07 NOTE — Progress Notes (Signed)
PCCM Admission Note  Admission date: 02/05/2016 Referring provider: Dr. Sherry Ruffing, ER  CC: feels weak  HPI: 65 yo male brought to ER 12/17 with generalized weakness.  Intubated for hypoxia/ hypercarbia & hypothermia 67 .He has CKD 5- biopsy proven FSGS  and CXR showed pulmonary edema.    Events:  12/17 admit, renal consulted 12/18 CRRT 12/19 Severe agitation   Pulled HD cath out.  12/20 seroquel + klonopin added 12/25 worse hypoxia + BL ASD  -needing PEEP/ increase FIO2 12/26 increased levo  Subjective/Interval Hx:  Bleeding from trach improved Has been apneic on PSV Fio2 to 0.40, PEEP to 5   Vital signs: BP 94/66   Pulse 82   Temp 99.5 F (37.5 C) (Oral)   Resp 20   Ht 6\' 1"  (1.854 m)   Wt 120.4 kg (265 lb 6.9 oz)   SpO2 96%   BMI 35.02 kg/m   Intake/output: I/O last 3 completed shifts: In: 3693.3 [I.V.:2699.3; Other:20; NG/GT:660; IV Piggyback:314] Out: 5146 [Urine:10; Other:5136]  General: sedated, NAD on vent, CVVHD  Neuro: RASS -2, on fent gtt + versed gtt HEENT: trach with some oozing around stoma, no pallor, icterus Cardiac: regular, no murmur Chest: resps even non labored on vent, coarse bilaterally, basilar insp crackles  Abd: obese, soft, non tender, decreased bowel sounds Ext: 1+ edema, AVF L arm - bruit  Skin: venous stasis changes   Recent Labs Lab 02/03/16 0457  02/04/16 0315  02/05/16 0322 02/05/16 0323 02/05/16 1704 02/06/16 0400 02/06/16 1940 02/07/16 0515  NA 137  < > 134*  < >  --  135 138 139 139 138  K 5.1  < > 4.6  < >  --  3.9 4.0 3.6 3.3* 4.0  CL 99*  < > 97*  < >  --  99* 103 102 104 102  CO2 26  < > 23  < >  --  23 23 23 23 24   GLUCOSE 126*  < > 187*  < >  --  151* 126* 139* 113* 181*  BUN 47*  < > 51*  < >  --  39* 45* 33* 36* 39*  CREATININE 2.21*  < > 2.28*  < >  --  2.32* 2.86* 2.21* 2.76* 2.66*  CALCIUM 9.8  < > 9.5  < >  --  9.9 9.6 9.7 9.8 9.3  MG 2.9*  --  2.9*  --  2.8*  --   --  2.7*  --  2.9*  PHOS 4.4  < > 5.3*   < >  --  4.4 4.9* 4.0 4.6 5.2*  < > = values in this interval not displayed.   Recent Labs Lab 02/05/16 0322 02/06/16 0400 02/07/16 0516  HGB 7.5* 7.3* 6.8*  HCT 23.8* 23.7* 21.8*  WBC 18.3* 20.3* 20.4*  PLT 261 299 295     ABG    Component Value Date/Time   PHART 7.347 (L) 02/04/2016 0350   PCO2ART 47.1 02/04/2016 0350   PO2ART 66.4 (L) 02/04/2016 0350   HCO3 25.1 02/04/2016 0350   TCO2 30 02/01/2016 1205   ACIDBASEDEF 0.5 01/25/2016 0345   O2SAT 90.6 02/04/2016 0350    CBG (last 3)   Recent Labs  02/07/16 0032 02/07/16 0346 02/07/16 0734  GLUCAP 209* 196* 191*     Imaging: Dg Chest Port 1 View  Result Date: 02/06/2016 CLINICAL DATA:  Acute respiratory failure EXAM: PORTABLE CHEST 1 VIEW COMPARISON:  02/05/2016 FINDINGS: Tracheostomy and right central line remain  in place, unchanged. There is cardiomegaly with vascular congestion, low lung volumes, and bilateral perihilar and lower lobe opacities. Possible small layering effusions. No acute bony abnormality. IMPRESSION: Low lung volumes with stable perihilar and lower lobe opacities. Possible small layering effusions. Electronically Signed   By: Rolm Baptise M.D.   On: 02/06/2016 08:10   Dg Abd Portable 1v  Result Date: 02/06/2016 CLINICAL DATA:  Feeding tube placement. EXAM: PORTABLE ABDOMEN - 1 VIEW COMPARISON:  None. FINDINGS: There is very lateral course of the insert a feeding tube as it approaches the diaphragm and is overlying the lateral upper abdomen. Although the stomach may be very lateral in position, positioning of the tube in the lung cannot be completely excluded based on the film obtained. It may help to pull back the tube and re- advance with additional radiographic assessment. This tube should not be utilized for feeds based on this radiograph. IMPRESSION: Unusual lateral course of the feeding tube. Pulmonary positioning is not excluded. Repositioning recommended with additional radiographic  assessment. Electronically Signed   By: Aletta Edouard M.D.   On: 02/06/2016 13:43    Studies: CT head 12/17 >>neg acute  Echo >> 02/05/2016>> EF 123456, Grade 1 diastolic dysfunction  Cultures: Blood 12/17 >>negative  Sputum 12/17 >>normal flora  resp 12/22 >> normal flora   Antitibiotics: 12/22 zosyn >> 12/29 12/22 vanc >> 12/27   Lines/tubes:  ETT 12/17 >> HD cath R Femoral >> 12/19 Left IJ HD 12/19>>> RIJ 12/26 >> Femoral art line 12/27 >>     ASSESSMENT / PLAN:  Shock of unclear etiology, possibly sepsis, ? Relative adrenal insuff, now on CRRT  PULMONARY Acute hypoxic/hypercapnic respiratory failure in setting of pulmonary edema. ARDS - increased PEEP/FIO2 12/25 ? HCAP  P:   Full vent support PEEP and Fio2 have been weaned, would like to start SBT's  Follow CXR  CARDIOVASCULAR Cardiogenic pulmonary edema  Hx of HTN, HLD. Hypothermia, resolved  EF>> 60-65% ( 12/18), no pericardial efusion Hypotension ?adrenal insuff vs sedation/ vent related 12/26 AF-RVR P:  Continue levophed + vasopressin to maintain MAP > 65 hold home norvasc, lasix, crestor, toprol Continue stress steroids  Amio bolus & gtt started 12/26  RENAL ESRD , now on CRRT, has AVF Overall net 9L negative  P:   F/u chem  Strict I&O Renal following ,CVVHD ongoing  GASTROINTESTINAL Constipation P:   PPI  NPO TF at goal   HEMATOLOGIC Anemia of chronic disease Bleeding from trach site, improved P:  F/u CBC SCD's  Transfuse for HGB >7 Thrombi-pad to trach stoma, heparin on hold  INFECTIOUS HCAP P:   Completed zosyn x 8 days 12/29 Dc'd vanc0 12/27 Pct low reassuring  ENDOCRINE DM   Cortisol=9.8  P:   SSI  Stress dose steroids ordered   NEUROLOGIC Acute metabolic encephalopathy - CT head neg  Agitation  P:   RASS goal 0 to -1 holding outpt neurontin  Daily WUA   clonazepam 1 bid and seroquel 200 BID as ordered, attempt Minimize fentanyl gtt & versed  gtt   Updated daughter 12/27  Independent Cc time 35 minutes.   Baltazar Apo, MD, PhD 02/07/2016, 11:07 AM [facility] [phone] or if no answer [phone]

## 2016-02-08 LAB — RENAL FUNCTION PANEL
ALBUMIN: 2.4 g/dL — AB (ref 3.5–5.0)
ANION GAP: 14 (ref 5–15)
Albumin: 2.3 g/dL — ABNORMAL LOW (ref 3.5–5.0)
Anion gap: 10 (ref 5–15)
BUN: 32 mg/dL — ABNORMAL HIGH (ref 6–20)
BUN: 36 mg/dL — AB (ref 6–20)
CALCIUM: 9.8 mg/dL (ref 8.9–10.3)
CO2: 23 mmol/L (ref 22–32)
CO2: 25 mmol/L (ref 22–32)
CREATININE: 1.82 mg/dL — AB (ref 0.61–1.24)
Calcium: 9.7 mg/dL (ref 8.9–10.3)
Chloride: 100 mmol/L — ABNORMAL LOW (ref 101–111)
Chloride: 101 mmol/L (ref 101–111)
Creatinine, Ser: 1.92 mg/dL — ABNORMAL HIGH (ref 0.61–1.24)
GFR calc Af Amer: 43 mL/min — ABNORMAL LOW (ref >60)
GFR, EST AFRICAN AMERICAN: 40 mL/min — AB (ref >60)
GFR, EST NON AFRICAN AMERICAN: 34 mL/min — AB (ref >60)
GFR, EST NON AFRICAN AMERICAN: 37 mL/min — AB (ref >60)
GLUCOSE: 260 mg/dL — AB (ref 65–99)
Glucose, Bld: 177 mg/dL — ABNORMAL HIGH (ref 65–99)
PHOSPHORUS: 3.8 mg/dL (ref 2.5–4.6)
POTASSIUM: 3.8 mmol/L (ref 3.5–5.1)
Phosphorus: 4.2 mg/dL (ref 2.5–4.6)
Potassium: 4.4 mmol/L (ref 3.5–5.1)
SODIUM: 137 mmol/L (ref 135–145)
Sodium: 136 mmol/L (ref 135–145)

## 2016-02-08 LAB — POCT ACTIVATED CLOTTING TIME
ACTIVATED CLOTTING TIME: 164 s
ACTIVATED CLOTTING TIME: 180 s
ACTIVATED CLOTTING TIME: 180 s
ACTIVATED CLOTTING TIME: 191 s
ACTIVATED CLOTTING TIME: 208 s
ACTIVATED CLOTTING TIME: 219 s
Activated Clotting Time: 180 seconds
Activated Clotting Time: 180 seconds
Activated Clotting Time: 202 seconds
Activated Clotting Time: 219 seconds
Activated Clotting Time: 219 seconds
Activated Clotting Time: 224 seconds
Activated Clotting Time: 219 seconds
Activated Clotting Time: 224 seconds
Activated Clotting Time: 208 seconds
Activated Clotting Time: 202 seconds

## 2016-02-08 LAB — APTT: aPTT: >200 seconds (ref 24–36)

## 2016-02-08 LAB — CBC
HCT: 25.9 % — ABNORMAL LOW (ref 39.0–52.0)
Hemoglobin: 8.3 g/dL — ABNORMAL LOW (ref 13.0–17.0)
MCH: 29.1 pg (ref 26.0–34.0)
MCHC: 32 g/dL (ref 30.0–36.0)
MCV: 90.9 fL (ref 78.0–100.0)
Platelets: 305 10*3/uL (ref 150–400)
RBC: 2.85 MIL/uL — AB (ref 4.22–5.81)
RDW: 19.7 % — AB (ref 11.5–15.5)
WBC: 26.6 10*3/uL — AB (ref 4.0–10.5)

## 2016-02-08 LAB — TYPE AND SCREEN
BLOOD PRODUCT EXPIRATION DATE: 201801012359
ISSUE DATE / TIME: 201712310844
Unit Type and Rh: 6200

## 2016-02-08 LAB — GLUCOSE, CAPILLARY
GLUCOSE-CAPILLARY: 193 mg/dL — AB (ref 65–99)
GLUCOSE-CAPILLARY: 189 mg/dL — AB (ref 65–99)
Glucose-Capillary: 210 mg/dL — ABNORMAL HIGH (ref 65–99)
Glucose-Capillary: 224 mg/dL — ABNORMAL HIGH (ref 65–99)
Glucose-Capillary: 169 mg/dL — ABNORMAL HIGH (ref 65–99)

## 2016-02-08 LAB — MAGNESIUM: MAGNESIUM: 2.1 mg/dL (ref 1.7–2.4)

## 2016-02-08 NOTE — Progress Notes (Signed)
Patient ID: Miguel [name], male   DOB: 1950/05/06, 66 y.o.   MRN: [id number] S:No bleeding from trach overnight since restarting heparin with CVVHD O:BP 109/75   Pulse 75   Temp (!) 96.2 F (35.7 C) (Oral)   Resp 20   Ht _0  (1.854 m)   Wt 127 kg (280 lb)   SpO2 93%   BMI 36.94 kg/m   Intake/Output Summary (Last 24 hours) at 02/08/16 0929 Last data filed at 02/08/16 0900  Gross per 24 hour  Intake          2671.78 ml  Output             3901 ml  Net         -1229.22 ml   Intake/Output: I/O last 3 completed shifts: In: 5228.9 [I.V.:2519.9; Blood:335; NG/GT:2060; IV Piggyback:314] Out: 3903 [Urine:10; ESPQZ:3007]  Intake/Output this shift:  Total I/O In: -  Out: 296 [Other:296] Weight change: 6.607 kg (14 lb 9.1 oz) MAU:QJFHL, s/p trach CVS:no rub Resp:cta KTG:YBWLSL Ext:no edema, LUE AVF pulsatile, no thrill or bruit   Recent Labs Lab 02/05/16 0323 02/05/16 1704 02/06/16 0400 02/06/16 1940 02/07/16 0515 02/07/16 1600 02/08/16 0500  NA 135 138 139 139 138 137 137  K 3.9 4.0 3.6 3.3* 4.0 3.9 3.8  CL 99* 103 102 104 102 101 100*  CO2 _1 21* 23  GLUCOSE 151* 126* 139* 113* 181* 197* 177*  BUN 39* 45* 33* 36* 39* 35* 32*  CREATININE 2.32* 2.86* 2.21* 2.76* 2.66* 2.07* 1.92*  ALBUMIN 2.4* 2.4* 2.4* 2.5* 2.3* 2.4* 2.4*  CALCIUM 9.9 9.6 9.7 9.8 9.3 9.2 9.8  PHOS 4.4 4.9* 4.0 4.6 5.2* 4.4 3.8   Liver Function Tests:  Recent Labs Lab 02/07/16 0515 02/07/16 1600 02/08/16 0500  ALBUMIN 2.3* 2.4* 2.4*   No results for input(s): LIPASE, AMYLASE in the last 168 hours. No results for input(s): AMMONIA in the last 168 hours. CBC:  Recent Labs Lab 02/04/16 0315 02/05/16 0322 02/06/16 0400 02/07/16 0516 02/07/16 1300 02/08/16 0500  WBC 20.2* 18.3* 20.3* 20.4*  --  26.6*  HGB 7.8* 7.5* 7.3* 6.8* 8.1* 8.3*  HCT 25.6* 23.8* 23.7* 21.8* 26.0* 25.9*  MCV 90.8 90.8 90.8 92.8  --  90.9  PLT 290 261 299 295  --  305   Cardiac Enzymes: No results  for input(s): CKTOTAL, CKMB, CKMBINDEX, TROPONINI in the last 168 hours. CBG:  Recent Labs Lab 02/07/16 1641 02/07/16 1938 02/07/16 2347 02/08/16 0357 02/08/16 0843  GLUCAP 176* 180* 175* 193* 210*    Iron Studies: No results for input(s): IRON, TIBC, TRANSFERRIN, FERRITIN in the last 72 hours. Studies/Results: Dg Abd Portable 1v  Result Date: 02/06/2016 CLINICAL DATA:  Feeding tube placement. EXAM: PORTABLE ABDOMEN - 1 VIEW COMPARISON:  None. FINDINGS: There is very lateral course of the insert a feeding tube as it approaches the diaphragm and is overlying the lateral upper abdomen. Although the stomach may be very lateral in position, positioning of the tube in the lung cannot be completely excluded based on the film obtained. It may help to pull back the tube and re- advance with additional radiographic assessment. This tube should not be utilized for feeds based on this radiograph. IMPRESSION: Unusual lateral course of the feeding tube. Pulmonary positioning is not excluded. Repositioning recommended with additional radiographic assessment. Electronically Signed   By: Aletta Edouard M.D.   On: 02/06/2016 13:43   . chlorhexidine gluconate (MEDLINE KIT)  15 mL Mouth Rinse BID  . clonazePAM  0.5 mg Per Tube BID  . famotidine (PEPCID) IV  20 mg Intravenous Q24H  . feeding supplement (PRO-STAT SUGAR FREE 64)  30 mL Per Tube QID  . hydrocortisone sod succinate (SOLU-CORTEF) inj  50 mg Intravenous Q6H  . insulin aspart  0-20 Units Subcutaneous Q4H  . mouth rinse  15 mL Mouth Rinse QID  . QUEtiapine  200 mg Oral BID    BMET    Component Value Date/Time   NA 137 02/08/2016 0500   K 3.8 02/08/2016 0500   CL 100 (L) 02/08/2016 0500   CO2 23 02/08/2016 0500   GLUCOSE 177 (H) 02/08/2016 0500   BUN 32 (H) 02/08/2016 0500   CREATININE 1.92 (H) 02/08/2016 0500   CALCIUM 9.8 02/08/2016 0500   GFRNONAA 34 (L) 02/08/2016 0500   GFRAA 40 (L) 02/08/2016 0500   CBC    Component Value  Date/Time   WBC 26.6 (H) 02/08/2016 0500   RBC 2.85 (L) 02/08/2016 0500   HGB 8.3 (L) 02/08/2016 0500   HCT 25.9 (L) 02/08/2016 0500   HCT 36.3 (L) 08/24/2015 2045   PLT 305 02/08/2016 0500   MCV 90.9 02/08/2016 0500   MCH 29.1 02/08/2016 0500   MCHC 32.0 02/08/2016 0500   RDW 19.7 (H) 02/08/2016 0500   LYMPHSABS 1.1 02/03/2016 1056   MONOABS 0.4 01/17/2016 1056   EOSABS 0.1 01/11/2016 1056   BASOSABS 0.0 02/01/2016 1056     Assessment/Plan:  1. AKI/CKD stage 5 admitted 01/23/2016 with severe sepsis syndrome, hypotension, severe acidemia, and VDRF.  He has been on CVVHD since admission without significant improvement.    2. CVVHD Management.  Electrolytes stable on current dialysate solutions (all 4K/2.5Ca).  Was off of heparin due to bleeding from trach but started clotting filters and dropping H/H due to losing rinse back.  Resumed heparin 02/07/16 and doing well. 3. Vascular access- LUE AVF now pulsatile and without T/B (likely clotted) 4. SIRS.  no infectious etiology detected, completed course of zosyn.  Continues on levo and vaso.   5. VDRF- s/p trach 02/04/16 Trach without evidence of bleeding.  6. Disposition- patient has not shown any signs of improvement.  Also has grim outlook with chronic vent facility with ESRD.  Will consult Palliative care to help set goals/limits of care and initiate discussions regarding EOL and possible transition to comfort measures. Donetta Potts, MD [employer] [phone]

## 2016-02-08 NOTE — Progress Notes (Signed)
PCCM Admission Note  Admission date: 01/27/2016 Referring provider: Dr. Sherry Ruffing, ER  CC: feels weak  HPI: 66 yo male brought to ER 12/17 with generalized weakness.  Intubated for hypoxia/ hypercarbia & hypothermia 89 .He has CKD 5- biopsy proven FSGS  and CXR showed pulmonary edema.    Events:  12/17 admit, renal consulted 12/18 CRRT 12/19 Severe agitation   Pulled HD cath out.  12/20 seroquel + klonopin added 12/25 worse hypoxia + BL ASD  -needing PEEP/ increase FIO2 12/26 increased levo  Subjective/Interval Hx:  Continues to require pressors, CVVHD No clear improvement in MS, requiring sedation.    Vital signs: BP 109/75   Pulse 75   Temp (!) 96 F (35.6 C) (Axillary)   Resp 20   Ht 6\' 1"  (1.854 m)   Wt 127 kg (280 lb)   SpO2 96%   BMI 36.94 kg/m   Intake/output: I/O last 3 completed shifts: In: 5228.9 [I.V.:2519.9; Blood:335; NG/GT:2060; IV Piggyback:314] Out: [id number] [Urine:10; [id number]  General: sedated, NAD on vent, CVVHD  Neuro: RASS -2, on fent gtt + versed gtt HEENT: trach with some oozing around stoma, no pallor, icterus Cardiac: regular, no murmur Chest: resps even non labored on vent, coarse bilaterally, basilar insp crackles  Abd: obese, soft, non tender, decreased bowel sounds Ext: 1+ edema, AVF L arm - bruit  Skin: venous stasis changes   Recent Labs Lab 02/04/16 0315  02/05/16 0322  02/06/16 0400 02/06/16 1940 02/07/16 0515 02/07/16 1600 02/08/16 0500  NA 134*  < >  --   < > 139 139 138 137 137  K 4.6  < >  --   < > 3.6 3.3* 4.0 3.9 3.8  CL 97*  < >  --   < > 102 104 102 101 100*  CO2 23  < >  --   < > 23 23 24  21* 23  GLUCOSE 187*  < >  --   < > 139* 113* 181* 197* 177*  BUN 51*  < >  --   < > 33* 36* 39* 35* 32*  CREATININE 2.28*  < >  --   < > 2.21* 2.76* 2.66* 2.07* 1.92*  CALCIUM 9.5  < >  --   < > 9.7 9.8 9.3 9.2 9.8  MG 2.9*  --  2.8*  --  2.7*  --  2.9*  --  2.1  PHOS 5.3*  < >  --   < > 4.0 4.6 5.2* 4.4 3.8  < > = values in  this interval not displayed.   Recent Labs Lab 02/06/16 0400 02/07/16 0516 02/07/16 1300 02/08/16 0500  HGB 7.3* 6.8* 8.1* 8.3*  HCT 23.7* 21.8* 26.0* 25.9*  WBC 20.3* 20.4*  --  26.6*  PLT 299 295  --  305     ABG    Component Value Date/Time   PHART 7.347 (L) 02/04/2016 0350   PCO2ART 47.1 02/04/2016 0350   PO2ART 66.4 (L) 02/04/2016 0350   HCO3 25.1 02/04/2016 0350   TCO2 30 02/01/2016 1205   ACIDBASEDEF 0.5 01/25/2016 0345   O2SAT 90.6 02/04/2016 0350    CBG (last 3)   Recent Labs  02/08/16 0357 02/08/16 0843 02/08/16 1157  GLUCAP 193* 210* 189*     Imaging: Dg Abd Portable 1v  Result Date: 02/06/2016 CLINICAL DATA:  Feeding tube placement. EXAM: PORTABLE ABDOMEN - 1 VIEW COMPARISON:  None. FINDINGS: There is very lateral course of the insert a feeding tube as it  approaches the diaphragm and is overlying the lateral upper abdomen. Although the stomach may be very lateral in position, positioning of the tube in the lung cannot be completely excluded based on the film obtained. It may help to pull back the tube and re- advance with additional radiographic assessment. This tube should not be utilized for feeds based on this radiograph. IMPRESSION: Unusual lateral course of the feeding tube. Pulmonary positioning is not excluded. Repositioning recommended with additional radiographic assessment. Electronically Signed   By: Aletta Edouard M.D.   On: 02/06/2016 13:43    Studies: CT head 12/17 >>neg acute  Echo >> 01/08/2016>> EF 123456, Grade 1 diastolic dysfunction  Cultures: Blood 12/17 >>negative  Sputum 12/17 >>normal flora  resp 12/22 >> normal flora   Antitibiotics: 12/22 zosyn >> 12/29 12/22 vanc >> 12/27   Lines/tubes:  ETT 12/17 >> HD cath R Femoral >> 12/19 Left IJ HD 12/19>>> RIJ 12/26 >> Femoral art line 12/27 >>     ASSESSMENT / PLAN:  Shock of unclear etiology, possibly sepsis, ? Relative adrenal insuff, now on  CRRT  PULMONARY Acute hypoxic/hypercapnic respiratory failure in setting of pulmonary edema. ARDS - increased PEEP/FIO2 12/25 ? HCAP  P:   Full vent support PEEP and Fio2 have been weaned, would like to start SBT's but quite debilitated.  Follow CXR  CARDIOVASCULAR Cardiogenic pulmonary edema  Hx of HTN, HLD. Hypothermia, resolved  EF>> 60-65% ( 12/18), no pericardial efusion Hypotension ?adrenal insuff vs sedation/ vent related 12/26 AF-RVR P:  Continue levophed + vasopressin to maintain MAP > 65 hold home norvasc, lasix, crestor, toprol Continue stress steroids  Amio bolus & gtt started 12/26  RENAL ESRD , now on CRRT, has AVF Overall net 9L negative  P:   F/u chem  Strict I&O Renal following ,CVVHD ongoing, will ultimately be HD dependent  GASTROINTESTINAL Constipation P:   PPI  NPO TF at goal   HEMATOLOGIC Anemia of chronic disease Bleeding from trach site, improved P:  F/u CBC SCD's  Transfuse for HGB >7 Thrombi-pad to trach stoma, heparin on hold  INFECTIOUS HCAP P:   Completed zosyn x 8 days 12/29 Dc'd vanc0 12/27 Pct low reassuring  ENDOCRINE DM   Cortisol=9.8  P:   SSI  Stress dose steroids ordered   NEUROLOGIC Acute metabolic encephalopathy - CT head neg  Agitation  P:   RASS goal 0 to -1 holding outpt neurontin  Daily WUA   clonazepam 1 bid and seroquel 200 BID as ordered, attempt Minimize fentanyl gtt & versed gtt   Updated daughter 12/27  Goals of Care: no significant improvement over the last week. Unclear to me that there is a chance to recover significantly. Also unclear what reversible causes are present. I agree with Palliative care consultation and family meeting, planned for 1/2.   Independent Cc time 35 minutes.   Baltazar Apo, MD, PhD 02/08/2016, 12:57 PM [facility] [phone] or if no answer [phone]

## 2016-02-08 DEATH — deceased

## 2016-02-09 DIAGNOSIS — J9611 Chronic respiratory failure with hypoxia: Secondary | ICD-10-CM

## 2016-02-09 DIAGNOSIS — Z66 Do not resuscitate: Secondary | ICD-10-CM

## 2016-02-09 DIAGNOSIS — Z515 Encounter for palliative care: Secondary | ICD-10-CM

## 2016-02-09 DIAGNOSIS — R4182 Altered mental status, unspecified: Secondary | ICD-10-CM

## 2016-02-09 DIAGNOSIS — R0600 Dyspnea, unspecified: Secondary | ICD-10-CM

## 2016-02-09 LAB — CBC
HEMATOCRIT: 27.1 % — AB (ref 39.0–52.0)
HEMOGLOBIN: 8.4 g/dL — AB (ref 13.0–17.0)
MCH: 28.6 pg (ref 26.0–34.0)
MCHC: 31 g/dL (ref 30.0–36.0)
MCV: 92.2 fL (ref 78.0–100.0)
Platelets: 279 10*3/uL (ref 150–400)
RBC: 2.94 MIL/uL — ABNORMAL LOW (ref 4.22–5.81)
RDW: 20.1 % — ABNORMAL HIGH (ref 11.5–15.5)
WBC: 25.5 10*3/uL — ABNORMAL HIGH (ref 4.0–10.5)

## 2016-02-09 LAB — GLUCOSE, CAPILLARY
GLUCOSE-CAPILLARY: 155 mg/dL — AB (ref 65–99)
GLUCOSE-CAPILLARY: 188 mg/dL — AB (ref 65–99)
GLUCOSE-CAPILLARY: 175 mg/dL — AB (ref 65–99)
Glucose-Capillary: 169 mg/dL — ABNORMAL HIGH (ref 65–99)

## 2016-02-09 LAB — RENAL FUNCTION PANEL
ALBUMIN: 2.7 g/dL — AB (ref 3.5–5.0)
ANION GAP: 12 (ref 5–15)
BUN: 31 mg/dL — ABNORMAL HIGH (ref 6–20)
CHLORIDE: 99 mmol/L — AB (ref 101–111)
CO2: 25 mmol/L (ref 22–32)
Calcium: 9.9 mg/dL (ref 8.9–10.3)
Creatinine, Ser: 1.64 mg/dL — ABNORMAL HIGH (ref 0.61–1.24)
GFR calc Af Amer: 48 mL/min — ABNORMAL LOW (ref >60)
GFR calc non Af Amer: 42 mL/min — ABNORMAL LOW (ref >60)
GLUCOSE: 174 mg/dL — AB (ref 65–99)
PHOSPHORUS: 2.9 mg/dL (ref 2.5–4.6)
POTASSIUM: 4 mmol/L (ref 3.5–5.1)
Sodium: 136 mmol/L (ref 135–145)

## 2016-02-09 LAB — APTT

## 2016-02-09 LAB — POCT ACTIVATED CLOTTING TIME
ACTIVATED CLOTTING TIME: 208 s
ACTIVATED CLOTTING TIME: 213 s
ACTIVATED CLOTTING TIME: 219 s
Activated Clotting Time: 197 seconds
Activated Clotting Time: 224 seconds
Activated Clotting Time: 230 seconds
Activated Clotting Time: 213 seconds

## 2016-02-09 LAB — MAGNESIUM: Magnesium: 2.7 mg/dL — ABNORMAL HIGH (ref 1.7–2.4)

## 2016-02-09 MED ORDER — GLYCOPYRROLATE 0.2 MG/ML IJ SOLN
0.4000 mg | Freq: Three times a day (TID) | INTRAMUSCULAR | Status: DC
  Administered 2016-02-09: 0.4 mg via INTRAVENOUS
  Filled 2016-02-09 (×2): qty 2

## 2016-02-09 MED ORDER — FENTANYL BOLUS VIA INFUSION
50.0000 ug | INTRAVENOUS | Status: DC | PRN
  Filled 2016-02-09: qty 200

## 2016-02-09 MED ORDER — SODIUM CHLORIDE 0.9 % IV SOLN
10.0000 mg/h | INTRAVENOUS | Status: DC
  Filled 2016-02-09: qty 10

## 2016-02-09 MED ORDER — MIDAZOLAM BOLUS VIA INFUSION (WITHDRAWAL LIFE SUSTAINING TX)
5.0000 mg | INTRAVENOUS | Status: DC | PRN
  Filled 2016-02-09: qty 20

## 2016-02-12 ENCOUNTER — Telehealth: Payer: Self-pay

## 2016-02-12 NOTE — Telephone Encounter (Signed)
On 02/12/16 I received a death certificate from [employer] (original). The death certificate is for cremation. The patient is a patient of Doctor Byrum. The death certificate will be taken to [facility] Monday (02/12/16) for signature.  On 03/13/16 I received the death certificate back from Doctor Byrum. I got the death certificate ready and called the funeral home to let them know the death certificate is ready for pickup. I also faxed a copy to the funeral home per the funeral home request.

## 2016-03-04 LAB — FUNGUS CULTURE WITH STAIN

## 2016-03-04 LAB — FUNGUS CULTURE RESULT

## 2016-03-04 LAB — FUNGAL ORGANISM REFLEX

## 2016-03-10 NOTE — Progress Notes (Signed)
   03-10-16 1300  Clinical Encounter Type  Visited With Patient and family together;Health care provider  Visit Type Initial;Spiritual support  Referral From Palliative care team  Consult/Referral To Chaplain  Spiritual Encounters  Spiritual Needs Emotional  Stress Factors  Patient Stress Factors None identified  Family Stress Factors Health changes    Chaplain responded to page from palliative care team. Pt being removed from life support and family is at bedside. Chaplain present to two patients on unit. Chaplain offered ministry of presence and prayer.

## 2016-03-10 NOTE — Progress Notes (Signed)
PCCM Admission Note  Admission date: 01/30/2016 Referring provider: Dr. Sherry Ruffing, ER  CC: feels weak  HPI: 66 yo male brought to ER 12/17 with generalized weakness.  Intubated for hypoxia/ hypercarbia & hypothermia 89 .He has CKD 5- biopsy proven FSGS  and CXR showed pulmonary edema.    Events:  12/17 admit, renal consulted 12/18 CRRT 12/19 Severe agitation   Pulled HD cath out.  12/20 seroquel + klonopin added 12/25 worse hypoxia + BL ASD  -needing PEEP/ increase FIO2 12/26 increased levo  Subjective/Interval Hx:  Palliative care meeting taking place today No significant clinical change or improvement   Vital signs: BP 98/67   Pulse 79   Temp 97 F (36.1 C) (Oral)   Resp 20   Ht 6\' 1"  (1.854 m)   Wt 126.8 kg (279 lb 8.7 oz)   SpO2 94%   BMI 36.88 kg/m   Intake/output: I/O last 3 completed shifts: In: 2968.6 [I.V.:2428.6; NG/GT:490; IV Piggyback:50] Out: [id number] [Other:5047]  General: sedated, NAD on vent, CVVHD  Neuro: RASS -2, on fent gtt + versed gtt HEENT: trach with some oozing around stoma, no pallor, icterus Cardiac: regular, no murmur Chest: resps even non labored on vent, coarse bilaterally, basilar insp crackles  Abd: obese, soft, non tender, decreased bowel sounds Ext: 1+ edema, AVF L arm - bruit  Skin: venous stasis changes   Recent Labs Lab 02/05/16 0322  02/06/16 0400  02/07/16 0515 02/07/16 1600 02/08/16 0500 02/08/16 1630 2016-02-22 0457  NA  --   < > 139  < > 138 137 137 136 136  K  --   < > 3.6  < > 4.0 3.9 3.8 4.4 4.0  CL  --   < > 102  < > 102 101 100* 101 99*  CO2  --   < > 23  < > 24 21* 23 25 25   GLUCOSE  --   < > 139*  < > 181* 197* 177* 260* 174*  BUN  --   < > 33*  < > 39* 35* 32* 36* 31*  CREATININE  --   < > 2.21*  < > 2.66* 2.07* 1.92* 1.82* 1.64*  CALCIUM  --   < > 9.7  < > 9.3 9.2 9.8 9.7 9.9  MG 2.8*  --  2.7*  --  2.9*  --  2.1  --  2.7*  PHOS  --   < > 4.0  < > 5.2* 4.4 3.8 4.2 2.9  < > = values in this interval not  displayed.   Recent Labs Lab 02/07/16 0516 02/07/16 1300 02/08/16 0500 02/22/2016 0457  HGB 6.8* 8.1* 8.3* 8.4*  HCT 21.8* 26.0* 25.9* 27.1*  WBC 20.4*  --  26.6* 25.5*  PLT 295  --  305 279     ABG    Component Value Date/Time   PHART 7.347 (L) 02/04/2016 0350   PCO2ART 47.1 02/04/2016 0350   PO2ART 66.4 (L) 02/04/2016 0350   HCO3 25.1 02/04/2016 0350   TCO2 30 02/01/2016 1205   ACIDBASEDEF 0.5 01/25/2016 0345   O2SAT 90.6 02/04/2016 0350    CBG (last 3)   Recent Labs  02/22/2016 0418 02/22/2016 0843 22-Feb-2016 1157  GLUCAP 155* 188* 175*     Imaging: No results found.  Studies: CT head 12/17 >>neg acute  Echo >> 02/02/2016>> EF 123456, Grade 1 diastolic dysfunction  Cultures: Blood 12/17 >>negative  Sputum 12/17 >>normal flora  resp 12/22 >> normal flora   Antitibiotics:  12/22 zosyn >> 12/29 12/22 vanc >> 12/27   Lines/tubes:  ETT 12/17 >> HD cath R Femoral >> 12/19 Left IJ HD 12/19>>> RIJ 12/26 >> Femoral art line 12/27 >>     ASSESSMENT / PLAN:  Shock of unclear etiology, possibly sepsis, ? Relative adrenal insuff, now on CRRT  PULMONARY Acute hypoxic/hypercapnic respiratory failure in setting of pulmonary edema. ARDS - increased PEEP/FIO2 12/25 ? HCAP  P:   Full vent support PEEP and Fio2 unchanged Follow CXR  CARDIOVASCULAR Cardiogenic pulmonary edema  Hx of HTN, HLD. Hypothermia, resolved  EF>> 60-65% ( 12/18), no pericardial efusion Hypotension ?adrenal insuff vs sedation/ vent related 12/26 AF-RVR P:  Continue levophed + vasopressin to maintain MAP > 65 hold home norvasc, lasix, crestor, toprol Continue stress steroids  Amio bolus & gtt started 12/26  RENAL ESRD , now on CRRT, has AVF Overall net 9L negative  P:   F/u chem  Strict I&O Renal following ,CVVHD ongoing, would ultimately be HD dependent  GASTROINTESTINAL Constipation P:   PPI  NPO TF at goal   HEMATOLOGIC Anemia of chronic disease Bleeding from  trach site, improved P:  F/u CBC SCD's  Transfuse for HGB >7 Thrombi-pad to trach stoma, heparin on hold  INFECTIOUS HCAP P:   Completed zosyn x 8 days 12/29 Dc'd vanc0 12/27  ENDOCRINE DM   Cortisol=9.8  P:   SSI  Stress dose steroids ordered   NEUROLOGIC Acute metabolic encephalopathy - CT head neg  Agitation  P:   RASS goal 0 to -1 holding outpt neurontin  Daily WUA   clonazepam 1 bid and seroquel 200 BID as ordered, attempt Minimize fentanyl gtt & versed gtt    Goals of Care:  [place] input. I was informed after meeting this am that family understands poor prognosis, is ready to pursue d/c from CVVHD and then comfort care. Based on his lack of progress I agree with this plan. Anticipate withdrawal of care today.     Baltazar Apo, MD, PhD Feb 11, 2016, 2:19 PM [facility] [phone] or if no answer [phone]

## 2016-03-10 NOTE — Care Management Note (Signed)
Case Management Note  Patient Details  Name: Miguel [name] MRN: [id number] Date of Birth: 06-25-1950  Pt admitted with acute on chronic resp distress                    Action/Plan:  No family at bedside.  Pt is on the ventilator and CRRT.  CM will continue to follow for discharge needs   Expected Discharge Date:   (unknown)               Expected Discharge Plan:     In-House Referral:     Discharge planning Services  CM Consult  Post Acute Care Choice:    Choice offered to:     DME Arranged:    DME Agency:     HH Arranged:    HH Agency:     Status of Service:  In process, will continue to follow  If discussed at Long Length of Stay Meetings, dates discussed:    Additional Comments: 03/02/2016  Discussed in LOS 2016-03-02 - pt remains appropriate for continued stay.  Pt transitioned to full comfort care today.  02/05/16 Elenor Quinones, RN, BSN [phone] Pt trached yesterday - attending anticipates that pt will wean quickly.  Pt remains on CRRT.   CM will continue to follow for discharge needs.  Maryclare Labrador, RN March 02, 2016, 3:43 PM

## 2016-03-10 NOTE — Progress Notes (Signed)
Pt previously on Fentanyl drip (10 ,cg/ml); 175 ml of 250 ml bag wasted in sink; witnessed by Erin Fulling, RN.  Lenor Coffin, RN

## 2016-03-10 NOTE — Progress Notes (Signed)
Pt asystolic in EKG leads x 2; no apical heart tones or spontaneous respirations noted; pupils fixed and dilated; finding verified with Johnnette Litter, RN. Pt pronounced at 1710.  Dr Lamonte Sakai notified. Family at bedside.  Lenor Coffin, RN

## 2016-03-10 NOTE — Consult Note (Signed)
Consultation Note Date: 03-07-16   Patient Name: Miguel [name]  DOB: 10-12-1950  MRN: [id number]  Age / Sex: 66 y.o., male  PCP: Velna Hatchet, MD Referring Physician: Raylene Miyamoto, MD  Reason for Consultation: Establishing goals of care and Psychosocial/spiritual support  HPI/Patient Profile: 66 y.o. male   admitted on 01/28/2016 thru ER with generalized weakness.  He had SpO2 78%.  ABG showed hypercapnia.  He was tried on Bipap w/o significant improvement.  He has CKD 5 and CXR showed pulmonary edema.  He had temperature 66F.  He was intubated in ER.  According to family patient was assumed to be going to dialysis and managing his DM, but are now realizing he was not  Events:  12/17 admit, renal consulted 12/18 CRRT 12/19 Severe agitation   Pulled HD cath out.  12/20 seroquel + klonopin added 12/25 worse hypoxia + BL ASD  -needing PEEP/ increase FIO2 12/26 increased levo   Continues to require pressors, CVVHD, No clear improvement in MS, requiring sedation.   Family face limitations of medical interventions, EOL decisions as it relates to de-escalation of life prolonging interventions   Clinical Assessment and Goals of Care:  This NP Wadie Lessen reviewed medical records, received report from team, assessed the patient and then meet at the patient's bedside along with his daugter Miguel [name], brother Miguel [name], nephew Miguel [name]  to discuss diagnosis, prognosis, GOC, EOL wishes disposition and options.  Miguel [name], Miguel [name] active participant in [employer].   A detailed discussion was had today regarding advanced directives.  Concepts specific to code status, artifical feeding and hydration, continued IV antibiotics and rehospitalization was had.  The difference between a aggressive medical intervention path  and a palliative comfort care path for this patient at this time was had.  Values and goals  of care important to patient and family were attempted to be elicited.  Natural trajectory and expectations at EOL were discussed.  Questions and concerns addressed.   Family encouraged to call with questions or concerns.  PMT will continue to support holistically.  NEXT OF KIN/daughter-Miguel [name]    SUMMARY OF RECOMMENDATIONS    Focus of care is comfort and dignity.  De-escalate life prolonging interventions and allow a natural death.  All family members present support Miguel [name] # [phone] in her decisions for her father's EOL care.  Code Status/Advance Care Planning:   DNR   One way wean  from the ventilator    Symptom management   Chaplain for emotional support    Symptom Management:   Pain/Dyspnea: Fentanyl gtt/ with boluses  Agitation: Versed gtt/ with boluses  Terminal secretions Rubinol 0.4 mcg IV tid  Palliative Prophylaxis:   Aspiration, Frequent Pain Assessment and Oral Care  Additional Recommendations (Limitations, Scope, Preferences):  Full Comfort Care  Psycho-social/Spiritual:   Desire for further Chaplaincy support:yes  Additional Recommendations: Grief/Bereavement Support  Prognosis:   Hours - Days  Discharge Planning: Anticipated [name] Death      Primary Diagnoses: Present on Admission: . Acute respiratory failure ([place])  I have reviewed the medical record, interviewed the patient and family, and examined the patient. The following aspects are pertinent.  Past Medical History:  Diagnosis Date  . Arthritis   . CKD stage 5 secondary to hypertension ([place])   . Diabetes mellitus without complication ([place])    no rx  . Dyslipidemia   . Essential hypertension   . GERD (gastroesophageal reflux disease)   . Paroxysmal atrial fibrillation [facility])    Social History   Social History  . Marital status: Single    Spouse name: N/A  . Number of children: N/A  . Years of education: N/A   Social History Main Topics  . Smoking status:  Former Smoker    Packs/day: 0.25    Years: 30.00    Types: Cigarettes    Quit date: 02/08/2007  . Smokeless tobacco: Never Used     Comment: 09 last alcohol  . Alcohol use No  . 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 Sexual activity: Not Asked   Other Topics Concern  . None   Social History Narrative  . None   Family History  Problem Relation Age of Onset  . Deep vein thrombosis Mother   . Hypertension Mother   . Clotting disorder Mother   . Peripheral vascular disease Brother    Scheduled Meds: . chlorhexidine gluconate (MEDLINE KIT)  15 mL Mouth Rinse BID  . clonazePAM  0.5 mg Per Tube BID  . famotidine (PEPCID) IV  20 mg Intravenous Q24H  . feeding supplement (PRO-STAT SUGAR FREE 64)  30 mL Per Tube QID  . hydrocortisone sod succinate (SOLU-CORTEF) inj  50 mg Intravenous Q6H  . insulin aspart  0-20 Units Subcutaneous Q4H  . mouth rinse  15 mL Mouth Rinse QID  . QUEtiapine  200 mg Oral BID   Continuous Infusions: . sodium chloride 10 mL/hr at 02/06/16 1025  . sodium chloride 10 mL/hr at 2016-03-05 0700  . feeding supplement (VITAL HIGH PROTEIN) 1,000 mL (02/07/16 1458)  . fentaNYL infusion INTRAVENOUS 300 mcg/hr (03-05-16 0830)  . heparin 10,000 units/ 20 mL infusion syringe 1,350 Units/hr (2016-03-05 0802)  . midazolam (VERSED) infusion 2 mg/hr (March 05, 2016 0915)  . norepinephrine (LEVOPHED) Adult infusion 14 mcg/min (March 05, 2016 0800)  . dialysis replacement fluid (prismasate) 2,000 mL/hr at 2016-03-05 0852  . dialysis replacement fluid (prismasate) 400 mL/hr at 03/05/16 0215  . dialysis replacement fluid (prismasate) 200 mL/hr at 03/05/2016 0215  . vasopressin (PITRESSIN) infusion - *FOR SHOCK* 0.03 Units/min (03/05/16 0700)   PRN Meds:.alteplase, bisacodyl, fentaNYL, fentaNYL (SUBLIMAZE) injection, heparin, heparin, midazolam, sodium chloride Medications Prior to Admission:  Prior to Admission medications   Medication Sig  Start Date End Date Taking? Authorizing Provider  allopurinol (ZYLOPRIM) 100 MG tablet Take 100 mg by mouth daily.    Yes Historical Provider, MD  amLODipine (NORVASC) 10 MG tablet Take 10 mg by mouth daily.   Yes Historical Provider, MD  calcitRIOL (ROCALTROL) 0.25 MCG capsule Take 0.25-0.5 mcg by mouth See admin instructions. Takes 0.29mg on even days, and 0.255m on odd days   Yes Historical Provider, MD  cinacalcet (SENSIPAR) 30 MG tablet Take 30 mg by mouth 2 (two) times daily.   Yes Historical Provider, MD  gabapentin (NEURONTIN) 100 MG capsule Take 100 mg by mouth 2 (two) times daily.   Yes Historical Provider, MD  KILayla Barter5 GM/60ML suspension Take 7.5 mg by mouth daily.  01/17/16  Yes Historical Provider, MD  metoprolol succinate (TOPROL-XL) 100 MG 24 hr tablet Take 100 mg by mouth 2 (two) times daily. Take with or immediately following a meal.   Yes Historical Provider, MD  rosuvastatin (CRESTOR) 20 MG tablet Take 20 mg by mouth at bedtime.   Yes Historical Provider, MD  sevelamer carbonate (RENVELA) 800 MG tablet Take 2 tablets (1,600 mg total) by mouth 3 (three) times daily with meals. Patient taking differently: Take 800 mg by mouth 3 (three) times daily with meals.  08/26/15  Yes Theodis Blaze, MD  sodium bicarbonate 650 MG tablet Take 650 mg by mouth 3 (three) times daily.   Yes Historical Provider, MD  tamsulosin (FLOMAX) 0.4 MG CAPS capsule Take 0.4 mg by mouth at bedtime.   Yes Historical Provider, MD  furosemide (LASIX) 80 MG tablet Take 1 tablet (80 mg total) by mouth daily. Patient not taking: Reported on 01/25/2016 08/26/15   Theodis Blaze, MD  furosemide (LASIX) 80 MG tablet Take 2 tablets (160 mg total) by mouth every evening. Patient not taking: Reported on 01/25/2016 08/27/15   Theodis Blaze, MD  NITROSTAT 0.4 MG SL tablet Place 0.4 mg under the tongue every 5 (five) minutes as needed for chest pain.  12/20/12   Historical Provider, MD  patiromer (VELTASSA) 8.4 g packet Take 1  packet (8.4 g total) by mouth daily. Patient not taking: Reported on 01/25/2016 08/26/15   Theodis Blaze, MD  polycarbophil (FIBERCON) 625 MG tablet Take 2 tablets (1,250 mg total) by mouth daily. Patient not taking: Reported on 01/25/2016 08/29/15   Gloriann Loan, PA-C   No Known Allergies Review of Systems  Unable to perform ROS: Intubated    Physical Exam  Constitutional: He appears ill. He is sedated, intubated and restrained.  HENT:  Mouth/Throat: Oropharynx is clear and moist.  Cardiovascular: Tachycardia present.   Pulmonary/Chest: He is intubated.  Skin: Skin is warm and dry.    Vital Signs: BP (!) 65/38   Pulse 95   Temp 97.3 F (36.3 C) (Oral)   Resp 20   Ht 6' 1" (1.854 m)   Wt 126.8 kg (279 lb 8.7 oz)   SpO2 92%   BMI 36.88 kg/m  Pain Assessment: CPOT   Pain Score: 0-No pain   SpO2: SpO2: 92 % O2 Device:SpO2: 92 % O2 Flow Rate: .   IO: Intake/output summary:  Intake/Output Summary (Last 24 hours) at 02-13-2016 1007 Last data filed at 13-Feb-2016 5809  Gross per 24 hour  Intake           1701.2 ml  Output             3137 ml  Net          -1435.8 ml    LBM: Last BM Date: 02/08/16 Baseline Weight: Weight: (!) 154.6 kg (340 lb 13.3 oz) Most recent weight: Weight: 126.8 kg (279 lb 8.7 oz)      Palliative Assessment/Data: 20%-intubated   Discussed with Dr Lamonte Sakai and Dr Marval Regal    Time In: 1100 Time Out: 1230 Time Total: 90 min Greater than 50%  of this time was spent counseling and coordinating care related to the above assessment and plan.  Signed by: Wadie Lessen, NP   Please contact Palliative Medicine Team phone at [phone] for questions and concerns.  For individual provider: See Shea Evans

## 2016-03-10 NOTE — Progress Notes (Signed)
CRITICAL VALUE ALERT  Critical value received:  PTT > 200  Date of notification:  02/26/2016  Time of notification:  5:54 AM   Critical value read back:Yes.    Nurse who received alert:  Mick Sell RN   MD notified (1st page):  Dr Wallis Bamberg  Time of first page:  5:55 AM   MD notified (2nd page):  Time of second page:  Responding MD:  Dr. Wallis Bamberg  Time MD responded:  5:55 AM   Patient on CRRT with heparin with ACT's

## 2016-03-10 NOTE — Accreditation Note (Signed)
o Restraints not reported to CMS Pursuant to regulation 482.13 (G) (3) use of soft wrist restraints was logged on 01.04.2018 at 1104 by Evette Cristal, RN, Patient Regulatory affairs officer.

## 2016-03-10 NOTE — Progress Notes (Signed)
Patient ID: Miguel [name], male   DOB: 1950/11/18, 66 y.o.   MRN: [id number] S: no problems with CVVHD overnight O:BP (!) 66/47   Pulse 82   Temp 97 F (36.1 C) (Oral)   Resp 20   Ht '6\' 1"'$  (1.854 m)   Wt 126.8 kg (279 lb 8.7 oz)   SpO2 94%   BMI 36.88 kg/m   Intake/Output Summary (Last 24 hours) at 2016/03/01 1209 Last data filed at 01-Mar-2016 1000  Gross per 24 hour  Intake           1626.1 ml  Output             3078 ml  Net          -1451.9 ml   Intake/Output: I/O last 3 completed shifts: In: 2968.6 [I.V.:2428.6; NG/GT:490; IV Piggyback:50] Out: 1624 [Other:5047]  Intake/Output this shift:  Total I/O In: 338.9 [I.V.:168.9; NG/GT:120; IV Piggyback:50] Out: 524 [Urine:10; Other:514] Weight change: -0.207 kg (-7.3 oz) ECX:FQHKU AAM with trach, sedated CVS:no rub Resp:scattered rhonchi VJD:YNXGZ, +BS, soft Ext:no edema, LUE AVF pulsatile, no thrill or bruit.   Recent Labs Lab 02/06/16 0400 02/06/16 1940 02/07/16 0515 02/07/16 1600 02/08/16 0500 02/08/16 1630 03/01/16 0457  NA 139 139 138 137 137 136 136  K 3.6 3.3* 4.0 3.9 3.8 4.4 4.0  CL 102 104 102 101 100* 101 99*  CO2 '23 23 24 '$ 21* '23 25 25  '$ GLUCOSE 139* 113* 181* 197* 177* 260* 174*  BUN 33* 36* 39* 35* 32* 36* 31*  CREATININE 2.21* 2.76* 2.66* 2.07* 1.92* 1.82* 1.64*  ALBUMIN 2.4* 2.5* 2.3* 2.4* 2.4* 2.3* 2.7*  CALCIUM 9.7 9.8 9.3 9.2 9.8 9.7 9.9  PHOS 4.0 4.6 5.2* 4.4 3.8 4.2 2.9   Liver Function Tests:  Recent Labs Lab 02/08/16 0500 02/08/16 1630 01-Mar-2016 0457  ALBUMIN 2.4* 2.3* 2.7*   No results for input(s): LIPASE, AMYLASE in the last 168 hours. No results for input(s): AMMONIA in the last 168 hours. CBC:  Recent Labs Lab 02/05/16 0322 02/06/16 0400 02/07/16 0516 02/07/16 1300 02/08/16 0500 03-01-2016 0457  WBC 18.3* 20.3* 20.4*  --  26.6* 25.5*  HGB 7.5* 7.3* 6.8* 8.1* 8.3* 8.4*  HCT 23.8* 23.7* 21.8* 26.0* 25.9* 27.1*  MCV 90.8 90.8 92.8  --  90.9 92.2  PLT 261 299 295  --  305  279   Cardiac Enzymes: No results for input(s): CKTOTAL, CKMB, CKMBINDEX, TROPONINI in the last 168 hours. CBG:  Recent Labs Lab 02/08/16 1940 March 01, 2016 0004 03/01/16 0418 03-01-2016 0843 01-Mar-2016 1157  GLUCAP 169* 169* 155* 188* 175*    Iron Studies: No results for input(s): IRON, TIBC, TRANSFERRIN, FERRITIN in the last 72 hours. Studies/Results: No results found. . chlorhexidine gluconate (MEDLINE KIT)  15 mL Mouth Rinse BID  . clonazePAM  0.5 mg Per Tube BID  . famotidine (PEPCID) IV  20 mg Intravenous Q24H  . feeding supplement (PRO-STAT SUGAR FREE 64)  30 mL Per Tube QID  . hydrocortisone sod succinate (SOLU-CORTEF) inj  50 mg Intravenous Q6H  . insulin aspart  0-20 Units Subcutaneous Q4H  . mouth rinse  15 mL Mouth Rinse QID  . QUEtiapine  200 mg Oral BID    BMET    Component Value Date/Time   NA 136 01-Mar-2016 0457   K 4.0 03/01/2016 0457   CL 99 (L) 2016-03-01 0457   CO2 25 03/01/2016 0457   GLUCOSE 174 (H) 01-Mar-2016 0457   BUN 31 (H) 03-01-2016 0457  CREATININE 1.64 (H) February 16, 2016 0457   CALCIUM 9.9 2016/02/16 0457   GFRNONAA 42 (L) 02-16-2016 0457   GFRAA 48 (L) 16-Feb-2016 0457   CBC    Component Value Date/Time   WBC 25.5 (H) February 16, 2016 0457   RBC 2.94 (L) 02/16/2016 0457   HGB 8.4 (L) 02-16-16 0457   HCT 27.1 (L) February 16, 2016 0457   HCT 36.3 (L) 08/24/2015 2045   PLT 279 02/16/2016 0457   MCV 92.2 Feb 16, 2016 0457   MCH 28.6 2016-02-16 0457   MCHC 31.0 2016-02-16 0457   RDW 20.1 (H) 02/16/16 0457   LYMPHSABS 1.1 01/09/2016 1056   MONOABS 0.4 01/18/2016 1056   EOSABS 0.1 01/29/2016 1056   BASOSABS 0.0 01/09/2016 1056     Assessment/Plan:  1. AKI/CKD stage 5 admitted 01/10/2016 with severe sepsis syndrome, hypotension, severe acidemia, and VDRF.  He has been on CVVHD since admission without significant improvement.    2. CVVHD Management. Electrolytes stable on current dialysate solutions (all 4K/2.5Ca). Was off of heparin due to bleeding  from trach but started clotting filters and dropping H/H due to losing rinse back. Resumed heparin 02/07/16 and doing well. 3. Vascular access- LUE AVF now pulsatile and without T/B (likely clotted).  Now with right IJ 12/26. 4. SIRS. no infectious etiology detected, completed course of zosyn.  Continues on levo and vaso.  5. VDRF- s/p trach 02/04/16 Trach without evidence of bleeding.  6. AMS- no improvement 7. Disposition- patient has not shown any signs of improvement.  Also has grim outlook with chronic vent facility with ESRD.  Palliative care to help set goals/limits of care and initiate discussions regarding EOL and possible transition to comfort measures (which is my recommendation).  Donetta Potts, MD [employer] [phone]

## 2016-03-10 DEATH — deceased

## 2016-03-19 LAB — ACID FAST CULTURE WITH REFLEXED SENSITIVITIES: ACID FAST CULTURE - AFSCU3: NEGATIVE

## 2016-03-20 ENCOUNTER — Encounter (HOSPITAL_COMMUNITY): Payer: Self-pay | Admitting: Emergency Medicine

## 2016-04-04 DIAGNOSIS — J9611 Chronic respiratory failure with hypoxia: Secondary | ICD-10-CM

## 2016-04-04 DIAGNOSIS — R4182 Altered mental status, unspecified: Secondary | ICD-10-CM

## 2016-04-07 NOTE — Discharge Summary (Signed)
PCCM death summary  Admission date: 2016/01/27  Date of death: 2016-02-12   Final cause of death: Shock, suspected septic plus cardiogenic  Secondary causes of death: Acute respiratory failure with hypercapnia and hypoxemia Bilateral pulmonary infiltrates, ARDS Possible healthcare associated pneumonia Suspected cardiogenic pulmonary edema Agitated delirium Acute toxic metabolic encephalopathy Acute on chronic (Stage V) renal failure Acute adrenal insufficiency Atrial fibrillation with rapid ventricular response Focal segmental glomerulosclerosis Hypothermia, resolved Anemia of chronic disease and of critical illness Hypertension with grade 1 diastolic dysfunction Bleeding from tracheostomy site Diabetes mellitus Hyperglycemia History of GERD History of arthritis   HPI: 66 yo male brought to ER 27-Jan-2023 with generalized weakness.  Intubated urgently for hypoxia/ hypercarbia & hypothermia 89 .He has CKD 5- biopsy proven FSGS. Chest x-ray showed bilateral pulmonary infiltrates consistent with suspected cardiogenic pulmonary edema versus ARDS or both. He was in shock, etiology unclear, soon after intubation. Was felt that there may be components of both cardiogenic and septic shock present. He also had atrial fibrillation with rapid ventricular response, started on amiodarone. He was started on CVVHD and underwent volume removal. He continued to be in shock requiring norepinephrine, vasopressin. Stress dose steroids were initiated given the possibility of adrenal insufficiency. He was treated with empiric antibiotics for possible healthcare associated pneumonia. He failed to improve significantly despite volume removal and treatment with antibiotics. Percutaneous tracheostomy was placed on 02/04/16. The hope was that this would allow sedation to be decreased, improving mental status and blood pressure. Despite all aggressive measures he failed to improve. He remains dialysis dependent and in  shock. His mental status remained poor and vent needs showed that he required high FiO2 and PEEP. Based on his lack of improvement discussions were undertaken with the family regarding the direction of his care and the poor prognosis for getting out of the intensive care unit. Based on this decision was made to transition him to a comfort-based approach. Continuous dialysis was stopped on 1/2. Pressor support was weaned to off and the patient expired on Feb 12, 2016.    Baltazar Apo, MD, PhD 03/20/2016, 11:27 PM [facility] [phone] or if no answer [phone]
